# Patient Record
Sex: Female | Born: 1965 | Race: White | Hispanic: No | Marital: Married
Health system: Midwestern US, Academic
[De-identification: ages and names within clinical notes are randomized; demographics above are authoritative.]

---

## 2017-08-10 ENCOUNTER — Encounter: Admit: 2017-08-10 | Discharge: 2017-08-11 | Payer: BC Managed Care – PPO

## 2017-08-10 DIAGNOSIS — R69 Illness, unspecified: Principal | ICD-10-CM

## 2017-08-13 ENCOUNTER — Encounter: Admit: 2017-08-13 | Discharge: 2017-08-14 | Payer: BC Managed Care – PPO

## 2017-08-13 DIAGNOSIS — R69 Illness, unspecified: Principal | ICD-10-CM

## 2017-08-16 ENCOUNTER — Encounter: Admit: 2017-08-16 | Discharge: 2017-08-17 | Payer: BC Managed Care – PPO

## 2017-08-16 DIAGNOSIS — R69 Illness, unspecified: Principal | ICD-10-CM

## 2017-10-31 ENCOUNTER — Encounter: Admit: 2017-10-31 | Discharge: 2017-10-31 | Payer: BC Managed Care – PPO

## 2017-11-20 ENCOUNTER — Encounter: Admit: 2017-11-20 | Discharge: 2017-11-20 | Payer: BC Managed Care – PPO

## 2017-11-21 ENCOUNTER — Encounter: Admit: 2017-11-21 | Discharge: 2017-11-21 | Payer: BC Managed Care – PPO

## 2017-11-22 ENCOUNTER — Encounter: Admit: 2017-11-22 | Discharge: 2017-11-22 | Payer: BC Managed Care – PPO

## 2017-11-22 DIAGNOSIS — R69 Illness, unspecified: Principal | ICD-10-CM

## 2017-11-28 ENCOUNTER — Encounter: Admit: 2017-11-28 | Discharge: 2017-11-29 | Payer: BC Managed Care – PPO

## 2017-11-28 ENCOUNTER — Encounter: Admit: 2017-11-28 | Discharge: 2017-11-28 | Payer: BC Managed Care – PPO

## 2017-11-28 DIAGNOSIS — N19 Unspecified kidney failure: ICD-10-CM

## 2017-11-28 DIAGNOSIS — I10 Essential (primary) hypertension: ICD-10-CM

## 2017-11-28 DIAGNOSIS — D472 Monoclonal gammopathy: Principal | ICD-10-CM

## 2017-11-28 DIAGNOSIS — E119 Type 2 diabetes mellitus without complications: Principal | ICD-10-CM

## 2017-11-28 DIAGNOSIS — N186 End stage renal disease: ICD-10-CM

## 2017-11-28 LAB — CBC AND DIFF
Lab: 12 g/dL — ABNORMAL HIGH (ref 12.0–15.0)
Lab: 6 K/UL (ref 4.5–11.0)

## 2017-11-28 LAB — COMPREHENSIVE METABOLIC PANEL: Lab: 136 MMOL/L — ABNORMAL LOW (ref 137–147)

## 2017-11-28 LAB — IMMUNOGLOBULINS-IGA,IGG,IGM: Lab: 133 mg/dL — ABNORMAL HIGH (ref 762–1488)

## 2017-11-28 LAB — LDH-LACTATE DEHYDROGENASE: Lab: 157 U/L (ref 100–210)

## 2017-11-29 LAB — KAPPA/LAMBDA FREE LIGHT CHAINS: Lab: 12 mg/dL — ABNORMAL HIGH (ref 0.33–1.94)

## 2017-11-29 LAB — BETA 2 MICROGLOBULIN: Lab: 11 mg/L — ABNORMAL HIGH (ref 0.8–2.3)

## 2017-11-30 LAB — IMMUNOFIXATION, SERUM (IFES)

## 2017-11-30 LAB — ELECTROPHORESIS-SERUM PROTEIN: Lab: 7.2 g/dL (ref 6.0–8.0)

## 2017-12-05 ENCOUNTER — Encounter: Admit: 2017-12-05 | Discharge: 2017-12-05 | Payer: BC Managed Care – PPO

## 2017-12-05 DIAGNOSIS — D472 Monoclonal gammopathy: Principal | ICD-10-CM

## 2017-12-05 LAB — URINE COLLECTION
Lab: 25 pg (ref 26–34)
Lab: 800 mL (ref 32.0–36.0)

## 2017-12-06 ENCOUNTER — Encounter: Admit: 2017-12-06 | Discharge: 2017-12-06 | Payer: BC Managed Care – PPO

## 2017-12-07 LAB — ELECTROPHORESIS-UR 24 HR
Lab: 11 %
Lab: 13 %
Lab: 20 %
Lab: 27 %
Lab: 29 %
Lab: 39 mg/dL

## 2017-12-07 LAB — IMMUNOFIXATION URINE 24 HOUR

## 2017-12-10 ENCOUNTER — Encounter: Admit: 2017-12-10 | Discharge: 2017-12-10 | Payer: BC Managed Care – PPO

## 2017-12-10 DIAGNOSIS — N19 Unspecified kidney failure: ICD-10-CM

## 2017-12-10 DIAGNOSIS — E119 Type 2 diabetes mellitus without complications: Principal | ICD-10-CM

## 2017-12-10 DIAGNOSIS — D472 Monoclonal gammopathy: ICD-10-CM

## 2017-12-10 DIAGNOSIS — N186 End stage renal disease: ICD-10-CM

## 2017-12-10 DIAGNOSIS — I10 Essential (primary) hypertension: ICD-10-CM

## 2017-12-20 ENCOUNTER — Encounter: Admit: 2017-12-20 | Discharge: 2017-12-20 | Payer: BC Managed Care – PPO

## 2017-12-28 ENCOUNTER — Encounter: Admit: 2017-12-28 | Discharge: 2017-12-28 | Payer: BC Managed Care – PPO

## 2018-01-15 ENCOUNTER — Encounter: Admit: 2018-01-15 | Discharge: 2018-01-15 | Payer: BC Managed Care – PPO

## 2018-01-15 DIAGNOSIS — N186 End stage renal disease: ICD-10-CM

## 2018-01-15 DIAGNOSIS — D472 Monoclonal gammopathy: ICD-10-CM

## 2018-01-15 DIAGNOSIS — E119 Type 2 diabetes mellitus without complications: Principal | ICD-10-CM

## 2018-01-15 DIAGNOSIS — N19 Unspecified kidney failure: ICD-10-CM

## 2018-01-15 DIAGNOSIS — I10 Essential (primary) hypertension: ICD-10-CM

## 2018-01-15 DIAGNOSIS — E1121 Type 2 diabetes mellitus with diabetic nephropathy: ICD-10-CM

## 2018-01-22 ENCOUNTER — Encounter: Admit: 2018-01-22 | Discharge: 2018-01-22 | Payer: BC Managed Care – PPO

## 2018-01-23 ENCOUNTER — Encounter: Admit: 2018-01-23 | Discharge: 2018-01-23 | Payer: BC Managed Care – PPO

## 2018-01-29 ENCOUNTER — Encounter: Admit: 2018-01-29 | Discharge: 2018-01-29 | Payer: BC Managed Care – PPO

## 2018-03-08 ENCOUNTER — Encounter: Admit: 2018-03-08 | Discharge: 2018-03-08 | Payer: BC Managed Care – PPO

## 2018-03-21 ENCOUNTER — Encounter: Admit: 2018-03-21 | Discharge: 2018-03-21 | Payer: BC Managed Care – PPO

## 2018-03-21 DIAGNOSIS — Z01818 Encounter for other preprocedural examination: Principal | ICD-10-CM

## 2018-03-23 ENCOUNTER — Encounter: Admit: 2018-03-23 | Discharge: 2018-03-23 | Payer: BC Managed Care – PPO

## 2018-03-26 ENCOUNTER — Encounter: Admit: 2018-03-26 | Discharge: 2018-03-26 | Payer: BC Managed Care – PPO

## 2018-04-05 ENCOUNTER — Encounter: Admit: 2018-04-05 | Discharge: 2018-04-05 | Payer: BC Managed Care – PPO

## 2018-04-09 ENCOUNTER — Ambulatory Visit: Admit: 2018-04-09 | Discharge: 2018-04-09 | Payer: BC Managed Care – PPO

## 2018-04-09 ENCOUNTER — Encounter: Admit: 2018-04-09 | Discharge: 2018-04-09 | Payer: BC Managed Care – PPO

## 2018-04-09 ENCOUNTER — Ambulatory Visit: Admit: 2018-04-09 | Discharge: 2018-04-10 | Payer: BC Managed Care – PPO

## 2018-04-09 DIAGNOSIS — R897 Abnormal histological findings in specimens from other organs, systems and tissues: ICD-10-CM

## 2018-04-09 DIAGNOSIS — Z01818 Encounter for other preprocedural examination: Principal | ICD-10-CM

## 2018-04-09 DIAGNOSIS — D472 Monoclonal gammopathy: ICD-10-CM

## 2018-04-09 DIAGNOSIS — E119 Type 2 diabetes mellitus without complications: Principal | ICD-10-CM

## 2018-04-09 DIAGNOSIS — E0821 Diabetes mellitus due to underlying condition with diabetic nephropathy: Secondary | ICD-10-CM

## 2018-04-09 DIAGNOSIS — N19 Unspecified kidney failure: ICD-10-CM

## 2018-04-09 DIAGNOSIS — N186 End stage renal disease: Principal | ICD-10-CM

## 2018-04-09 DIAGNOSIS — I10 Essential (primary) hypertension: ICD-10-CM

## 2018-04-09 DIAGNOSIS — E1122 Type 2 diabetes mellitus with diabetic chronic kidney disease: ICD-10-CM

## 2018-04-09 DIAGNOSIS — Z794 Long term (current) use of insulin: Secondary | ICD-10-CM

## 2018-04-09 DIAGNOSIS — R809 Proteinuria, unspecified: ICD-10-CM

## 2018-04-09 DIAGNOSIS — D649 Anemia, unspecified: ICD-10-CM

## 2018-04-09 LAB — URINALYSIS DIPSTICK
Lab: 1 (ref 1.003–1.035)
Lab: 5 (ref 5.0–8.0)
Lab: NEGATIVE
Lab: NEGATIVE
Lab: NEGATIVE
Lab: NEGATIVE
Lab: POSITIVE — AB

## 2018-04-09 LAB — HEPATITIS C ANTIBODY W REFLEX HCV PCR QUANT: Lab: NEGATIVE mL/min — ABNORMAL LOW (ref 0–0.45)

## 2018-04-09 LAB — CBC AND DIFF
Lab: 0.1 10*3/uL (ref 0–0.20)
Lab: 0.2 10*3/uL (ref 0–0.45)
Lab: 0.6 10*3/uL (ref 0–0.80)
Lab: 12 g/dL (ref 12.0–15.0)
Lab: 14 % (ref 11–15)
Lab: 2 % (ref 0–5)
Lab: 2.6 10*3/uL (ref 1.0–4.8)
Lab: 3.9 M/UL — ABNORMAL LOW (ref 4.0–5.0)
Lab: 30 % (ref 24–44)
Lab: 32 pg (ref 26–34)
Lab: 34 g/dL (ref 32.0–36.0)
Lab: 37 % (ref 36–45)
Lab: 5.3 10*3/uL (ref 1.8–7.0)
Lab: 8.8 10*3/uL (ref 4.5–11.0)
Lab: 9.2 FL — ABNORMAL LOW (ref >60)
Lab: 94 FL (ref 80–100)

## 2018-04-09 LAB — COMPREHENSIVE METABOLIC PANEL
Lab: 101 mg/dL — ABNORMAL HIGH (ref 70–100)
Lab: 138 MMOL/L (ref 137–147)
Lab: 3.3 mg/dL — ABNORMAL HIGH (ref 0.4–1.00)
Lab: 3.4 MMOL/L — ABNORMAL LOW (ref 3.5–5.1)

## 2018-04-09 LAB — AMPHETAMINES-URINE RANDOM: Lab: NEGATIVE

## 2018-04-09 LAB — PHOSPHORUS: Lab: 3.5 mg/dL (ref 2.0–4.5)

## 2018-04-09 LAB — COCAINE-URINE RANDOM: Lab: NEGATIVE

## 2018-04-09 LAB — HEPATITIS B SURFACE AG: Lab: NEGATIVE

## 2018-04-09 LAB — OPIATES-URINE RANDOM: Lab: NEGATIVE

## 2018-04-09 LAB — CANNABINOIDS-URINE RANDOM: Lab: NEGATIVE

## 2018-04-09 LAB — URINALYSIS, MICROSCOPIC

## 2018-04-09 LAB — HEPATITIS B SURFACE AB

## 2018-04-09 LAB — HIV 1& 2 AG-AB SCRN W REFLEX HIV 1 PCR QUANT: Lab: NEGATIVE U/L (ref 7–56)

## 2018-04-09 LAB — TOXOPLASMA IGM: Lab: NEGATIVE % (ref 4–12)

## 2018-04-09 LAB — GGTP: Lab: 13 U/L (ref 9–64)

## 2018-04-09 LAB — BARBITURATES-URINE RANDOM: Lab: NEGATIVE

## 2018-04-09 LAB — BENZODIAZEPINES-URINE RANDOM: Lab: NEGATIVE

## 2018-04-09 LAB — PHENCYCLIDINES-URINE RANDOM: Lab: NEGATIVE

## 2018-04-09 LAB — TOXOPLASMA IGG: Lab: NEGATIVE % — ABNORMAL LOW (ref >60)

## 2018-04-09 LAB — HEPATITIS B CORE AB TOT (IGG+IGM): Lab: NEGATIVE

## 2018-04-09 LAB — PROTIME INR (PT): Lab: 1 g/dL (ref 0.8–1.2)

## 2018-04-10 ENCOUNTER — Encounter: Admit: 2018-04-10 | Discharge: 2018-04-10 | Payer: BC Managed Care – PPO

## 2018-04-10 DIAGNOSIS — N186 End stage renal disease: Principal | ICD-10-CM

## 2018-04-10 LAB — CMV AB IGG: Lab: NEGATIVE mg/dL — ABNORMAL HIGH (ref 7–25)

## 2018-04-10 LAB — CMV AB IGM: Lab: NEGATIVE MMOL/L (ref 98–110)

## 2018-04-10 LAB — SYPHILIS AB SCREEN: Lab: NEGATIVE K/UL (ref 150–400)

## 2018-04-10 LAB — EPSTEIN BARR  PANEL(EBV)
Lab: POSITIVE
Lab: POSITIVE

## 2018-04-10 LAB — HERPES SIMPLEX IGG AB (HSV IGG)
Lab: NEGATIVE MMOL/L — ABNORMAL HIGH (ref 21–30)
Lab: POSITIVE U/L — AB (ref 7–40)

## 2018-04-11 ENCOUNTER — Encounter: Admit: 2018-04-11 | Discharge: 2018-04-11 | Payer: BC Managed Care – PPO

## 2018-04-11 DIAGNOSIS — D649 Anemia, unspecified: ICD-10-CM

## 2018-04-11 DIAGNOSIS — N19 Unspecified kidney failure: ICD-10-CM

## 2018-04-11 DIAGNOSIS — N186 End stage renal disease: ICD-10-CM

## 2018-04-11 DIAGNOSIS — E119 Type 2 diabetes mellitus without complications: Principal | ICD-10-CM

## 2018-04-11 DIAGNOSIS — R897 Abnormal histological findings in specimens from other organs, systems and tissues: ICD-10-CM

## 2018-04-11 DIAGNOSIS — R809 Proteinuria, unspecified: ICD-10-CM

## 2018-04-11 DIAGNOSIS — D472 Monoclonal gammopathy: ICD-10-CM

## 2018-04-11 DIAGNOSIS — I10 Essential (primary) hypertension: ICD-10-CM

## 2018-04-11 LAB — HEMOGLOBIN A1C: Lab: 10 % — ABNORMAL HIGH (ref 4.0–6.0)

## 2018-04-12 ENCOUNTER — Encounter: Admit: 2018-04-12 | Discharge: 2018-04-12 | Payer: BC Managed Care – PPO

## 2018-04-18 ENCOUNTER — Encounter: Admit: 2018-04-18 | Discharge: 2018-04-18 | Payer: BC Managed Care – PPO

## 2018-04-19 ENCOUNTER — Encounter: Admit: 2018-04-19 | Discharge: 2018-04-19 | Payer: BC Managed Care – PPO

## 2018-04-24 ENCOUNTER — Encounter: Admit: 2018-04-24 | Discharge: 2018-04-24 | Payer: BC Managed Care – PPO

## 2018-05-10 ENCOUNTER — Encounter: Admit: 2018-05-10 | Discharge: 2018-05-10 | Payer: BC Managed Care – PPO

## 2018-05-15 ENCOUNTER — Encounter: Admit: 2018-05-15 | Discharge: 2018-05-15 | Payer: BC Managed Care – PPO

## 2018-06-01 ENCOUNTER — Encounter: Admit: 2018-06-01 | Discharge: 2018-06-01 | Payer: BC Managed Care – PPO

## 2018-06-13 ENCOUNTER — Encounter: Admit: 2018-06-13 | Discharge: 2018-06-13 | Payer: BC Managed Care – PPO

## 2018-06-20 ENCOUNTER — Encounter: Admit: 2018-06-20 | Discharge: 2018-06-20 | Payer: BC Managed Care – PPO

## 2018-06-22 ENCOUNTER — Encounter: Admit: 2018-06-22 | Discharge: 2018-06-22 | Payer: BC Managed Care – PPO

## 2018-06-25 ENCOUNTER — Encounter: Admit: 2018-06-25 | Discharge: 2018-06-25 | Payer: BC Managed Care – PPO

## 2018-06-26 ENCOUNTER — Encounter: Admit: 2018-06-26 | Discharge: 2018-06-26 | Payer: BC Managed Care – PPO

## 2018-06-27 ENCOUNTER — Encounter: Admit: 2018-06-27 | Discharge: 2018-06-27 | Payer: BC Managed Care – PPO

## 2018-07-02 ENCOUNTER — Encounter: Admit: 2018-07-02 | Discharge: 2018-07-02 | Payer: BC Managed Care – PPO

## 2018-07-03 ENCOUNTER — Encounter: Admit: 2018-07-03 | Discharge: 2018-07-03 | Payer: BC Managed Care – PPO

## 2018-07-05 ENCOUNTER — Encounter: Admit: 2018-07-05 | Discharge: 2018-07-05 | Payer: BC Managed Care – PPO

## 2018-07-10 ENCOUNTER — Encounter: Admit: 2018-07-10 | Discharge: 2018-07-10 | Payer: BC Managed Care – PPO

## 2018-07-11 ENCOUNTER — Encounter: Admit: 2018-07-11 | Discharge: 2018-07-11 | Payer: BC Managed Care – PPO

## 2018-07-19 ENCOUNTER — Encounter: Admit: 2018-07-19 | Discharge: 2018-07-19 | Payer: BC Managed Care – PPO

## 2018-07-20 ENCOUNTER — Encounter: Admit: 2018-07-20 | Discharge: 2018-07-20 | Payer: BC Managed Care – PPO

## 2018-07-26 ENCOUNTER — Ambulatory Visit: Admit: 2018-07-26 | Discharge: 2018-07-27 | Payer: BC Managed Care – PPO

## 2018-07-27 ENCOUNTER — Encounter: Admit: 2018-07-27 | Discharge: 2018-07-27 | Payer: BC Managed Care – PPO

## 2018-07-27 DIAGNOSIS — N186 End stage renal disease: Principal | ICD-10-CM

## 2018-07-31 ENCOUNTER — Encounter: Admit: 2018-07-31 | Discharge: 2018-07-31 | Payer: BC Managed Care – PPO

## 2018-09-13 ENCOUNTER — Encounter: Admit: 2018-09-13 | Discharge: 2018-09-13 | Payer: BC Managed Care – PPO

## 2018-10-25 ENCOUNTER — Encounter: Admit: 2018-10-25 | Discharge: 2018-10-25 | Payer: BC Managed Care – PPO

## 2018-11-22 ENCOUNTER — Encounter: Admit: 2018-11-22 | Discharge: 2018-11-22 | Payer: BC Managed Care – PPO

## 2018-11-22 NOTE — Telephone Encounter
TRANSPLANT BENEFIT COLLECTION:  As of 11/22/18 Patient does not have Medicare per WPS website    Verified by: Leodis Rains      Date: November 22, 2018  Spoke to: 2020 Benefits compared to 2019 Benefits no changes via [employer] called for RX Benefit  ID #: [id number]   GR#: [id number]    Subscriber:Spouse  Ins Plan:[place]    EFF:06/01/18    Phone#:[phone] Plan Type:PPO  Deductible:???$1000(Met)??????????????????  Co-ins:???10%????????????????????????????????????  Out of Pocket:???$2500 (Med/RX)(Met)  Inpt Copay:???Sub to Co-Ins  Outpt Copay:???Sub to Co-Ins  OV PCP/Spec Copay:???$30 / $45  Additional Benefit Limits:???[place] Is In-Network NO BDCT is required  Plan limitations/concerns: N/A  Passport Onesource Ref#:  [id number]    Donor Benefits  Max Donor Benefits:???Covered  Travel and Lodging for recipient.???None  Donor Travel and Lodging for Donor:???None  ???  RX Plan:BCBS / Express Scripts????????????Phone #:???[phone]  Spoke to: Janeece          Call Reference#: [id number]  30 day retail cost:???$15 / $30 / $50  90 day m/o cost:???$37.50 / $75 / $125  Valcyte:???$50 / 90day m/o $125  Generic:???$15 / 90day m/o $37.50  Envarsus XR:???$50 / 90day m/o $125  RX Ded:???$0  RX OOP:???$2500 Rx/Med(Met)  ???  TXP Network:[employer] [place]  NCM:???Eustace Pen  Phone #:???[phone]??????????????????FAX #:???[phone]  Auth requirements:No Auth Needed for Eval. Written Auth Required for Listing  ???

## 2019-03-18 ENCOUNTER — Ambulatory Visit: Admit: 2019-03-18 | Discharge: 2019-03-19 | Payer: BC Managed Care – PPO

## 2019-03-19 DIAGNOSIS — N186 End stage renal disease: Principal | ICD-10-CM

## 2019-03-22 ENCOUNTER — Encounter: Admit: 2019-03-22 | Discharge: 2019-03-22

## 2019-04-15 ENCOUNTER — Encounter: Admit: 2019-04-15 | Discharge: 2019-04-15 | Payer: BC Managed Care – PPO

## 2019-04-15 NOTE — Telephone Encounter
Called pt to try and reschedule her appt no answer left message for pt to call back.

## 2019-04-22 ENCOUNTER — Encounter: Admit: 2019-04-22 | Discharge: 2019-04-22 | Payer: BC Managed Care – PPO

## 2019-04-22 NOTE — Telephone Encounter
Patient being seen in clinic for update visit on 04/30/2019, EKG and CT abdomen/pelvis ordered.

## 2019-04-30 ENCOUNTER — Encounter

## 2019-04-30 DIAGNOSIS — D649 Anemia, unspecified: Secondary | ICD-10-CM

## 2019-04-30 DIAGNOSIS — E119 Type 2 diabetes mellitus without complications: Secondary | ICD-10-CM

## 2019-04-30 DIAGNOSIS — Z01818 Encounter for other preprocedural examination: Secondary | ICD-10-CM

## 2019-04-30 DIAGNOSIS — R897 Abnormal histological findings in specimens from other organs, systems and tissues: Secondary | ICD-10-CM

## 2019-04-30 DIAGNOSIS — N186 End stage renal disease: Secondary | ICD-10-CM

## 2019-04-30 DIAGNOSIS — R809 Proteinuria, unspecified: Secondary | ICD-10-CM

## 2019-04-30 DIAGNOSIS — I10 Essential (primary) hypertension: Secondary | ICD-10-CM

## 2019-04-30 DIAGNOSIS — N19 Unspecified kidney failure: Secondary | ICD-10-CM

## 2019-04-30 DIAGNOSIS — D472 Monoclonal gammopathy: Secondary | ICD-10-CM

## 2019-04-30 LAB — CBC AND DIFF
Lab: 0 10*3/uL (ref 0–0.20)
Lab: 0.2 10*3/uL (ref 0–0.45)
Lab: 0.6 10*3/uL (ref 0–0.80)
Lab: 1 % (ref 0–2)
Lab: 10 10*3/uL (ref 4.5–11.0)
Lab: 12 g/dL (ref 12.0–15.0)
Lab: 13 % (ref 11–15)
Lab: 2 % (ref 0–5)
Lab: 2.8 10*3/uL (ref 1.0–4.8)
Lab: 228 10*3/uL (ref 150–400)
Lab: 27 % (ref 24–44)
Lab: 29 pg (ref 26–34)
Lab: 34 g/dL — ABNORMAL HIGH (ref 32.0–36.0)
Lab: 37 % (ref 36–45)
Lab: 4.4 M/UL (ref 4.0–5.0)
Lab: 6 % (ref 4–12)
Lab: 6.8 10*3/uL (ref 1.8–7.0)
Lab: 64 % — ABNORMAL LOW (ref >60)
Lab: 85 FL (ref 80–100)
Lab: 9.2 FL — ABNORMAL LOW (ref >60)

## 2019-04-30 LAB — COMPREHENSIVE METABOLIC PANEL
Lab: 100 mg/dL (ref 70–100)
Lab: 138 MMOL/L (ref 137–147)
Lab: 2.7 mg/dL — ABNORMAL HIGH (ref 0.4–1.00)
Lab: 3.1 MMOL/L — ABNORMAL LOW (ref 3.5–5.1)

## 2019-04-30 LAB — URINALYSIS DIPSTICK
Lab: NEGATIVE
Lab: NEGATIVE
Lab: NEGATIVE
Lab: NEGATIVE

## 2019-04-30 LAB — HIV 1& 2 AG-AB SCRN W REFLEX HIV 1 PCR QUANT

## 2019-04-30 LAB — PROTEIN/CR RATIO,UR RAN
Lab: 0.1
Lab: 15 mg/dL
Lab: 155 mg/dL — AB (ref 0–5)

## 2019-04-30 LAB — HEPATITIS B SURFACE AG

## 2019-04-30 LAB — PHOSPHORUS: Lab: 3.4 mg/dL (ref 2.0–4.5)

## 2019-04-30 LAB — URINALYSIS, MICROSCOPIC

## 2019-04-30 LAB — TOXOPLASMA IGM: Lab: NEGATIVE

## 2019-04-30 LAB — GGTP: Lab: 34 U/L (ref 9–64)

## 2019-04-30 LAB — PROTIME INR (PT): Lab: 1.1 (ref 0.8–1.2)

## 2019-04-30 LAB — TOXOPLASMA IGG: Lab: NEGATIVE

## 2019-04-30 NOTE — Progress Notes
Kidney Transplant Nutrition Evaluation    Impressions: Patient has uncontrolled blood sugars with a HbgA1c of 9.3% and am blood glucose between 200-400mg /dL. Otherwise, I see no nutritional contraindications to transplantation at this time. Pt has an appropriate body habitus for RTx.   BMI Readings from Last 1 Encounters:   04/30/19 36.51 kg/m?                                                Nutrition Assessment of Patient:  Jacqueline Shields is a 53 y.o. 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 Spoke with spouse during transplant evaluation today.      Weight History:  240 pounds, stable  Dialysis:  Peritoneal Dialysis   Dialysis since 08/2017, PD since May 2019  Diabetes:  Type 2 Diabetes  Checks blood sugars once a day in the morning, they range between 200-400mg /dL. She thinks her last HbgA1c is 9.5%  Diet Recall:   Diet coke and 2 sugar free jello, dinner was mini tacos and sprite. Occasionally she will have bananas and jello for breakfast.   Fluid Intake:   Diet coke, sprite, water. Typically goes over her fluid restriction daily  GI symptoms:  Nausea in the monring  Pertinent Meds/Supplements:   n/a    Wt Readings from Last 5 Encounters:   04/30/19 108.9 kg (240 lb 1.6 oz)   04/09/18 109 kg (240 lb 3.2 oz)   04/09/18 109 kg (240 lb 3.2 oz)   01/15/18 110.5 kg (243 lb 9.6 oz)   11/28/17 101.9 kg (224 lb 9.6 oz)         Pertinent Labs:   Phosphorus   Date Value Ref Range Status   04/09/2018 3.5 2.0 - 4.5 MG/DL Final     Comment:     NOTE NEW REFERENCE RANGES     Lab Results   Component Value Date/Time    HGBA1C 10.3 (H) 04/09/2018 04:04 PM         Nutrition Monitoring/Education and Evaluation:  Diet Compliance: Needs improvement  Education: Instructed on consistent CHO diet. Recommended pt consume 60g CHO/meal * 3 meals/day. Suggested pt avoid snacking between meals. Reviewed 3 classes of nutrients: CHO, fats, protein.  Encouraged pt to choose non-starchy vegetables with as many meals/day as possible. With regards to [facility], recommended 100% whole grains, high fiber starches, low-fat/fat-free dairy products and whole fruit. Encouraged pt to avoid fruit juice and soda pop . Stressed importance of portion management when choosing carbohydrates. Handouts provided detailing recommendations. Pt agreeable to suggested dietary changes. Anticipate poor compliance.      Recommendations: Glycemic Control    Instructed on post-transplant nutrition related expectations. Discussed importance of hand hygiene, food safety, grapefruit avoidance.      Lajuana Carry, MS, RD, CSR, LD    Office: [phone]   [place]: [phone]

## 2019-04-30 NOTE — Progress Notes
[facility]NUAL Recipient Evaluation [facility]    Date of Service: 04/30/19    Jacqueline Shields  [id number]  1966/06/22    Referring Nephrologist:  Elmer Bales  [address]  [place] [address]  Phone: [phone]  Fax: [phone]     Dear Dr. Elmer Bales    We had the pleasure of meeting Jacqueline Shields in the [facility] for her ANNUAL evaluation for continued candidacy for renal transplantation.    She is a 53 y.o. 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     She was initially evaluated on 04/09/18 and has been listed for transplant since 11/10/17 backdated for dialysis.     She was last seen for evaluation at our transplant center during initial eval, at that time noted to be doing well.     Since then Doing well. No issues or concerns.    She denies hospitalizations, illnesses, fevers, chills, n/v/d, chest pains, sob, abd pains, swelling, rash, dysuria, hematuria. Overall feeling well.    DIALYSIS HISTORY:  ESRD diagnosis: DM  Dialysis Initiation Date: Jan 2019  Modality: PD  Prescription: 9.5 hrs, FV , No last fill.   PD Complications: None   Requires Midodrine: No    DIABETES HISTORY:  Age at diagnosis: ~29  Current HgA1C: 9.5 in June 2020  Historical HgA1C: 10-12s  Complications:  --- denies retinopathy  --- reports peripheral neuropathy  --- denies gastroparesis  --- denies hx of DKA  --- denies delayed healing of foot ulcers  --- denies hypoglycemic unawareness  --- denies amputations    From prior note:  She was diagnosed with ESRD in January 2019 after presenting to the hospital with flu like symptoms and was found to have a serum Creatinine of >12 mg/dl. She did follow with a PCP on a regular basis and does not recall being told that she had declined function but did have proteinuria in 2018.     Personal history:   CVA: denies Neurologic disease: denies migraines, seizure disorder, depression or anxiety  Coronary artery or other heart disease: None  Lung disease: denies history of asthma or COPD  Sleep Apnea: denies symptoms consistent with OSA.  Liver disease: None  Autoimmune disease: denies joint pains/swelling or rash consistent with rheumatologic diseases  Hematologic disorders: denies history of DVT/PEs or bleeding diathesis   PVD: denies symptoms consistent with peripheral vascular disease  Cancer: No history of cancers  Infections: No history of recurrent infections  H/o Kidney stones: Yes requiring lithotripsy in 2018. One in 2019   - follows with urology.  Activity/Exercise: works full time.   NSAID/Narcotic use: None  Native UOP: 500 cc /day  Neck ROM: normal range of motion  Sensitizations: 2 pregnancies, 1 transfusions 2018 during hospitalization when ESRD found.   Currently or Previously listed for transplant at another institution: No    Tobacco: None  Etoh None  Drug abuse: None  Travel outside of [place]: None    Fam Hx of CKD/ESRD: None    REVIEW OF SYSTEMS: Comprehensive 14-point ROS reviewed  Positives noted in HPI otherwise negative.    Allergies:   No Known Allergies    Past Medical History:  Medical History:   Diagnosis Date   ? Abnormal biopsy of kidney    ? Anemia    ? Diabetes mellitus (HCC)    ?  ESRD (end stage renal disease) (HCC)    ? Hypertension    ? Kidney failure    ? MGUS (monoclonal gammopathy of unknown significance)    ? Proteinuria        Social History:  Social History     Socioeconomic History   ? Marital status: Married     Spouse name: Not on file   ? Number of children: Not on file   ? Years of education: Not on file   ? Highest education level: Not on file   Occupational History   ? Not on file   Tobacco Use   ? Smoking status: Never Smoker   ? Smokeless tobacco: Never Used   Substance and Sexual Activity   ? Alcohol use: Not Currently     Frequency: Never   ? Drug use: Never ? Sexual activity: Not on file   Other Topics Concern   ? Not on file   Social History Narrative   ? Not on file       Surgical History:  Surgical History:   Procedure Laterality Date   ? CATHETER IMPLANT/REVISION      PD cath   ? HX HYSTERECTOMY     ? HX LITHOTRIPSY       Family History:  Family History   Problem Relation Age of Onset   ? Diabetes Mother    ? Hypertension Mother    ? Diabetes Father    ? Hypertension Father    ? Cancer-Hematologic Father    ? Diabetes Sister    ? Migraines Sister    ? Cancer Sister         Endometrial       Problem List:  Patient Active Problem List    Diagnosis Date Noted   ? 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 Hypertension    ? ESRD (end stage renal disease) (HCC)    ? Diabetes mellitus (HCC)        Current Medications:    Current Outpatient Medications:   ?  aspirin EC 81 mg tablet, Take 81 mg by mouth daily. Take with food., Disp: , Rfl:   ?  calcium acetate (PHOSLO) 667 mg capsule, Take 667 mg by mouth three times daily., Disp: , Rfl: 11  ?  fenofibrate micronized (LOFIBRA) 134 mg capsule, Take 134 mg by mouth daily., Disp: , Rfl: 1  ?  gentamicin 0.1 % topical cream, Apply  topically to affected area daily as needed., Disp: , Rfl:   ?  insulin glargine (LANTUS) 100 unit/mL injection, Inject 90 Units under the skin daily., Disp: , Rfl:   ?  metoprolol XL (TOPROL XL) 100 mg extended release tablet, Take 100 mg by mouth daily., Disp: , Rfl: 11  ?  nateglinide (STARLIX) 60 mg tablet, Take 60 mg by mouth three times daily before meals., Disp: , Rfl: 3  ?  NIFEdipine SR (PROCARDIA-XL; ADALAT CC) 60 mg tablet, Take 60 mg by mouth daily., Disp: , Rfl: 0  ?  TRULICITY 1.5 mg/0.5 mL injection pen, Inject 1.5 mg under the skin every 7 days. Saturday, Disp: , Rfl: 1    Physical Exam:  BP 102/78 (BP Source: Arm, Right Upper, Patient Position: Standing)  - Pulse 76  - Ht 172.7 cm (68)  - Wt 108.9 kg (240 lb 1.6 oz)  - LMP  (LMP Unknown)  - SpO2 100%  - BMI 36.51 kg/m?   Body mass index is 36.51  kg/m?.  General: In NAD; A&Ox3, well appearing  Skin: Warm, dry, no signs of rash   HEENT: Grossly normal appearing; EOMI; dentition good  Neck: No bruits  CV: Regular, regular rate, normal S1/S2, no murmurs  Lungs: CTA bilaterally in posterior fields  Abd: Grossly normal without rebound, guarding, masses, or bruits  Ext: No edema; femoral pulse 1-2+ and symmetric  Neuro: No focal deficits   Psych: Affect appropriate  Dialysis Access: PD catheter looks good     Laboratory studies:   CMP:  CMP Latest Ref Rng & Units 04/09/2018 11/28/2017   NA 137 - 147 MMOL/L 138 136(L)   K 3.5 - 5.1 MMOL/L 3.4(L) 3.7   CL 98 - 110 MMOL/L 99 100   CO2 21 - 30 MMOL/L 30 29   GAP 3 - 12 9 7    BUN 7 - 25 MG/DL 16(X) 09(U)   CR 0.4 - 1.00 MG/DL 0.45(W) 0.98(J)   GLUX 70 - 100 MG/DL 191(Y) 782(N)   CA 8.5 - 10.6 MG/DL 9.4 9.3   TP 6.0 - 8.0 G/DL 7.8 7.4   ALB 3.5 - 5.0 G/DL 4.1 4.0   ALKP 25 - 562 U/L 53 42   ALT 7 - 56 U/L 14 9   TBILI 0.3 - 1.2 MG/DL 0.4 0.4   GFR >13 mL/min 14(L) 10(L)   GFRAA >60 mL/min 17(L) 12(L)     CBC with Diff:  CBC with Diff Latest Ref Rng & Units 04/09/2018 11/28/2017   WBC 4.5 - 11.0 K/UL 8.8 6.0   RBC 4.0 - 5.0 M/UL 3.97(L) 4.24   HGB 12.0 - 15.0 GM/DL 08.6 57.8   HCT 36 - 45 % 37.4 38.1   MCV 80 - 100 FL 94.2 89.8   MCH 26 - 34 PG 32.2 29.7   MCHC 32.0 - 36.0 G/DL 46.9 62.9   RDW 11 - 15 % 14.8 15.7(H)   PLT 150 - 400 K/UL 317 282   MPV 7 - 11 FL 9.2 8.8   NEUT 41 - 77 % 60 54   ANC 1.8 - 7.0 K/UL 5.30 3.30   LYMA 24 - 44 % 30 34   ALYM 1.0 - 4.8 K/UL 2.60 2.00   MONA 4 - 12 % 7 7   AMONO 0 - 0.80 K/UL 0.60 0.40   EOSA 0 - 5 % 2 4   AEOS 0 - 0.45 K/UL 0.20 0.20   BASA 0 - 2 % 1 1   ABAS 0 - 0.20 K/UL 0.10 0.10       Urine    Lab Results   Component Value Date/Time    UCOLOR YELLOW 04/09/2018 04:24 PM    TURBID CLEAR 04/09/2018 04:24 PM    USPGR 1.015 04/09/2018 04:24 PM    UPH 5.0 04/09/2018 04:24 PM    UAGLU 3+ (A) 04/09/2018 04:24 PM UPROTEIN 1+ (A) 04/09/2018 04:24 PM    UBLD NEG 04/09/2018 04:24 PM    URBC 0-2 04/09/2018 04:24 PM    Lab Results   Component Value Date/Time    UWBC 10-20 04/09/2018 04:24 PM    UNIT NEG 04/09/2018 04:24 PM    ULEU TRACE (A) 04/09/2018 04:24 PM    UKET NEG 04/09/2018 04:24 PM    UBILE NEG 04/09/2018 04:24 PM    UROB NORMAL 04/09/2018 04:24 PM        Other Common Labs:  Other Common Labs Latest Ref Rng & Units 04/09/2018   PO4 2.0 -  4.5 MG/DL 3.5   UPRO NEG-NEG 1+(A)   HBA1C 4.0 - 6.0 % 10.3(H)     Viral Serologies:  Viral Serologies Latest Ref Rng & Units 04/09/2018   CMV IgG - NEG   CMV IgM NEG-NEG NEG   EBV Capsid IgG - POS   EBV Capsid IgM NEG-NEG NEG   EBV Nuclear AG, AB - POS   EBV Early AG,AB - POS   HSV Type 2 IgG NEG-NEG NEG   HIV 1 & 2 AG, AB NEG-NEG NEG       Imaging:     Results for orders placed during the hospital encounter of 04/09/18   CHEST 2 VIEWS    Impression No acute cardiopulmonary process.       Finalized by Delmar Landau, M.D. on 04/09/2018 4:32 PM. Dictated by Delmar Landau, M.D. on 04/09/2018 4:30 PM.         Assessment and Plan:  Jacqueline Shields is a 53 y.o. who appears stated age Caucasian female presents today for her ongoing evaluation for continued candidacy for renal transplantation. She blood type is A POS. Last cPRA 0% on 07/10/18. She has had 0 offers since listing.    Ultimately, I believe that she is a(n) reasonable candidate for transplantation. Her PMH is sig for ESRD on PD 2/2 DM2 bx proven and recurrent renal stones last 2 years ago. Medically, she carries no known absolute contraindications for transplant such as active cancer, infection or substance use. She has no known significant CAD, reduced EF or compromising valvular disease which were last evaluated by Echo in Jan 2019 and Stress in Jan 2019. In view of transplant evaluation, her interview and physical examination were remarkable for central obesity. At the time of this note, the available electronic medical records for review are unremarkable other than what is noted. In review of the available results/laboratories from our clinic visit, they are pending.    At this time, she has the following potential living donors: None.    Potential barriers for transplantation that include:  Multiple medical comorbidites and Body habitus, additionally will need the following.    Prior to transplantation,   #) Routine pre-transplant labs updated yearly, if not already obtained, including coags, routine chemistries, toxicology, viral serologies, toxoplasma/syphilis, histocompatibilty  #) The need for a repeat/updated CT Abdomen/pelvis for anatomic suitability to be determined by transplant surgery  #) UPCR / Hgb A1c   #) yearly echo and stress, will need update this year  #) tighter BG control, will need a1c < 8.5   #) weight loss, BMI of 36, goal set by transplant surgery.   #) pap smear records from PCP, if not done, will obtain one    - Dr Leron Croak at [facility]. [address] [place], [place], [place] [address] - Phone: [phone]  #) Mammogram records from [facility] done this year  #) will benefit from stone analysis or stone treatment post transplant.     Cardiovascular Screening  Echo 08/2017: EF 64%, grade II diastolic dysfunction, ascending aorta 3.3 cm. RSVP  Lexiscan Stress test 08/2017: Normal. EF 64%. SR resting EKG.  Left Heart Catheterization: LHC     Cancer Screening  Colonoscopy: Date 04/18/18 - Good prep, 0 polyps, repeat in 10 years  Mammogram: Date 11/16/17 - Normal   PAP: May not be indicated s/p ??partial hysterectomy in 2000, will need records for review.    The kidney allocation system, KDPI, and EPTS education was reviewed  with Jacqueline Shields.    We discussed the risks, benefits, and complications of renal transplantation with the patient. Deceased donor and living donor transplantation and wait times were also discussed. I explained the in-hospital course after transplantation, as well as the post-transplant follow-up and the absolute requirement for lifelong administration of immunosuppressant medications. The use, compliance with meds and side effects of immunosuppression including malignancy and infection were also discussed. She demonstrated a full and adequate understanding. She denied any questions or concerns at this time.    The patient was informed that higher KDPI (>85%) kidneys may incur some small additional risks compared to an standard criteria donor (SCD or KDPI below 85%) kidney.     Jacqueline Shields was informed they have a right to refuse any kidney that is offered to them for transplant without affecting their status on the wait list.    Aside from myself, she will be evaluated by a pre-transplant nurse coordinators, dietician, pharmacist, social worker and Artist.     The patient was informed that if her medical condition changes or she does not pass the medical/surgical testing protocol, transplantation will not be an option for the treatment of her renal disease.     Thank you for giving [place] the opportunity in taking care of this patient.  Please do not hesitate to contact us with any questions or concerns that you may have.    Yours sincerely,    Dorna Mai, MD   Kidney and Pancreas Transplant    Cc: Elmer Bales  Cc: Leron Croak    Please contact the [facility] at  [phone] for any transplant related questions or concerns that may arise.

## 2019-04-30 NOTE — Progress Notes
Patient seen by:   Mollie Germany LMSW: reinforced diabetes compliance, husband is good support plan, back up support is brother (he came today), no mental health or substance abuse concerns.  Hermina Barters RD: patient stated she only checks blood sugars a few times a week, weight has remained the same, no concerns from body habitus perspective, believes a1c is elevated at 9.5%.   Mayra Magana TFA: FAP update completed in the room, approved, no financial concerns   Dr. Lenice Llamas: check a1c level and UPCR today, work on losing weight/not a barrier, needs notes for hysterectomy (partial vs total?) then check on pap. Obtain mammogram report. Needs to update echocardiogram and stress test.   Dr. Juleen Starr: suitable candidate, needs to work on glucose control and a1c, does not need updated CT scan         You were seen by members of the Pre Renal/Pancreas Transplant Team today who has recommended that you complete the following:     Chest x-ray (today)    Lab work (today)   Echo   Stress Test    Improve hgb a1c/glucose control     Please keep your Nurse Coordinator informed of ANY changes in your health and insurance status.    We recommend that you sign up for MyChart. The activation code has been provided to you in your after visit summary for today's visit.     It was a pleasure to see you today. Thank you for partnering with [facility] for your care.     If you should have any questions or concerns, please feel free to contact us.     Sueanne Margarita RN, BSN  Pre Renal/Pancreas Nurse Coordinator  [facility]   [phone] [email or web address]

## 2019-05-01 ENCOUNTER — Encounter: Admit: 2019-05-01 | Discharge: 2019-05-01 | Payer: BC Managed Care – PPO

## 2019-05-01 DIAGNOSIS — N186 End stage renal disease: Secondary | ICD-10-CM

## 2019-05-01 LAB — AMPHETAMINES-URINE RANDOM: Lab: NEGATIVE

## 2019-05-01 LAB — HERPES SIMPLEX IGG AB (HSV IGG)
Lab: NEGATIVE
Lab: POSITIVE — AB

## 2019-05-01 LAB — HEPATITIS B SURFACE AB: Lab: NEGATIVE

## 2019-05-01 LAB — OPIATES-URINE RANDOM: Lab: NEGATIVE

## 2019-05-01 LAB — BARBITURATES-URINE RANDOM: Lab: NEGATIVE

## 2019-05-01 LAB — EPSTEIN BARR  PANEL(EBV)
Lab: POSITIVE
Lab: POSITIVE

## 2019-05-01 LAB — CANNABINOIDS-URINE RANDOM: Lab: NEGATIVE

## 2019-05-01 LAB — HEPATITIS C ANTIBODY W REFLEX HCV PCR QUANT

## 2019-05-01 LAB — BENZODIAZEPINES-URINE RANDOM: Lab: NEGATIVE

## 2019-05-01 LAB — CMV AB IGG: Lab: NEGATIVE mg/dL — ABNORMAL HIGH (ref 7–25)

## 2019-05-01 LAB — CMV AB IGM: Lab: NEGATIVE MMOL/L — ABNORMAL LOW (ref 98–110)

## 2019-05-01 LAB — HEMOGLOBIN A1C: Lab: 10 % — ABNORMAL HIGH (ref 4.0–6.0)

## 2019-05-01 LAB — COCAINE-URINE RANDOM: Lab: NEGATIVE

## 2019-05-01 LAB — HEPATITIS B CORE AB TOT (IGG+IGM)

## 2019-05-01 LAB — SYPHILIS AB SCREEN: Lab: NEGATIVE

## 2019-05-01 LAB — PHENCYCLIDINES-URINE RANDOM: Lab: NEGATIVE

## 2019-05-02 ENCOUNTER — Encounter: Admit: 2019-05-02 | Discharge: 2019-05-02 | Payer: BC Managed Care – PPO

## 2019-05-02 DIAGNOSIS — N186 End stage renal disease: Secondary | ICD-10-CM

## 2019-05-02 DIAGNOSIS — Z01818 Encounter for other preprocedural examination: Secondary | ICD-10-CM

## 2019-05-02 NOTE — Telephone Encounter
Order entry for cardiac testing.

## 2019-05-16 ENCOUNTER — Encounter: Admit: 2019-05-16 | Discharge: 2019-05-16 | Payer: BC Managed Care – PPO

## 2019-05-17 ENCOUNTER — Encounter: Admit: 2019-05-17 | Discharge: 2019-05-17 | Payer: BC Managed Care – PPO

## 2019-05-21 ENCOUNTER — Encounter: Admit: 2019-05-21 | Discharge: 2019-05-21 | Payer: BC Managed Care – PPO

## 2019-05-21 NOTE — Telephone Encounter
Coordinator received fax from [place] stating that auth from echocardiogram and stress test has been authorized. Auth number: [id number] for reference ID: [id number]. Coordinator then called [place] scheduling to notify them of the authorization so patient can be scheduled for her testing. The scheduler took down my name and the auth number, she stated she will call patient to schedule testing today. Auth from [place] through [place] given to MA to scan into chart.

## 2019-05-22 ENCOUNTER — Encounter: Admit: 2019-05-22 | Discharge: 2019-05-22 | Payer: BC Managed Care – PPO

## 2019-05-24 ENCOUNTER — Encounter: Admit: 2019-05-24 | Discharge: 2019-05-24 | Payer: BC Managed Care – PPO

## 2019-05-29 ENCOUNTER — Encounter: Admit: 2019-05-29 | Discharge: 2019-05-29 | Payer: BC Managed Care – PPO

## 2019-05-29 NOTE — Telephone Encounter
Coordinator received voicemail from patient, she stated that she is now scheduled for her cardiac testing on 06/04/2019 with [facility] and that her a1c is down to 9.6%. Coordinator will request results after 06/04/2019 to review with Dr. Lenice Llamas.

## 2019-06-04 ENCOUNTER — Encounter: Admit: 2019-06-04 | Discharge: 2019-06-04 | Payer: BC Managed Care – PPO

## 2019-06-04 ENCOUNTER — Ambulatory Visit: Admit: 2019-06-04 | Discharge: 2019-06-04 | Payer: BC Managed Care – PPO

## 2019-06-04 DIAGNOSIS — I10 Essential (primary) hypertension: Secondary | ICD-10-CM

## 2019-06-04 DIAGNOSIS — N186 End stage renal disease: Secondary | ICD-10-CM

## 2019-06-13 ENCOUNTER — Encounter: Admit: 2019-06-13 | Discharge: 2019-06-13 | Payer: BC Managed Care – PPO

## 2019-06-28 ENCOUNTER — Encounter: Admit: 2019-06-28 | Discharge: 2019-06-28 | Payer: BC Managed Care – PPO

## 2019-06-28 NOTE — Telephone Encounter
Received organ offer from [facility] number [id number] / [employer] number [phone].  Reviewed donor summary and match run list.  Organ donor has KDPI of 78%, patient notified.  Organ offer is donation after circulatory death.  Reviewed recipients informed consent visit form.  Jacqueline Shields consented to [name] less thant 85%.  Jacqueline Shields is back up status.  Reviewed recipient's history.  Notified [place] of organ offer, pt did not answer x8 attempts over one hour timeframe. Discussed with Dr. Earney Hamburg and will skip pt at this time. Will notify pt's primary NC, Hillary.

## 2019-07-31 ENCOUNTER — Encounter: Admit: 2019-07-31 | Discharge: 2019-07-31 | Payer: BC Managed Care – PPO

## 2019-07-31 NOTE — Telephone Encounter
Back up living donor organ offer  surgery 08/16/19  Pt needs updated MTN sample  LVM

## 2019-08-01 ENCOUNTER — Encounter: Admit: 2019-08-01 | Discharge: 2019-08-01 | Payer: BC Managed Care – PPO

## 2019-08-01 NOTE — Telephone Encounter
[id number]

## 2019-08-01 NOTE — Telephone Encounter
TFA was asked to verify patients 2021 insurance coverage due to her being a back up for a living donor txp and patient had left txp nurse coordinator Hillary a VM advising as of 08/02/19 she would have medicare and a supplement or secondary but didn't provide any ID information. TFA was able to verify Medicare A and B coverage effective as of 06/01/18 with Medicare# [id number].     TFA was also able to verify on CMS website patients Medicare part D RX plan effective 08/02/19 with [facility] Value Script,phone# [phone] but would not be able to get benefit details until after 08/02/19

## 2019-08-16 ENCOUNTER — Encounter: Admit: 2019-08-16 | Discharge: 2019-08-16 | Payer: BC Managed Care – PPO

## 2019-08-16 NOTE — Telephone Encounter
Patient notified she is off back up status. LD transplant was canceled. She voiced understanding.

## 2019-08-20 ENCOUNTER — Encounter: Admit: 2019-08-20 | Discharge: 2019-08-20 | Payer: BC Managed Care – PPO

## 2019-08-20 NOTE — Telephone Encounter
WAITLIST FINANCIAL UPDATE    Insurance has been verified though [employer].  As of 08/20/19 patient has ACTIVE   Primary BCBS [place] ID.#:[id number]. Ref.#:[id number].  Secondary Medicare ABD ID.#:[id number]. Ref.#: [id number].  *Financial Assessment Approved  *Listing Auth with BCBS [place] on file no exp. date    TRANSPLANT AUTHORIZATION     PLANBCBS [place]ORIZATION Phase 2 Listing  Auth #:[id number] EFF:11/27/2019NOEXP. Date given  TJL:LVDIXV TaylorPhone #:[phone] [phone]

## 2019-08-30 ENCOUNTER — Encounter: Admit: 2019-08-30 | Discharge: 2019-08-30 | Payer: BC Managed Care – PPO

## 2019-08-30 NOTE — Telephone Encounter
FINANCIAL ASSESSMENT    Reviewed Financial Assessment that was completed with patient on 04/30/19 with financial supervisor and patient's financial assessment remains approve. MM 08/30/19

## 2019-08-30 NOTE — Telephone Encounter
TRANSPLANT BENEFIT COLLECTION:   TRANSPLANT TYPE: Kidney    Verified by:  Barbaraann Faster      Date: January 29,2021  PRIMARY  ID #: [id number]   EFF: 05/01/18 Part A / 06/02/19 Part B   Subscriber:Self   Ins Plan:Medicare A&B    Phone#:[phone]   Plan Type:Traditional  MEDICARE A&B - 2021 BENEFIT SUMMARY:  PART A: DAYS REFRESH AFTER 60 CONSECUTIVE OUTPT DAYS  DAYS 1-60 $1484 DEDUCTIBLE  DAYS 61-90 $371/DAY COPAY  DAYS 91-150 (USING ANY LIFETIME RESERVE DAYS) $704DAY COPAY  DAYS > 150 PT PORTION 100%; after a 3-day minimum medically necessary inpatient hospital stay for a related illness or injury.  PART A/ SNF BENEFITS: IN 2021: DAYS 1-20 PT PORTION $0; DAYS 21-100 $185.50/DAY COPAY; DAYS > 100 PT PORTION 100%  PART B / BENEFITS IN 2021:  DEDUCTIBLE $203 WITH 80% REIMBURSEMENT OF ALLOWABLE, NO OUT OF POCKET MAXIMUM  MEDICARE WILL PAY FOR DRUGS INFUSED THROUGH AN ITEM OF DME, LIKE AN INFUSION PUMP, OR DRUGS GIVEN BY A NEBULIZER WHEN GIVEN BY A LICENSED MEDICAL PROVIDER.  PART B WILL COVER IMMUNOSUPPRESSIVE DRUGS IF MC COVERED THE TRANSPLANT, EVEN AS SECONDARY PAYER  No Case Management, No transplant Network, No prior authorizations, No benefit limits  [employer] Ref.#:[id number]    PART D  RX Plan:Well Care     Phone #: [phone]  Co-Pay Structure: Subject to Coverage Gap  Current Subsidy: No Extra Help  Valcyte: Not Covered  Generic: $$281.84 Tier 3  Ded:$445 Deductible applies to tiers 3 - 5  Spoke to [employer].#: [id number]  Coverage gap in 2021:  Deductible StageThis is the period when you pay for your prescription drugs in full (reduced by the plans low negotiated rates) until you meet your yearly deductible amount.   Stage 1 pt pays as plan requires until total paid out = $4,130  Stage 2 coverage gap, pt pays 25% of cost of generics, 25% of cost of brand names until total paid out = $6550(pt and Plan)  Patient responsibility in gap is approximately 50% of $2,420 = $1,210. Stage 3 catastrophic pt will pay as plan requires (small copays or coinsurances)    SECONDARY  ID #: [id number]  EFF:08/02/19  Subscriber: Self   Ins Plan: Cigna  Phone#:[phone]   Plan Type:G  MEDICARE SUPPLEMENT PLAN G ? PATIENT OWES PART B DEDUCTIBLE  COVERS PT A [facility] COSTS UP TO AN ADDITIONAL 365 DAYS AFTER MEDICARE BENEFITS ARE USED UP,  ? MEDICARE PART B COINSURANCE OR COPAYMENT,  FIRST 3 PINTS OF BLOOD  PART A HOSPICE CARE COINSURANCE OR COPAYMENT  SKILLED NURSING FACILITY COINSURANCE  ? PART A DEDUCTIBLE  PART B EXCESS CHGS,   FOREIGN TRAVEL EMERGENCY UP TO PLAN LIMITS  MEDICARE PREVENTIVE CARE PART B COINSURANCE  Spoke to Hallowell B at 1:46pm on 08/30/19    Auth requirements:NO Auth Needed for Eval & Listing

## 2019-08-30 NOTE — Telephone Encounter
called

## 2019-08-30 NOTE — Telephone Encounter
INSURANCE    Patient NO LONGER HAS BCBS [place] 08/01/19    Called patient and left her a message with my contact information and a brief message advising her that her insurance through her husband is no longer active. Advised her to call me with her new insurance information. MM 08/30/19    Patient has ONLY has active Medicare ABD ID.#:[id number]. Rx.WellCare [phone]  SECONDARY INSURANCE TO MEDICARE IS REQUIRED    Is Recommended to place patient oh HOLD till we have new insurance information.        Reference Number: [id number]

## 2019-09-09 ENCOUNTER — Encounter: Admit: 2019-09-09 | Discharge: 2019-09-09 | Payer: BC Managed Care – PPO

## 2019-09-09 NOTE — Telephone Encounter
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

## 2019-09-10 ENCOUNTER — Encounter: Admit: 2019-09-10 | Discharge: 2019-09-10 | Payer: BC Managed Care – PPO

## 2019-09-10 NOTE — Telephone Encounter
WAITLIST FINANCIAL UPDATE    Insurance has been verified though [employer].  As of 09/10/2019 patient has ACTIVE   Primary Medicare ABD. Ref.#:[id number].  Secondary Cigna Supplement.   *Financial Assessment Approved  *No Auth Needed for Listing  *No Financial Concerns / Barriers  MM 09/10/19

## 2019-09-11 ENCOUNTER — Encounter: Admit: 2019-09-11 | Discharge: 2019-09-11 | Payer: BC Managed Care – PPO

## 2019-09-11 NOTE — Telephone Encounter
Coordinator received staff message from waitlist coordinator, Thurston Pounds RN, regarding patient's mammogram. I called Amalya to follow up regarding last mammogram testing and location so we can obtain and add to her chart. I spoke to Margeret, she stated her mammogram was completed in March or April of 2020 with [facility] in [place], [place]. She reports that it was normal and no further testing was indicated. I stated that our team will request the report and add to her chart. I reminded her that she will need to update this test soon since it is completed annually and to let me know when she completes the update. Ardena able to verbalize understanding and had no further questions at this time.     Coordinator sent staff message to MA asking that she request mammogram report from [facility] as STAT since patient is active on the list.

## 2019-09-13 ENCOUNTER — Encounter: Admit: 2019-09-13 | Discharge: 2019-09-13 | Payer: BC Managed Care – PPO

## 2019-09-16 ENCOUNTER — Encounter: Admit: 2019-09-16 | Discharge: 2019-09-16 | Payer: BC Managed Care – PPO

## 2019-09-20 ENCOUNTER — Encounter: Admit: 2019-09-20 | Discharge: 2019-09-20 | Payer: BC Managed Care – PPO

## 2019-09-20 DIAGNOSIS — Z005 Encounter for examination of potential donor of organ and tissue: Secondary | ICD-10-CM

## 2019-09-20 NOTE — Telephone Encounter
Received organ offer from [facility] number [place] number [id number].  Reviewed donor summary and match run list.  Organ donor has KDPI of 45%, patient notified.  Organ offer is increased risk.  Reviewed recipients informed consent visit form.    Larya is back up status.  Reviewed recipient's history.  Notified Mardee of organ offer, organ type and current status on list.  Jalen verbalized agreement to proceed with organ offer.  Assessment negative for changes in medical status, social status, insurance changes, and use of anticoagulants.  Makendra notified to be on standby status and given coordinators contact information.         Discussed recent outbreak of corona virus or COVID 19.   Reviewed according to the CDC, those at increased risk for severe illness included immunocompromised, those with comorbid conditions such as cardiovascular disease and diabetes and those of advanced age.    Completed telephone screen for risk of infection exposure with patient:    ? Are you or have you been around anyone who is sick? (cough, muscle pain, vomiting, diarrhea, rash, weakness, abdominal pain, fever, red eye, bruising or bleeding, joint pain, severe headache) No  Explain:    ? Have you traveled internationally? No  Explain:    ? Have you traveled outside of [place] or [place] in the last 14 days? No  Explain:      Advised patient of limited visitor policy.   Currently only one visitor allowed at this time.   All visitors including accompanied care giver must meet above criteria (no illness, no recent travel exposure) and no children are allowed at this time.

## 2019-09-20 NOTE — Telephone Encounter
Called Shayona to let her know that the kidneys went to the individuals in front of her and she was off of back up status. She stated understanding.

## 2019-10-04 ENCOUNTER — Encounter: Admit: 2019-10-04 | Discharge: 2019-10-04 | Payer: BC Managed Care – PPO

## 2019-10-04 NOTE — Telephone Encounter
WAITLIST FINANCIAL UPDATE    Insurance has been verified though [employer].  As of 10/04/19 patient has ACTIVE   Primary Medicare ABD. Ref.#:[id number].  Secondary Cigna Supplement.   *Financial Assessment Approved  *No Auth needed for Listing  *No Financial concerns / Barriers

## 2019-10-16 ENCOUNTER — Encounter: Admit: 2019-10-16 | Discharge: 2019-10-16 | Payer: BC Managed Care – PPO

## 2019-10-23 ENCOUNTER — Encounter: Admit: 2019-10-23 | Discharge: 2019-10-23 | Payer: BC Managed Care – PPO

## 2019-10-28 ENCOUNTER — Encounter: Admit: 2019-10-28 | Discharge: 2019-10-28 | Payer: BC Managed Care – PPO

## 2019-10-28 NOTE — Telephone Encounter
Chart review for [facility]

## 2019-11-05 ENCOUNTER — Encounter: Admit: 2019-11-05 | Discharge: 2019-11-05 | Payer: BC Managed Care – PPO

## 2019-11-05 NOTE — Telephone Encounter
Coordinator notified by Jacqueline Shields, transplant financial advisor, that patient's secondary insurance has termed. Jacqueline Shields advised that patient doesn't have adequate insurance to undergo transplant and should be made inactive on the waitlist. Coordinator changed patient's status in UNET to inactive. I called and left detailed voicemail for Jacqueline Shields. I stated that I was notified by Jacqueline Shields, our financial advisor that her secondary insurance has termed. I stated that due to no active secondary coverage she doesn't have the adequate resources to undergo kidney transplant at this time. I stated that she needs to call Jacqueline Shields to follow up and discuss insurance requirements for transplant as well and discuss if she is aware of termination and if she has new/additional insurance on file. I gave Jacqueline Shields's number and stated she needs to call to her to follow up and if Jacqueline Shields doesn't answer she must leave a voicemail with information regarding her insurance information with group and id numbers so they can verify. I reviewed that she is now inactive on the waitlist until we have insurance verified and ok from our TFA. I stated that while inactive she will continue to accrue wait time however she won't be called for an organ offer to undergo kidney transplant.     -Transplant tab changed to inactive   -Status in UNET changed to inactive   -Letter regarding status change to inactive drafted and given to admin to mail

## 2019-11-05 NOTE — Telephone Encounter
WAITLIST FINANCIAL UPDATE    Insurance has been verified though [employer].  As of 11/05/19 patient has ACTIVE   Primary Medicare ABD. Ref.#:[id number].  Secondary Cigna Supplement Stacie Glaze Supplement at [phone] and spoke to [place] and he stated that coverage TERMINATED 08/02/19. advised Juanda Crumble that I spoke to [place] on 08/30/19 and stated that policy was active. Charles confirmed with his management and it looks like patient has a different policy with them but is pending status patient needs to call and advised them if she wants to continue with them and activate policy per Juanda Crumble as of right now PATIENT DOES NOT [place].  Ref.#Mercie Eon 10:13am 11/05/19.     NOTE: Called patient and left her a message with my contact information and a brief messages advising her that her Christella Scheuermann shows term and that I called C[employer] and they do show a pending policy but she needs to call them and let them know if she wants to activate this policy with them. Advised her to let me know if she has a different secondary coverage to Medicare.     Also called her husband's phone and left him a message with the same information.     PENDING RESPONSE FROM PATIENT    NO SECONDARY COVERAGE TFA CANNOT GIVE CLEARANCE FOR TRANSPLANT

## 2019-11-11 ENCOUNTER — Encounter: Admit: 2019-11-11 | Discharge: 2019-11-11 | Payer: BC Managed Care – PPO

## 2019-11-11 NOTE — Telephone Encounter
in the office last week, told her the Cigna supplemental was termed due to non-payment, she contacted   ???, now fixed.     Broker....    never sent her a Medical laboratory scientific officer, Margarita Grizzle

## 2019-11-12 ENCOUNTER — Encounter: Admit: 2019-11-12 | Discharge: 2019-11-12 | Payer: BC Managed Care – PPO

## 2019-11-12 NOTE — Telephone Encounter
Coordinator received notification form Jacqueline Shields TFA that patient has been cleared financially. Patient's status updated to ACTIVE in [place]. I called Jacqueline Shields and left a message stating her status is officially back to ACTIVE now that her insurance has been verified and reviewed by TFA. I stated that she could be called anytime for an organ offer and that she MUST keep her phone on and with her at all times. I stated that she can set up her voicemail to transcribe into a written message in case she is at work or in a quiet environment. I stated that for an organ offer she only has 60 minutes to return the call or they will move onto the next person in line. I stated that she has worked very hard and we don't want her to miss a call for an offer. I stated that she needs to tell Cyndie Chime, her spouse to keep his phone on and with him as well. I stated she can call me with any questions.     -Transplant tab changed to waitlist ACTIVE   -Status in UNET changed to [place] stating patient status is now ACTIVE drafted and given to MA to mail [employer]

## 2019-11-12 NOTE — Telephone Encounter
TRANSPLANT BENEFIT COLLECTION:   TRANSPLANT TYPE: Kidney    Verified by:  Barbaraann Faster      Date: November 12, 2019  PRIMARY  ID #: [id number]   EFF:05/01/18 Part A / 06/02/19 Part B  Subscriber:Self   Ins Plan:Medicare A&B   Phone#:[phone]   Plan Type:Traditional  MEDICARE A&B - 2021 BENEFIT SUMMARY:  PART A: DAYS REFRESH AFTER 60 CONSECUTIVE OUTPT DAYS  DAYS 1-60 $1484 DEDUCTIBLE  DAYS 61-90 $371/DAY COPAY  DAYS 91-150 (USING ANY LIFETIME RESERVE DAYS) $704DAY COPAY  DAYS > 150 PT PORTION 100%; after a 3-day minimum medically necessary inpatient hospital stay for a related illness or injury.  PART A/ SNF BENEFITS: IN 2021: DAYS 1-20 PT PORTION $0; DAYS 21-100 $185.50/DAY COPAY; DAYS > 100 PT PORTION 100%  PART B / BENEFITS IN 2021:  DEDUCTIBLE $203 WITH 80% REIMBURSEMENT OF ALLOWABLE, NO OUT OF POCKET MAXIMUM  MEDICARE WILL PAY FOR DRUGS INFUSED THROUGH AN ITEM OF DME, LIKE AN INFUSION PUMP, OR DRUGS GIVEN BY A NEBULIZER WHEN GIVEN BY A LICENSED MEDICAL PROVIDER.  PART B WILL COVER IMMUNOSUPPRESSIVE DRUGS IF MC COVERED THE TRANSPLANT, EVEN AS SECONDARY PAYER  No Case Management, No transplant Network, No prior authorizations, No benefit limits  One[employer] Ref.#:[id number]    PART D  RX Plan:WellCare     Phone #: [phone]  Co-Pay Structure: Subject to Coverage Gap  Current Subsidy: NO Extra Help  Valcyte: Not Covered  Generic: $281.84  Spoke to [facility] 08/30/19 Ref.#: [id number]  Coverage gap in 2021:  Deductible StageThis is the period when you pay for your prescription drugs in full (reduced by the plans low negotiated rates) until you meet your yearly deductible amount.   Stage 1 pt pays as plan requires until total paid out = $4,130  Stage 2 coverage gap, pt pays 25% of cost of generics, 25% of cost of brand names until total paid out = $6550(pt and Plan)  Patient responsibility in gap is approximately 50% of $2,420 = $1,210.  Stage 3 catastrophic pt will pay as plan requires (small copays or coinsurances)    SECONDARY  ID #: [id number]       Subscriber: Self   Ins Plan: Cigna Supplement    Plan Type:G  MEDICARE SUPPLEMENT PLAN G ? PATIENT OWES PART B DEDUCTIBLE  COVERS PT A [facility] COSTS UP TO AN ADDITIONAL 365 DAYS AFTER MEDICARE BENEFITS ARE USED UP,  ? MEDICARE PART B COINSURANCE OR COPAYMENT,  FIRST 3 PINTS OF BLOOD  PART A HOSPICE CARE COINSURANCE OR COPAYMENT  SKILLED NURSING FACILITY COINSURANCE  ? PART A DEDUCTIBLE  PART B EXCESS CHGS,   FOREIGN TRAVEL EMERGENCY UP TO PLAN LIMITS  MEDICARE PREVENTIVE CARE PART B COINSURANCE  One[employer] Ref,#:[id number]    Auth requirements:No Auth Needed for Eval & Listing

## 2019-11-19 NOTE — Telephone Encounter
WAITLIST FINANCIAL UPDATE    Insurance has been verified though One[employer].  As of 11/19/19 patient has ACTIVE   Primary Medicare ABD. Ref.#:[id number].  Secondary Cigna Supplement G. Ref.#: [id number].  *Financial Assessment Approved  *No Auth Needed for Listing  *No Financial Concerns / Barriers

## 2019-12-04 ENCOUNTER — Encounter: Admit: 2019-12-04 | Discharge: 2019-12-04 | Payer: BC Managed Care – PPO

## 2019-12-11 ENCOUNTER — Encounter: Admit: 2019-12-11 | Discharge: 2019-12-11 | Payer: BC Managed Care – PPO

## 2019-12-11 NOTE — Telephone Encounter
WAITLIST FINANCIAL UPDATE    Insurance has been verified though [employer].  As of 12/11/19 patient has ACTIVE   Primary Medicare ABD. Ref.#:[id number].  Secondary Cigna Supplement G. Ref.#: [id number].  *Financial Assessment Approved  *No Auth Needed for Listing  *No Financial Concerns / Barrier

## 2019-12-17 ENCOUNTER — Encounter: Admit: 2019-12-17 | Discharge: 2019-12-17 | Payer: BC Managed Care – PPO

## 2019-12-17 NOTE — Telephone Encounter
WAITLIST FINANCIAL UPDATE    Insurance has been verified though [employer].  As of 12/17/19 patient has ACTIVE   Primary Medicare ABD. Ref.[id number].  Secondary Cigna Supplement. Ref.#: [id number].  *Financial Assessment Approved  *No Auth Needed for Listing  *No Financial Concerns / Barriers

## 2019-12-27 ENCOUNTER — Encounter: Admit: 2019-12-27 | Discharge: 2019-12-27 | Payer: BC Managed Care – PPO

## 2019-12-27 NOTE — Telephone Encounter
WAITLIST FINANCIAL UPDATE  ?  Insurance has been verified though [employer].  As of 12/27/2019 patient has ACTIVE   Primary Medicare ABD.  Secondary Cigna Supplement.  *Financial Assessment Approved  *No Auth Needed for Listing  *No Financial Concerns / Barriers

## 2020-01-10 ENCOUNTER — Encounter: Admit: 2020-01-10 | Discharge: 2020-01-10 | Payer: BC Managed Care – PPO

## 2020-01-10 NOTE — Telephone Encounter
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 Curt Bears has discussed with nephrologist

## 2020-01-15 ENCOUNTER — Encounter: Admit: 2020-01-15 | Discharge: 2020-01-15 | Payer: BC Managed Care – PPO

## 2020-01-15 NOTE — Telephone Encounter
WAITLIST FINANCIAL UPDATE    Insurance has been verified though [employer].  As of 01/15/20 patient has ACTIVE   Primary Medicare A&B. Ref.#:[id number].  Secondary Cigna Supplement G. Ref.#: [id number].  *Financial Assessment Approved  *No Auth Needed for Listing  *No Financial Concerns / Barriers

## 2020-01-24 ENCOUNTER — Encounter: Admit: 2020-01-24 | Discharge: 2020-01-24 | Payer: BC Managed Care – PPO

## 2020-01-24 NOTE — Telephone Encounter
WAITLIST FINANCIAL UPDATE    Insurance has been verified though [employer].  As of 01/24/20 patient has ACTIVE   Primary Medicare ABD. Ref.#:[id number].  Secondary Cigna Supplement G. Ref.#: [id number].  *Financial Assessment Approved  *No Auth Needed for Listing  *No Financial Concerns / [place]

## 2020-01-29 ENCOUNTER — Encounter: Admit: 2020-01-29 | Discharge: 2020-01-29 | Payer: BC Managed Care – PPO

## 2020-01-30 ENCOUNTER — Encounter: Admit: 2020-01-30 | Discharge: 2020-01-30 | Payer: BC Managed Care – PPO

## 2020-02-11 ENCOUNTER — Encounter: Admit: 2020-02-11 | Discharge: 2020-02-11 | Payer: BC Managed Care – PPO

## 2020-02-11 NOTE — Telephone Encounter
WAITLIST FINANCIAL UPDATE    Insurance has been verified though [employer].  As of 02/11/20 patient has ACTIVE   Primary Medicare A&B. Ref.#:[id number].  Secondary Cigna Supplement G. Ref.#: [id number].  *Financial Assessment Approved  *No Auth Needed for Listing  *No Financial Concerns / Barriers

## 2020-02-27 ENCOUNTER — Encounter: Admit: 2020-02-27 | Discharge: 2020-02-27 | Payer: BC Managed Care – PPO

## 2020-02-27 NOTE — Telephone Encounter
INSURANCE COVERAGE / FINANCIAL INFORMATION    WAITLIST FINANCIAL UPDATE    Insurance has been verified though [employer].  As of 02/27/20 patient has ACTIVE   Primary: Medicare A&B. Ref.#:[id number]   Secondary: Cigna Supplement G. Ref.#: [id number]  *Financial Assessment Approved  *No Auth Needed for Listing  *No Financial Concerns / Barriers

## 2020-03-17 ENCOUNTER — Encounter: Admit: 2020-03-17 | Discharge: 2020-03-17 | Payer: BC Managed Care – PPO

## 2020-03-23 ENCOUNTER — Encounter: Admit: 2020-03-23 | Discharge: 2020-03-23 | Payer: BC Managed Care – PPO

## 2020-03-23 DIAGNOSIS — Z005 Encounter for examination of potential donor of organ and tissue: Secondary | ICD-10-CM

## 2020-03-24 ENCOUNTER — Encounter: Admit: 2020-03-24 | Discharge: 2020-03-24 | Payer: BC Managed Care – PPO

## 2020-04-03 ENCOUNTER — Encounter: Admit: 2020-04-03 | Discharge: 2020-04-03 | Payer: BC Managed Care – PPO

## 2020-04-03 NOTE — Telephone Encounter
WAITLIST FINANCIAL UPDATE    Insurance has been verified though [employer].  As of 04/03/20 patient has ACTIVE   Primary Medicare ABD. Ref.#:[id number].  Secondary Cigna Supplement G. Ref.#: [id number].  *Financial Assessment Approved  *No Auth Needed for Listing  *No Financial Concerns / Barriers

## 2020-04-07 ENCOUNTER — Encounter: Admit: 2020-04-07 | Discharge: 2020-04-07 | Payer: BC Managed Care – PPO

## 2020-04-07 NOTE — Telephone Encounter
Coordinator called patient and her husband and left voicemail on each of their lines. I stated that Jacqueline Shields is due for her annual update with the transplant center and that we need to get her scheduled. I stated that we have an opening for 05/05/2020 at 12pm and asked that she return my call to notify me if that date/time works for her and support person. I stated that I need her to call me so I can review some questions regarding her current health and insurance. I listed my number and asked for a return call, I stated that if she reaches my voicemail to leave a message regarding date and time so I can proceed with scheduling.

## 2020-04-08 ENCOUNTER — Encounter: Admit: 2020-04-08 | Discharge: 2020-04-08 | Payer: BC Managed Care – PPO

## 2020-04-08 DIAGNOSIS — N186 End stage renal disease: Secondary | ICD-10-CM

## 2020-04-08 DIAGNOSIS — Z01818 Encounter for other preprocedural examination: Secondary | ICD-10-CM

## 2020-04-08 NOTE — Telephone Encounter
Coordinator left vm for patient yesterday regarding need for annual appointment, offered 05/05/2020. Patient returned called and left a detailed voicemail. She stated that Tuesday, October 5th at 12pm works for her and her support person. Skylan stated she has had no changes to insurance and denied hospitalizations/changes in health status. I completed chart review and returned call to patient. I left a voicemail stating she is scheduled for 05/05/2020 at 12pm. I stated that she needs to arrive about 15 minutes early to the appointment. I reminded patient to bring her support person (over the age of 70 and can drive), updated medication list and insurance cards. I stated she will need to watch the education video and that I have emailed the link to her e-mail address. I stated that appointment is 3-4 hours and encouraged her to bring a snack with her. I stated that her mammogram was due in July 2021 and that I requested the most recent report from [facility] however if she has yet to update she needs to set up an appointment now. I asked if she is aware of her last hemoglobin a1c level as it has varied in the last few years and sometimes becomes elevated. I asked that she return my call or e-mail with information regarding mammogram and a1c level.     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

## 2020-04-13 ENCOUNTER — Encounter: Admit: 2020-04-13 | Discharge: 2020-04-13 | Payer: BC Managed Care – PPO

## 2020-04-15 ENCOUNTER — Encounter: Admit: 2020-04-15 | Discharge: 2020-04-15 | Payer: BC Managed Care – PPO

## 2020-04-15 NOTE — Telephone Encounter
FINANCIAL SELECTION MEETING PREPARATION    Insurance has been verified though [employer].  As of 04/15/20 patient has ACTIVE   Primary Medicare ABD . Ref.#:[id number].  Secondary Cigna Supplement. Ref.#: [id number].  *Financial Assessment Approved  *No Auth Needed for Listing  *No Financial Concerns / Barriers

## 2020-05-05 ENCOUNTER — Encounter: Admit: 2020-05-05 | Discharge: 2020-05-05

## 2020-05-05 ENCOUNTER — Ambulatory Visit: Admit: 2020-05-05 | Discharge: 2020-05-05 | Payer: MEDICARE

## 2020-05-05 ENCOUNTER — Ambulatory Visit: Admit: 2020-05-05 | Discharge: 2020-05-05 | Payer: Commercial Managed Care - PPO

## 2020-05-05 ENCOUNTER — Ambulatory Visit: Admit: 2020-05-05 | Discharge: 2020-05-06 | Payer: MEDICARE

## 2020-05-05 DIAGNOSIS — Z01818 Encounter for other preprocedural examination: Secondary | ICD-10-CM

## 2020-05-05 DIAGNOSIS — R809 Proteinuria, unspecified: Secondary | ICD-10-CM

## 2020-05-05 DIAGNOSIS — D472 Monoclonal gammopathy: Secondary | ICD-10-CM

## 2020-05-05 DIAGNOSIS — E1100 Type 2 diabetes mellitus with hyperosmolarity without nonketotic hyperglycemic-hyperosmolar coma (NKHHC): Secondary | ICD-10-CM

## 2020-05-05 DIAGNOSIS — N19 Unspecified kidney failure: Secondary | ICD-10-CM

## 2020-05-05 DIAGNOSIS — I150 Renovascular hypertension: Secondary | ICD-10-CM

## 2020-05-05 DIAGNOSIS — N186 End stage renal disease: Secondary | ICD-10-CM

## 2020-05-05 DIAGNOSIS — E119 Type 2 diabetes mellitus without complications: Secondary | ICD-10-CM

## 2020-05-05 DIAGNOSIS — I10 Essential (primary) hypertension: Secondary | ICD-10-CM

## 2020-05-05 DIAGNOSIS — E669 Obesity, unspecified: Secondary | ICD-10-CM

## 2020-05-05 DIAGNOSIS — R897 Abnormal histological findings in specimens from other organs, systems and tissues: Secondary | ICD-10-CM

## 2020-05-05 DIAGNOSIS — D649 Anemia, unspecified: Secondary | ICD-10-CM

## 2020-05-05 LAB — CBC
Lab: 13 g/dL (ref 12.0–15.0)
Lab: 14 % (ref 11–15)
Lab: 208 K/UL (ref 150–400)
Lab: 28 pg (ref 26–34)
Lab: 33 g/dL (ref 32.0–36.0)
Lab: 4.7 M/UL (ref 4.0–5.0)
Lab: 40 % (ref 36–45)
Lab: 84 FL (ref 80–100)
Lab: 9.3 FL (ref 7–11)
Lab: 9.7 10*3/uL (ref 4.5–11.0)

## 2020-05-05 LAB — HEPATITIS B SURFACE AG

## 2020-05-05 LAB — HEPATITIS B SURFACE AB: Lab: POSITIVE — AB

## 2020-05-05 LAB — PHOSPHORUS: Lab: 3.6 mg/dL (ref 2.0–4.5)

## 2020-05-05 LAB — TOXOPLASMA IGG: Lab: NEGATIVE

## 2020-05-05 LAB — HIV 1& 2 AG-AB SCRN W REFLEX HIV 1 PCR QUANT

## 2020-05-05 LAB — LIVER FUNCTION PANEL
Lab: 0.1 mg/dL (ref <0.4)
Lab: 0.5 mg/dL (ref 0.3–1.2)
Lab: 24 U/L (ref 7–56)
Lab: 25 U/L (ref 7–40)
Lab: 4.1 g/dL (ref 3.5–5.0)
Lab: 78 U/L (ref 25–110)

## 2020-05-05 LAB — SYPHILIS AB SCREEN: Lab: NEGATIVE

## 2020-05-05 LAB — PROTIME INR (PT): Lab: 1.1 (ref 0.8–1.2)

## 2020-05-05 LAB — HEPATITIS B CORE AB TOT (IGG+IGM)

## 2020-05-05 LAB — GGTP: Lab: 21 U/L (ref 9–64)

## 2020-05-05 LAB — HEPATITIS C ANTIBODY W REFLEX HCV PCR QUANT

## 2020-05-05 NOTE — Progress Notes
Date of Service: 05/05/2020     Subjective:             Jacqueline Shields is a 54 y.o. female.    History of Present Illness    Hx of type 2 DM, HTN, on PD. She is here for annual update     Review of Systems   Constitutional: Positive for fatigue.   HENT: Negative.    Eyes: Negative.    Respiratory: Negative.    Cardiovascular: Negative.    Gastrointestinal: Negative.    Endocrine: Negative.    Genitourinary: Negative.    Musculoskeletal: Negative.    Neurological: Negative.    Psychiatric/Behavioral: Negative.        Medical History:   Diagnosis Date   ? Abnormal biopsy of kidney    ? Anemia    ? Diabetes mellitus (HCC)    ? ESRD (end stage renal disease) (HCC)    ? Hypertension    ? Kidney failure    ? MGUS (monoclonal gammopathy of unknown significance)    ? Proteinuria      Surgical History:   Procedure Laterality Date   ? CATHETER IMPLANT/REVISION      PD cath   ? HX HYSTERECTOMY     ? HX LITHOTRIPSY       Family History   Problem Relation Age of Onset   ? Diabetes Mother    ? Hypertension Mother    ? Diabetes Father    ? Hypertension Father    ? Cancer-Hematologic Father    ? Diabetes Sister    ? Migraines Sister    ? Cancer Sister         Endometrial     Social History     Socioeconomic History   ? Marital status: Married     Spouse name: Not on file   ? Number of children: Not on file   ? Years of education: Not on file   ? Highest education level: Not on file   Occupational History   ? Not on file   Tobacco Use   ? Smoking status: Never Smoker   ? Smokeless tobacco: Never Used   Substance and Sexual Activity   ? Alcohol use: Not Currently   ? Drug use: Never   ? Sexual activity: Not on file   Other Topics Concern   ? Not on file   Social History Narrative   ? Not on file           Objective:   Allergies as of 05/05/2020   ? (No Known Allergies)           ? aspirin EC 81 mg tablet Take 81 mg by mouth daily. Take with food.   ? calcium acetate (PHOSLO) 667 mg capsule Take 667 mg by mouth three times daily. ? fenofibrate micronized (LOFIBRA) 134 mg capsule Take 134 mg by mouth daily.   ? gentamicin 0.1 % topical cream Apply  topically to affected area daily as needed.   ? insulin glargine (LANTUS) 100 unit/mL injection Inject 90 Units under the skin daily.   ? metoprolol XL (TOPROL XL) 100 mg extended release tablet Take 100 mg by mouth daily.   ? nateglinide (STARLIX) 60 mg tablet Take 60 mg by mouth three times daily before meals.   ? NIFEdipine SR (PROCARDIA-XL; ADALAT CC) 60 mg tablet Take 60 mg by mouth daily.   ? TRULICITY 1.5 mg/0.5 mL injection pen Inject 1.5 mg under the skin  every 7 days. Saturday     There were no vitals filed for this visit.  There is no height or weight on file to calculate BMI.     Physical Exam  Vitals reviewed.   Constitutional:       Appearance: Normal appearance.   HENT:      Head: Normocephalic and atraumatic.      Nose: Nose normal.   Eyes:      Extraocular Movements: Extraocular movements intact.      Pupils: Pupils are equal, round, and reactive to light.   Cardiovascular:      Rate and Rhythm: Normal rate and regular rhythm.   Pulmonary:      Breath sounds: Rales present.   Abdominal:      General: Bowel sounds are normal. There is distension.      Palpations: Abdomen is soft. There is no mass.   Musculoskeletal:         General: Normal range of motion.      Cervical back: Normal range of motion and neck supple.   Skin:     General: Skin is warm and dry.   Neurological:      General: No focal deficit present.      Mental Status: She is alert and oriented to person, place, and time.   Psychiatric:         Mood and Affect: Mood normal.         Behavior: Behavior normal.         Hospital Outpatient Visit on 06/04/2019   Component Date Value Ref Range Status   ? BSA 06/04/2019 2.25  m2 Final   ? LVIDD 06/04/2019 4.0  3.8 - 5.2 cm Final   ? IVS 06/04/2019 1.3  0.6 - 0.9 cm Final   ? PW 06/04/2019 1.1  0.6 - 0.9 cm Final   ? LVIDS 06/04/2019 2.5  2.2 - 3.5 cm Final   ? FS 06/04/2019 37.50  28 - 44 % Final   ? EF 06/04/2019 63.75  % Final   ? LA volume 06/04/2019 36  22 - 52 mL Final   ? Sinus 06/04/2019 3.2  2.4 - 3.6 cm Final   ? Ascending aorta 06/04/2019 3.1  cm Final   ? LV mass 06/04/2019 165  67 - 162 g Final   ? LA size 06/04/2019 2.9  2.7 - 3.8 cm Final   ? RWT 06/04/2019 0.55  <=0.42 Final   ? , with a mean gradient of 06/04/2019 5  mmHg Final   ? AV peak velocity 06/04/2019 1.5  m/s Final   ? Aortic valve area = 06/04/2019 48.38  cm2 Final   ? AV index (native) 06/04/2019 0.67   Final   ? E/A ratio 06/04/2019 0.98   Final   ? TDI lateral e' 06/04/2019 0.092  m/s Final   ? LVOT diameter 06/04/2019 2.0  cm Final   ? LVOT area 06/04/2019 3.14  cm2 Final   ? LVOT peak vel 06/04/2019 1.0  m/s Final   ? LVOT peak VTI 06/04/2019 23.1  cm Final   ? Ao VTI 06/04/2019 1.5  cm Final   ? LVOT stroke volume 06/04/2019 72.57  cm3 Final   ? and a peak gradient of 06/04/2019 9  mmHg Final   ? Lateral E/E' ratio 06/04/2019 6.52   Final   ? Right Ventricular Basal Diameter 06/04/2019 4.3  2.5 - 4.1 cm Final   ? Right Ventricular Mid Diameter  06/04/2019 3.4  1.9 - 3.5 cm Final   ? Right Atrial Area 06/04/2019 14.1  <18 cm2 Final   ? Right Heart Systolic TDI S' 06/04/2019 11.2  m/s Final   ? Right Heart Systolic Mmode TAPSE 06/04/2019 1.9  >1.7 cm Final   ? MV Peak E Vel PW 06/04/2019 0.600  m/s Final   ? MV Peak A Vel 06/04/2019 0.610  m/s Final   ? Left Atrium Index 06/04/2019 16.00  16 - 34 Final   ? Left Ventricle Mass Index 06/04/2019 74  43 - 95 g/m2 Final   ? TDI Medial e' 06/04/2019 0.070  m/s Final   ? Medial E/E' ratio 06/04/2019 8.57   Final   ? RA PRESSURE 06/04/2019 3   Final   ? Left Ventricle Diastolic Volume 06/04/2019 104  46 - 106 mL Final   ? Left Ventricle Diastolic Volume In* 06/04/2019 46  29 - 61 mL Final   ? ECHO EF 06/04/2019 60  % Final   ? SIMPSON'S BIPLANE EF 06/04/2019 63  % Final   ? Baseline HR 06/04/2019 72  bpm Final   ? Baseline BP - Sys 06/04/2019 132  mmHg Final   ? Peak HR 06/04/2019 81  bpm Final   ? Peak BP - Sys 06/04/2019 136  mmHg Final   ? Referring Provider 06/04/2019 Dorna Mai, MD   Final   ? PUL TO MYO COUNT RATIO 06/04/2019 0.35   Final   ? Stress Dose 06/04/2019 29.1  mCi Final   ? Rest Dose 06/04/2019 10.4  mCi Final   ? CV NUCLEAR BMI 06/04/2019 35.28  kg/m2 Final   ? MPI EF 06/04/2019 73  % Final   ? TID Ratio 06/04/2019 1.13   Final   ? Summed Stress Score 06/04/2019 0   Final   ? Summed Rest Score 06/04/2019 0   Final   ? Baseline BP - Dias 06/04/2019 76  mmHg Final   ? Peak BP - Dias 06/04/2019 73   Final   ? LV volume 06/04/2019 56  mL Final   ? Study Number 06/04/2019 [id number]   Final   ? Nuclear Cardiology Mortality Risk 06/04/2019 In aggregate the current study is low risk in regards to predicted annual cardiovascular mortality rate.   Final            Assessment and Plan:    54 y.o. female with past medical history significant for Hx of type 2 DM, HTN, on PD. She is here for annual update    Plan: The patient was seen and examined.She remains a good surgical candidate for kidney transplant pending full evaluation.  I went ahead in discussed the surgical aspects of kidney transplant, life commitment as well as deceased versus living donor transplants, immunosuppression, risks and benefits were discussed in detail.  If the patient clears from  cardio/pulmonary and psycho/social standpoint,she does not have surgical contraindication for the kidney transplant, the patient will be discussed in our selection committee.                        York Cerise, MD

## 2020-05-05 NOTE — Progress Notes
Patient seen by:   Dr. Wayne Sever: needs evaluation by PCP for depression and medication consideration, mammogram completed 2 weeks ago; request for report sent. Patient needs updated echocardiogram and stress test.   Loura Pardon LMSW: working full time still, spouse in the room is still her primary support, continues to do PD, admits to not checking blood glucose as often as she should, discussed anxiety and depression; endorses depression and discussed seeing a counselor, talked about potentially starting a medication with PCP, gave information about mentor program, Marge Duncans has [employer].   Dr. Armanda Magic: patient remains an acceptable surgical candidate, she tolerates PD well, no updated CT scan needed   Mayra Magana TFA: insurance has been verified recently, met with patient, no changes to insurance, no FAP update required, no financial concerns    You were seen by members of the Pre Renal/Pancreas Transplant Team today who has recommended that you complete the following:    ? Chest x-ray (today)   ? Lab work (today)  ? Echo  ? Stress Test   ? Follow up with PCP for depression/anxiety evaluation    Please keep your Nurse Coordinator informed of ANY changes in your health and insurance status.    We recommend that you sign up for MyChart. The activation code has been provided to you in your after visit summary for today's visit.     It was a pleasure to see you today. Thank you for partnering with [facility] for your care.     If you should have any questions or concerns, please feel free to contact us.     Carolee Rota RN, BSN  Pre Renal/Pancreas Nurse Coordinator  [facility]   [phone] [email or web address]

## 2020-05-05 NOTE — Telephone Encounter
FINANCIAL TRANSPLANT EVALUATION      Jacqueline Shields met with Jacqueline Shields for insurance consult. There appears to be no coverage/insurance barriers to transplant with Medicare ABD and [employer] Supplement plan. Jacqueline Shields that it is recommended to apply for Medicare Part D Extra Help program. Explained to Jacqueline Shields. Jacqueline Shields expressed No insurance concerns related to cost of post-transplant care and medications and verbalized understanding of Transplant Finance Coordinator's discussion and documents presented. Advised patient to call me if any insurance changes. Provided patient with my contact information. Patient verbalized understanding. No Authorization required prior to listing.    Documents presented:  Signed Patient Liability Form, Medication Cost Estimate.  PATIENT HAS BEEN ADVISED THAT AS A TRANSPLANT PATIENT SHE WILL NOT QUALIFY FOR FINANCIAL ASSISTANCE. Patient verbalized understanding.    *Financially Cleared  *NO Financial Concerns Jacqueline Shields      MM 05/05/20

## 2020-05-06 DIAGNOSIS — Z01818 Encounter for other preprocedural examination: Principal | ICD-10-CM

## 2020-05-08 ENCOUNTER — Encounter: Admit: 2020-05-08 | Discharge: 2020-05-08

## 2020-05-12 ENCOUNTER — Encounter: Admit: 2020-05-12 | Discharge: 2020-05-12

## 2020-05-13 ENCOUNTER — Encounter: Admit: 2020-05-13 | Discharge: 2020-05-13

## 2020-05-13 DIAGNOSIS — D472 Monoclonal gammopathy: Secondary | ICD-10-CM

## 2020-05-19 ENCOUNTER — Encounter: Admit: 2020-05-19 | Discharge: 2020-05-19

## 2020-05-19 ENCOUNTER — Ambulatory Visit: Admit: 2020-05-19 | Discharge: 2020-05-19

## 2020-05-19 DIAGNOSIS — Z01818 Encounter for other preprocedural examination: Secondary | ICD-10-CM

## 2020-05-21 ENCOUNTER — Encounter: Admit: 2020-05-21 | Discharge: 2020-05-21

## 2020-05-22 ENCOUNTER — Encounter: Admit: 2020-05-22 | Discharge: 2020-05-22

## 2020-05-22 DIAGNOSIS — N186 End stage renal disease: Secondary | ICD-10-CM

## 2020-05-22 DIAGNOSIS — Z01818 Encounter for other preprocedural examination: Secondary | ICD-10-CM

## 2020-05-22 NOTE — Telephone Encounter
Coordinator received return voicemail from patient regarding waitlist status change to inactive and need for cardiology consult. Jacqueline Shields's voicemail stated she is willing to see a cardiologist at [facility] and asked me to place order. I returned the call and left Jacqueline Shields a voicemail that cardiology consult has been placed and she will be called in the next few days to get scheduled. I stated after initial consult they will work to set up cardiac catheterization. I stated she may call me with any questions.     -Referral for adult cardiology placed  -Staff message sent to [facility] Team CVM and P MAC scheduling with request for patient to be scheduled

## 2020-05-22 NOTE — Telephone Encounter
-----   Message from [place], [place] sent at 05/22/2020  1:19 PM CDT -----  Regarding: Cardiology consult for [facility], patient listed for kidney transplant  Patient listed for kidney transplant and at the top of the waitlist, her routine stress test was abnormal, she needs a cardiology consult and LHC to rule out blockage or other cardiac concern. Please call patient to schedule appointment. Thank you.

## 2020-05-22 NOTE — Telephone Encounter
LM with patient asking for a call back to discuss getting an appointment scheduled.

## 2020-05-25 ENCOUNTER — Encounter: Admit: 2020-05-25 | Discharge: 2020-05-25 | Payer: MEDICARE

## 2020-05-25 ENCOUNTER — Encounter: Admit: 2020-05-25 | Discharge: 2020-05-25

## 2020-05-25 DIAGNOSIS — D472 Monoclonal gammopathy: Secondary | ICD-10-CM

## 2020-05-25 LAB — COMPREHENSIVE METABOLIC PANEL
Lab: 133 MMOL/L — ABNORMAL LOW (ref 137–147)
Lab: 19 mL/min — ABNORMAL LOW (ref >60)
Lab: 23 U/L (ref 7–40)
Lab: 23 mL/min — ABNORMAL LOW (ref >60)
Lab: 28 MMOL/L (ref 21–30)
Lab: 3.7 MMOL/L (ref 3.5–5.1)
Lab: 3.7 g/dL — ABNORMAL LOW (ref 3.5–5.0)
Lab: 31 U/L (ref 7–56)
Lab: 575 mg/dL — ABNORMAL HIGH (ref 70–100)
Lab: 6.8 g/dL (ref 6.0–8.0)
Lab: 8 K/UL (ref 3–12)
Lab: 97 MMOL/L — ABNORMAL LOW (ref 98–110)

## 2020-05-25 LAB — CBC AND DIFF
Lab: 0.1 10*3/uL (ref 0–0.20)
Lab: 4.2 M/UL (ref 4.0–5.0)
Lab: 8 10*3/uL (ref 4.5–11.0)

## 2020-05-25 LAB — MAGNESIUM: Lab: 1.6 mg/dL (ref 1.6–2.6)

## 2020-05-25 LAB — IMMUNOGLOBULINS-IGA,IGG,IGM
Lab: 119 mg/dL — ABNORMAL HIGH (ref 70–390)
Lab: 128 mg/dL — ABNORMAL HIGH (ref 762–1488)
Lab: 68 mg/dL — ABNORMAL LOW (ref 38–328)

## 2020-05-25 LAB — PHOSPHORUS: Lab: 3.4 mg/dL (ref 2.0–4.5)

## 2020-05-25 NOTE — Telephone Encounter
Patients glucose 575 with labs today. Per Seward Grater, APRN patient needs to go to the ER. Per patient she had a sprite before her labs today. Let patient know she still needs to go to the ER. Per patient she can take some insulin when she gets home. When asked about what insulin she is taking, per patient she has [place] at home. Let patient know that is a long acting insulin and would still advised patient go to the ER. Per patient, it will be about an hour before she can get to the ER. Let patient know that she needs to get the ER ASAP without delay. Patient v/u. Will continue to follow.

## 2020-05-25 NOTE — Progress Notes
Per Seward Grater, APRN pt needs to go to ER. Please see telephone encounter note. Will continue to follow.

## 2020-06-01 ENCOUNTER — Encounter: Admit: 2020-06-01 | Discharge: 2020-06-01

## 2020-06-01 ENCOUNTER — Encounter: Admit: 2020-06-01 | Discharge: 2020-06-01 | Payer: MEDICARE

## 2020-06-01 DIAGNOSIS — E1100 Type 2 diabetes mellitus with hyperosmolarity without nonketotic hyperglycemic-hyperosmolar coma (NKHHC): Secondary | ICD-10-CM

## 2020-06-01 DIAGNOSIS — E119 Type 2 diabetes mellitus without complications: Secondary | ICD-10-CM

## 2020-06-01 DIAGNOSIS — R897 Abnormal histological findings in specimens from other organs, systems and tissues: Secondary | ICD-10-CM

## 2020-06-01 DIAGNOSIS — R809 Proteinuria, unspecified: Secondary | ICD-10-CM

## 2020-06-01 DIAGNOSIS — I10 Essential (primary) hypertension: Secondary | ICD-10-CM

## 2020-06-01 DIAGNOSIS — N186 End stage renal disease: Secondary | ICD-10-CM

## 2020-06-01 DIAGNOSIS — D472 Monoclonal gammopathy: Secondary | ICD-10-CM

## 2020-06-01 DIAGNOSIS — N19 Unspecified kidney failure: Secondary | ICD-10-CM

## 2020-06-01 DIAGNOSIS — D649 Anemia, unspecified: Secondary | ICD-10-CM

## 2020-06-01 NOTE — Progress Notes
Date of Service: 06/01/2020      Subjective:             Reason for Visit:  Follow Up      Jacqueline SEYOUM is a 54 y.o. female.Pt is here for a second opinion regarding her underlying plasma cell disorder. Pt also has renal failure and currently on HD. Pt is here to discuss proceeding to Kidney transplant     History of Present Illness  Onc Timeline Overview Note   Diagnosis: IgG kappa MGUS  Date of diagnosis: 08/2017  Clinical trial: No  ASCT: No  Mel: No  Cytogenetic/FISH: Normal    HPI:  54 year old female patient with a known history of chronic renal failure, diabetes mellitus, and recently diagnosed with MGUS.  [facility] for acute renal failure and was found that she was anemic.  During that period of time she had an extensive work-up that include laboratory test that showed WBC 9.2, hemoglobin 6.9, hematocrit 20.6, platelet 177, total protein 5.0, calcium 7.6, BUN 49, creatinine 9.3, IgA 73, IgG 871, IgM 44, serum M protein 0.4 g/dL, serum immunofixation IgG kappa paraprotein, skeletal survey not show any bony lytic lesions, bone marrow biopsy from outside facility was reviewed and it showed 5% kappa light chain restricted plasma cells was confirmed with the flow cytometry to be monoclonal plasma cell.  Cytogenetic was normal.  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  Patient came here for second opinion regarding her MGUS in order to evaluate if it is related to her renal failure in order to clear for renal transplant.     MGUS (monoclonal gammopathy of unknown significance)   08/18/2017 Initial Diagnosis    MGUS (monoclonal gammopathy of unknown significance)              Review of Systems   Constitutional: Negative for chills, fatigue and fever.   HENT: Negative for congestion and mouth sores.    Eyes: Negative for photophobia and visual disturbance.   Respiratory: Negative for apnea, cough, choking and wheezing.    Cardiovascular: Negative for chest pain, palpitations and leg swelling.   Gastrointestinal: Negative for abdominal pain, constipation, diarrhea, nausea and vomiting.   Genitourinary: Negative for dysuria and hematuria.   Musculoskeletal: Negative for back pain and neck pain.   Skin: Negative for pallor and rash.   Neurological: Negative for tremors, weakness and headaches.   Hematological: Negative for adenopathy. Does not bruise/bleed easily.   Psychiatric/Behavioral: Negative for agitation, behavioral problems and confusion.         Objective:         ? aspirin EC 81 mg tablet Take 81 mg by mouth daily. Take with food.   ? calcium acetate (PHOSLO) 667 mg capsule Take 667 mg by mouth three times daily.   ? fenofibrate micronized (LOFIBRA) 134 mg capsule Take 134 mg by mouth daily.   ? gentamicin 0.1 % topical cream Apply  topically to affected area daily as needed.   ? insulin glargine (LANTUS) 100 unit/mL injection Inject 90 Units under the skin daily.   ? metoprolol XL (TOPROL XL) 100 mg extended release tablet Take 100 mg by mouth daily.   ? nateglinide (STARLIX) 60 mg tablet Take 60 mg by mouth three times daily before meals.   ? NIFEdipine SR (PROCARDIA-XL; ADALAT CC)  60 mg tablet Take 60 mg by mouth daily.   ? TRULICITY 1.5 mg/0.5 mL injection pen Inject 1.5 mg under the skin every 7 days. Saturday     Vitals:    06/01/20 1408   BP: 136/64   BP Source: Arm, Right Upper   Patient Position: Sitting   Pulse: 76   Resp: 16   Temp: 36.7 ?C (98 ?F)   TempSrc: Temporal   SpO2: 100%   Weight: 112.9 kg (248 lb 12.8 oz)   Height: 172.7 cm (68)   PainSc: Zero     Body mass index is 37.83 kg/m?Marland Kitchen     Pain Score: Zero       Medical History:   Diagnosis Date   ? Abnormal biopsy of kidney    ? Anemia    ? Diabetes mellitus (HCC)    ? ESRD (end stage renal disease) (HCC)    ? Hypertension    ? Kidney failure    ? MGUS (monoclonal gammopathy of unknown significance)    ? Proteinuria      Surgical History:   Procedure Laterality Date   ? CATHETER IMPLANT/REVISION      PD cath   ? HX HYSTERECTOMY     ? HX LITHOTRIPSY       Family History   Problem Relation Age of Onset   ? Diabetes Mother    ? Hypertension Mother    ? Diabetes Father    ? Hypertension Father    ? Cancer-Hematologic Father    ? Diabetes Sister    ? Migraines Sister    ? Cancer Sister         Endometrial     Social History     Socioeconomic History   ? Marital status: Married     Spouse name: Not on file   ? Number of children: Not on file   ? Years of education: Not on file   ? Highest education level: Not on file   Occupational History   ? Not on file   Tobacco Use   ? Smoking status: Never Smoker   ? Smokeless tobacco: Never Used   Substance and Sexual Activity   ? Alcohol use: Not Currently   ? Drug use: Never   ? Sexual activity: Not on file   Other Topics Concern   ? Not on file   Social History Narrative   ? Not on file           Pain Addressed:  N/A    Patient Evaluated for a Clinical Trial: No treatment clinical trial available for this patient.     [place] Cooperative Oncology Group performance status is 1, Restricted in physically strenuous activity but ambulatory and able to carry out work of a light or sedentary nature, e.g., light house work, office work.     Physical Exam  Vitals reviewed.   Constitutional:       General: She is not in acute distress.     Appearance: She is well-developed.   HENT:      Head: Normocephalic and atraumatic.   Eyes:      Pupils: Pupils are equal, round, and reactive to light.   Cardiovascular:      Rate and Rhythm: Normal rate and regular rhythm.      Heart sounds: Normal heart sounds. No murmur heard.     Pulmonary:      Effort: Pulmonary effort is normal. No respiratory distress.  Breath sounds: Normal breath sounds. No wheezing or rales.   Abdominal:      General: Bowel sounds are normal. There is no distension. Palpations: Abdomen is soft.      Tenderness: There is no abdominal tenderness.   Musculoskeletal:         General: Normal range of motion.      Cervical back: Normal range of motion and neck supple.   Skin:     General: Skin is warm and dry.      Findings: No erythema or rash.   Neurological:      Mental Status: She is alert and oriented to person, place, and time.   Psychiatric:         Behavior: Behavior normal.            CBC w/Diff    Lab Results   Component Value Date/Time    WBC 8.0 05/25/2020 03:00 PM    RBC 4.26 05/25/2020 03:00 PM    HGB 12.4 05/25/2020 03:00 PM    HCT 37.1 05/25/2020 03:00 PM    MCV 87.1 05/25/2020 03:00 PM    MCH 29.1 05/25/2020 03:00 PM    MCHC 33.4 05/25/2020 03:00 PM    RDW 14.5 05/25/2020 03:00 PM    PLTCT 186 05/25/2020 03:00 PM    MPV 9.6 05/25/2020 03:00 PM    Lab Results   Component Value Date/Time    NEUT 70 05/25/2020 03:00 PM    ANC 5.60 05/25/2020 03:00 PM    LYMA 20 (L) 05/25/2020 03:00 PM    ALC 1.60 05/25/2020 03:00 PM    MONA 7 05/25/2020 03:00 PM    AMC 0.60 05/25/2020 03:00 PM    EOSA 2 05/25/2020 03:00 PM    AEC 0.20 05/25/2020 03:00 PM    BASA 1 05/25/2020 03:00 PM    ABC 0.10 05/25/2020 03:00 PM        Comprehensive Metabolic Profile    Lab Results   Component Value Date/Time    NA 133 (L) 05/25/2020 03:00 PM    K 3.7 05/25/2020 03:00 PM    CL 97 (L) 05/25/2020 03:00 PM    CO2 28 05/25/2020 03:00 PM    GAP 8 05/25/2020 03:00 PM    BUN 26 (H) 05/25/2020 03:00 PM    CR 2.63 (H) 05/25/2020 03:00 PM    GLU 575 (HH) 05/25/2020 03:00 PM    Lab Results   Component Value Date/Time    CA 7.5 (L) 05/25/2020 03:00 PM    PO4 3.4 05/25/2020 03:00 PM    ALBUMIN 3.7 05/25/2020 03:00 PM    TOTPROT 6.8 05/25/2020 03:00 PM    ALKPHOS 89 05/25/2020 03:00 PM    AST 23 05/25/2020 03:00 PM    ALT 31 05/25/2020 03:00 PM    TOTBILI 0.4 05/25/2020 03:00 PM    GFR 19 (L) 05/25/2020 03:00 PM    GFRAA 23 (L) 05/25/2020 03:00 PM        Lab Results   Component Value Date/Time    Immuno Fix-Serum IGG KAPPA PARAPROTEIN 11/28/2017 03:24 PM    Immuno Fix-URINE NO PARAPROTEIN SEEN 12/04/2017 08:20 AM    Kappa, FLC 8.07 (H) 05/25/2020 03:00 PM    Lambda, FLC 3.60 (H) 05/25/2020 03:00 PM    Kappa/Lambda FLC 2.24 (H) 05/25/2020 03:00 PM    B2 Microglobulin 11.5 (H) 11/28/2017 03:24 PM    Total Protein-SEP 6.6 05/25/2020 03:00 PM    Albumin % 55.4 05/25/2020 03:00 PM  Alpha 1 % 5.2 05/25/2020 03:00 PM    Alpha 2 % 13.2 05/25/2020 03:00 PM    Beta %,Serum 9.2 05/25/2020 03:00 PM    Gamma % 17.0 05/25/2020 03:00 PM    Paraprotein 0.56 05/25/2020 03:00 PM    Interpretation - SEP  05/25/2020 03:00 PM     SPIKE, PROBABLY MONOCLONAL, IN BETA/GAMMA REGION(S)          Assessment and Plan:  MGUS (monoclonal gammopathy of unknown significance)  A 54 year old female patient with a known history of IgG kappa MGUS, end-stage renal disease, diabetes mellitus, hypertension.  Patient presents to our clinic for further evaluation regarding her renal failure if she is eligible for renal transplant and if her kidney disease is related to her underlying plasma cell disorder.  Based on the results it showed that the patient had a bone marrow biopsy that showed 5% monoclonal plasma cell, serum M protein 0.4 g/dL, IgG 161, skeletal survey did not show any lytic lesions.    Per discussion at the Hem-path report there were no signs of underlying plasma cell disorder/Amyloidosis and the ESRD was secondary to her diabetes. Based on that pt has low risk MGUS with a low risk to progressed to myeloma    06/01/2020: Pt was sent to our clinic for evaluation regarding proceeding for kidney transplant. Per recent labs markers showed FKLC 8.1, FLLC 3.6, FKLC/FLLC ratio 2.2, serum M prtn 0.56. Pt BMBx showed 5% monoclonal PC in 2019. Pt has low risk MGUS with no changes in the markers for the past 2 years, no clinical S&S of Amyloidosis. I discuss with the pt that her risk of progressing to myeloma is still 1%/year and will be ok to proceed with kidney transplant.     Recommendations:  Ok to proceed with evaluation for Renal transplant per her nephrology team since she has low risk-MGUS   Pt will f/u with local Hematologist every 6 months for SPEP, sFLC and yearly 24 hr UPEP   RTC as needed     ESRD (end stage renal disease) (HCC)  Based on the kidney biopsy from outside facility showed nodular diabetic glomerular sclerosis with arteriosclerosis and a [place] red stain was negative for amyloid.  I will discuss the results of the kidney biopsy with our pathologist and the tumor board meeting in order to confirm if the patient has any signs of underlying plasma cell disorder causing her renal failure.  Patient is currently on dialysis and she is following nephrology for that    Recommendations:  Ok to proceed with evaluation for Renal transplant per her nephrology team     Diabetes mellitus (HCC)  Uncontrolled, insulin     Al-Ola Pearson Grippe, M.D   Assistant Professor of Internal medicine  Division of Hematologic Malignancies and Cellular Therapeutics  Pager [phone]

## 2020-06-01 NOTE — Patient Instructions
Your Care Team:    Dr. Serita Grammes   Hematologist   Multiple Myeloma and Plasma Cell Disorder Specialist    Ival Bible, APRN, DNP   Advanced Practice Nurse    Clinical Nurse Coordinators (CNCs)    Cassie Remker RN, BSN   Lowella Dandy RN, BSN   Cyril Mourning RN, BSN      ** If you have a question for our team, please send [place] a message on MyChart **    [email or web address]      Phone Numbers:    Scheduling # [phone]    Nurse # [phone]  Messages left on the nurses' line are checked Monday thru Friday 8:00 AM to 3:30 PM. If leaving a voicemail, please include your full name, date of birth and a brief message with the reason for your call.     Evening, weekend and holiday on-call # [phone]  For urgent needs after hours, please ask for the oncologist on-call to be paged.     Notes:  - Allow one business week for our office to complete any requested paperwork (FMLA, etc.) Fax to [phone]  - Allow three business days for all medication refills. Please call your pharmacy first to check for available refills.

## 2020-06-03 ENCOUNTER — Encounter: Admit: 2020-06-03 | Discharge: 2020-06-03

## 2020-06-15 ENCOUNTER — Encounter: Admit: 2020-06-15 | Discharge: 2020-06-15

## 2020-06-15 ENCOUNTER — Ambulatory Visit: Admit: 2020-06-15 | Discharge: 2020-06-15 | Payer: MEDICARE

## 2020-06-15 DIAGNOSIS — Z20822 Encounter for screening laboratory testing for COVID-19 virus in asymptomatic patient: Secondary | ICD-10-CM

## 2020-06-15 DIAGNOSIS — I10 Essential (primary) hypertension: Secondary | ICD-10-CM

## 2020-06-15 DIAGNOSIS — Z01818 Encounter for other preprocedural examination: Secondary | ICD-10-CM

## 2020-06-15 DIAGNOSIS — N186 End stage renal disease: Secondary | ICD-10-CM

## 2020-06-15 DIAGNOSIS — I739 Peripheral vascular disease, unspecified: Secondary | ICD-10-CM

## 2020-06-15 DIAGNOSIS — N19 Unspecified kidney failure: Secondary | ICD-10-CM

## 2020-06-15 DIAGNOSIS — R897 Abnormal histological findings in specimens from other organs, systems and tissues: Secondary | ICD-10-CM

## 2020-06-15 DIAGNOSIS — D649 Anemia, unspecified: Secondary | ICD-10-CM

## 2020-06-15 DIAGNOSIS — R9439 Abnormal result of other cardiovascular function study: Secondary | ICD-10-CM

## 2020-06-15 DIAGNOSIS — E0822 Diabetes mellitus due to underlying condition with diabetic chronic kidney disease: Secondary | ICD-10-CM

## 2020-06-15 DIAGNOSIS — D472 Monoclonal gammopathy: Secondary | ICD-10-CM

## 2020-06-15 DIAGNOSIS — E119 Type 2 diabetes mellitus without complications: Secondary | ICD-10-CM

## 2020-06-15 DIAGNOSIS — R809 Proteinuria, unspecified: Secondary | ICD-10-CM

## 2020-06-15 MED ORDER — NITROGLYCERIN 0.4 MG SL SUBL
.4 mg | SUBLINGUAL | 0 refills | PRN
Start: 2020-06-15 — End: ?

## 2020-06-15 MED ORDER — ALUMINUM-MAGNESIUM HYDROXIDE 200-200 MG/5 ML PO SUSP
30 mL | ORAL | 0 refills | PRN
Start: 2020-06-15 — End: ?

## 2020-06-15 MED ORDER — ASPIRIN 81 MG PO CHEW
324 mg | Freq: Once | ORAL | 0 refills
Start: 2020-06-15 — End: ?

## 2020-06-15 MED ORDER — MAGNESIUM HYDROXIDE 2,400 MG/10 ML PO SUSP
10 mL | ORAL | 0 refills | PRN
Start: 2020-06-15 — End: ?

## 2020-06-15 MED ORDER — ACETAMINOPHEN 325 MG PO TAB
650 mg | ORAL | 0 refills | PRN
Start: 2020-06-15 — End: ?

## 2020-06-15 MED ORDER — TEMAZEPAM 15 MG PO CAP
15 mg | Freq: Every evening | ORAL | 0 refills | PRN
Start: 2020-06-15 — End: ?

## 2020-06-15 NOTE — Patient Instructions
1. See Heart Catheterization instructions    2. Call to schedule your CT/Calcium Score    This can be scheduled at two of our imaging centers (see below). Insurance does not cover the cost of this screening. The cost of this test is $45. You can call and schedule the test at [phone], Option 1.     [facility]  [address], [place] [address]    [facility]   [place], [place] [address]    Indications for having a screening Cardiac Score may include:   High blood pressure (greater than 140/90)   Diabetes   Cigarette smoking   High cholesterol   Sedentary lifestyle (exercises less than 3x weekly)   Family history of heart disease   High stress lifestyle   Overweight by 20% or more   Chest Pain   At least 55 years of age with one of the above to proceed with Ca Score      Please send a MyChart message or call the Cardiology GOLD Team with questions, [phone], Kelby Fam, Doroteo Bradford RN, Wilford Sports, RN, [facility] LPN.     You may receive test results in MyChart before the ordering provider has reviewed them. Our care team will follow up with you after reviewing the tests to discuss your care. This may take up to 5-7 business days if results are not urgently needing to be addressed. Thank you for your patience.

## 2020-06-16 ENCOUNTER — Encounter: Admit: 2020-06-16 | Discharge: 2020-06-16 | Payer: MEDICARE

## 2020-06-16 DIAGNOSIS — R9439 Abnormal result of other cardiovascular function study: Secondary | ICD-10-CM

## 2020-06-16 DIAGNOSIS — Z01818 Encounter for other preprocedural examination: Secondary | ICD-10-CM

## 2020-06-17 ENCOUNTER — Encounter: Admit: 2020-06-17 | Discharge: 2020-06-17

## 2020-06-17 NOTE — Progress Notes
Medicare Primary No pre-certification is required.

## 2020-06-17 NOTE — Telephone Encounter
-----   Message from Carin Primrose, RN sent at 06/15/2020  4:18 PM CST -----  Regarding: Set up labs/COVID testing/Calcium score at [place] up labs/COVID testing/Calcium score at [facility] is on 12/3. COVID testing on 11/30 if possible. If not - talk to PCP office.     Calcium score is not urgent

## 2020-06-17 NOTE — Telephone Encounter
[facility]  Ph: [phone]    I was told to fax orders to admitting  Fax: [phone]    They cannot do COVID swab.  The orders need to come from PCP.      I faxed COVID order to Dr. Lanny Hurst.    Called pt and told her she needs to call hospital and PCP to set up testing.

## 2020-07-01 ENCOUNTER — Encounter: Admit: 2020-07-01 | Discharge: 2020-07-01

## 2020-07-01 DIAGNOSIS — Z01818 Encounter for other preprocedural examination: Secondary | ICD-10-CM

## 2020-07-01 DIAGNOSIS — I739 Peripheral vascular disease, unspecified: Secondary | ICD-10-CM

## 2020-07-01 DIAGNOSIS — E0822 Diabetes mellitus due to underlying condition with diabetic chronic kidney disease: Secondary | ICD-10-CM

## 2020-07-01 DIAGNOSIS — I10 Essential (primary) hypertension: Secondary | ICD-10-CM

## 2020-07-01 DIAGNOSIS — N186 End stage renal disease: Secondary | ICD-10-CM

## 2020-07-02 ENCOUNTER — Encounter: Admit: 2020-07-02 | Discharge: 2020-07-02

## 2020-07-02 DIAGNOSIS — N186 End stage renal disease: Secondary | ICD-10-CM

## 2020-07-02 DIAGNOSIS — Z20822 Encounter for screening laboratory testing for COVID-19 virus in asymptomatic patient: Secondary | ICD-10-CM

## 2020-07-02 DIAGNOSIS — E0822 Diabetes mellitus due to underlying condition with diabetic chronic kidney disease: Secondary | ICD-10-CM

## 2020-07-02 DIAGNOSIS — I10 Essential (primary) hypertension: Secondary | ICD-10-CM

## 2020-07-02 DIAGNOSIS — Z01818 Encounter for other preprocedural examination: Secondary | ICD-10-CM

## 2020-07-02 DIAGNOSIS — I739 Peripheral vascular disease, unspecified: Secondary | ICD-10-CM

## 2020-07-02 LAB — COVID-19 (SARS-COV-2) PCR: Lab: NEGATIVE

## 2020-07-02 NOTE — Telephone Encounter
Jacqueline Shields returned call. She states that she had her COVID swab done at her PCP's office, but that she did not have a documentation of her Negative result.     Called back to PCP and spoke with [facility]. I requested a letter stating the pt's NEG COVID result signed by Dr. Lanny Hurst. She stated that she could do that and fax it to [place]. Fax number provided.

## 2020-07-03 ENCOUNTER — Encounter: Admit: 2020-07-03 | Discharge: 2020-07-03

## 2020-07-09 ENCOUNTER — Encounter: Admit: 2020-07-09 | Discharge: 2020-07-09

## 2020-07-09 NOTE — Telephone Encounter
started lantus at [facility]     pcp dr Lanny Hurst at end of December     counselor

## 2020-07-10 ENCOUNTER — Encounter: Admit: 2020-07-10 | Discharge: 2020-07-10

## 2020-07-10 NOTE — Telephone Encounter
Coordinator discussed patient's case and inactive status with Dr. Tera Mater. Patient seen for update clinic visit in October 2021 and then completed updated echocardiogram and stress test. Stress test was abnormal, patient made inactive and referred to [facility] cardiology. Patient seen by Dr. Trudee Kuster and completed cardiac catheterization. Dr. Trudee Kuster completed a letter stating patient is low/acceptable risk for kidney transplant. Patient updated mammogram and pap smear, reviewed by Dr. Lenice Llamas. Patient endorsed symptoms of depression at clinic visit however states she was in a slump at that time and hasn't felt anxious or depressed. Patient continues to work full time, complete nightly PD treatments, attend all clinic appointments and has strong support with her husband. I completed a PHQ 9 screening with patient yesterday and she scored 0. Patient states if she has those symptoms again she plans to discuss with her PCP. Dr. Lenice Llamas advised that patient can be made active on the kidney transplant waitlist at this time. Coordinator called and spoke to [place] to review that her status has changed to active and she is eligible for organ offers. I reviewed that she could be called at any time for an organ offer and needs to keep her phone on and with her at all times. Elyn able to verbalize understanding and had no further questions at this time.     -Transplant tab changed to waitlist ACTIVE  -Status in UNET changed to [place] stating patient's status changed to ACTIVE drafted and given to MA to mail out   -E-mail sent to transplant team regarding status change to ACTIVE

## 2020-07-14 ENCOUNTER — Encounter: Admit: 2020-07-14 | Discharge: 2020-07-14

## 2020-07-14 ENCOUNTER — Inpatient Hospital Stay

## 2020-07-14 DIAGNOSIS — N186 End stage renal disease: Secondary | ICD-10-CM

## 2020-07-14 DIAGNOSIS — Z01818 Encounter for other preprocedural examination: Secondary | ICD-10-CM

## 2020-07-14 MED ORDER — MYCOPHENOLATE SODIUM 360 MG PO TBEC
720 mg | Freq: Once | ORAL | 0 refills
Start: 2020-07-14 — End: ?
  Administered 2020-07-15: 15:00:00 720 mg via ORAL

## 2020-07-14 MED ORDER — SODIUM CHLORIDE 0.45% WITH SODIUM BICARB IV INFUSION
Freq: Once | INTRAVENOUS | 0 refills
Start: 2020-07-14 — End: ?
  Administered 2020-07-15 (×2): 20 mL/h via INTRAVENOUS

## 2020-07-14 MED ORDER — INSULIN ASPART 100 UNIT/ML SC FLEXPEN
0-6 [IU] | Freq: Before meals | SUBCUTANEOUS | 0 refills
Start: 2020-07-14 — End: ?

## 2020-07-14 MED ORDER — SODIUM CHLORIDE 0.45% WITH SODIUM BICARB IV INFUSION
Freq: Once | INTRAVENOUS | 0 refills
Start: 2020-07-14 — End: ?

## 2020-07-14 MED ORDER — CEFAZOLIN INJ 1GM IVP
2 g | Freq: Once | INTRAVENOUS | 0 refills
Start: 2020-07-14 — End: ?
  Administered 2020-07-15: 17:00:00 2 g via INTRAVENOUS

## 2020-07-14 MED ORDER — ROSUVASTATIN 20 MG PO TAB
20 mg | Freq: Every day | ORAL | 0 refills
Start: 2020-07-14 — End: ?
  Administered 2020-07-15 – 2020-07-18 (×4): 20 mg via ORAL

## 2020-07-14 MED ORDER — METHYLPREDNISOLONE 500 MG IVPB
500 mg | Freq: Once | INTRAVENOUS | 0 refills
Start: 2020-07-14 — End: ?
  Administered 2020-07-15 (×2): 500 mg via INTRAVENOUS

## 2020-07-14 MED ORDER — HEPARIN 2500UNITS NS 250ML IRR SOLN (OR)
Freq: Once | 0 refills
Start: 2020-07-14 — End: ?

## 2020-07-15 ENCOUNTER — Inpatient Hospital Stay: Admit: 2020-07-15 | Discharge: 2020-07-15 | Payer: MEDICARE

## 2020-07-15 ENCOUNTER — Encounter: Admit: 2020-07-15 | Discharge: 2020-07-15 | Payer: Medicare Other

## 2020-07-15 ENCOUNTER — Encounter: Admit: 2020-07-15 | Discharge: 2020-07-15 | Payer: MEDICARE

## 2020-07-15 DIAGNOSIS — N19 Unspecified kidney failure: Secondary | ICD-10-CM

## 2020-07-15 DIAGNOSIS — D649 Anemia, unspecified: Secondary | ICD-10-CM

## 2020-07-15 DIAGNOSIS — D472 Monoclonal gammopathy: Secondary | ICD-10-CM

## 2020-07-15 DIAGNOSIS — R897 Abnormal histological findings in specimens from other organs, systems and tissues: Secondary | ICD-10-CM

## 2020-07-15 DIAGNOSIS — I2510 Atherosclerotic heart disease of native coronary artery without angina pectoris: Secondary | ICD-10-CM

## 2020-07-15 DIAGNOSIS — I10 Essential (primary) hypertension: Secondary | ICD-10-CM

## 2020-07-15 DIAGNOSIS — N186 End stage renal disease: Secondary | ICD-10-CM

## 2020-07-15 DIAGNOSIS — R809 Proteinuria, unspecified: Secondary | ICD-10-CM

## 2020-07-15 DIAGNOSIS — E119 Type 2 diabetes mellitus without complications: Secondary | ICD-10-CM

## 2020-07-15 LAB — POC GLUCOSE: Lab: 164 mg/dL — ABNORMAL HIGH (ref 70–100)

## 2020-07-15 MED ORDER — PROPOFOL INJ 10 MG/ML IV VIAL
INTRAVENOUS | 0 refills | Status: DC
Start: 2020-07-15 — End: 2020-07-15
  Administered 2020-07-15: 17:00:00 20 mg via INTRAVENOUS
  Administered 2020-07-15: 17:00:00 150 mg via INTRAVENOUS

## 2020-07-15 MED ORDER — MYCOPHENOLATE SODIUM 360 MG PO TBEC
720 mg | Freq: Once | ORAL | 0 refills | Status: CN
Start: 2020-07-15 — End: ?

## 2020-07-15 MED ORDER — LIDOCAINE (PF) 200 MG/10 ML (2 %) IJ SYRG
INTRAVENOUS | 0 refills | Status: DC
Start: 2020-07-15 — End: 2020-07-15
  Administered 2020-07-15: 17:00:00 100 mg via INTRAVENOUS

## 2020-07-15 MED ORDER — FENTANYL CITRATE (PF) 50 MCG/ML IJ SOLN
INTRAVENOUS | 0 refills | Status: DC
Start: 2020-07-15 — End: 2020-07-15
  Administered 2020-07-15: 17:00:00 50 ug via INTRAVENOUS

## 2020-07-15 MED ORDER — GLYCOPYRROLATE 0.2 MG/ML IJ SOLN
INTRAVENOUS | 0 refills | Status: DC
Start: 2020-07-15 — End: 2020-07-15
  Administered 2020-07-15: 19:00:00 .4 mg via INTRAVENOUS
  Administered 2020-07-15: 19:00:00 .2 mg via INTRAVENOUS

## 2020-07-15 MED ORDER — HEPARIN 2500UNITS NS 250ML IRR SOLN (OR)
Freq: Once | 0 refills | Status: DC
Start: 2020-07-15 — End: 2020-07-15

## 2020-07-15 MED ORDER — SODIUM CHLORIDE 0.45% WITH SODIUM BICARB IV INFUSION
Freq: Once | INTRAVENOUS | 0 refills | Status: CN
Start: 2020-07-15 — End: ?

## 2020-07-15 MED ORDER — HEPARIN 2500UNITS NS 250ML IRR SOLN (OR)
Freq: Once | 0 refills | Status: CN
Start: 2020-07-15 — End: ?

## 2020-07-15 MED ORDER — MANNITOL 25 % 25 % IV SOLN
INTRAVENOUS | 0 refills | Status: DC
Start: 2020-07-15 — End: 2020-07-15
  Administered 2020-07-15: 18:00:00 25 g via INTRAVENOUS

## 2020-07-15 MED ORDER — CEFAZOLIN INJ 1GM IVP
2 g | Freq: Once | INTRAVENOUS | 0 refills | Status: CN
Start: 2020-07-15 — End: ?

## 2020-07-15 MED ORDER — HYDROMORPHONE (PF) 2 MG/ML IJ SYRG
INTRAVENOUS | 0 refills | Status: DC
Start: 2020-07-15 — End: 2020-07-15
  Administered 2020-07-15 (×3): .2 mg via INTRAVENOUS

## 2020-07-15 MED ORDER — LYMPHOCYTE IMMUNE GLOBULIN (RAB) IVPB (PERIPHERAL)
1.5 mg/kg | Freq: Once | INTRAVENOUS | 0 refills | Status: CN
Start: 2020-07-15 — End: ?

## 2020-07-15 MED ORDER — ARTIFICIAL TEARS SINGLE DOSE DROPS GROUP
OPHTHALMIC | 0 refills | Status: DC
Start: 2020-07-15 — End: 2020-07-15
  Administered 2020-07-15: 17:00:00 2 [drp] via OPHTHALMIC

## 2020-07-15 MED ORDER — METHYLPREDNISOLONE 500 MG IVPB
500 mg | Freq: Once | INTRAVENOUS | 0 refills | Status: CN
Start: 2020-07-15 — End: ?

## 2020-07-15 MED ORDER — ONDANSETRON HCL (PF) 4 MG/2 ML IJ SOLN
INTRAVENOUS | 0 refills | Status: DC
Start: 2020-07-15 — End: 2020-07-15
  Administered 2020-07-15: 19:00:00 4 mg via INTRAVENOUS

## 2020-07-15 MED ORDER — MIDAZOLAM 1 MG/ML IJ SOLN
INTRAVENOUS | 0 refills | Status: DC
Start: 2020-07-15 — End: 2020-07-15
  Administered 2020-07-15: 17:00:00 2 mg via INTRAVENOUS

## 2020-07-15 MED ORDER — ELECTROLYTE-A IV SOLP
INTRAVENOUS | 0 refills | Status: DC
Start: 2020-07-15 — End: 2020-07-15
  Administered 2020-07-15: 17:00:00 via INTRAVENOUS

## 2020-07-15 MED ORDER — FUROSEMIDE 10 MG/ML IJ SOLN
INTRAVENOUS | 0 refills | Status: DC
Start: 2020-07-15 — End: 2020-07-15
  Administered 2020-07-15: 18:00:00 60 mg via INTRAVENOUS

## 2020-07-15 MED ORDER — NEOSTIGMINE METHYLSULFATE 1 MG/ML IJ SOLN
INTRAVENOUS | 0 refills | Status: DC
Start: 2020-07-15 — End: 2020-07-15
  Administered 2020-07-15: 19:00:00 3 mg via INTRAVENOUS
  Administered 2020-07-15: 19:00:00 2 mg via INTRAVENOUS

## 2020-07-15 MED ORDER — CISATRACURIUM 2 MG/ML IV SOLN
INTRAVENOUS | 0 refills | Status: DC
Start: 2020-07-15 — End: 2020-07-15
  Administered 2020-07-15: 17:00:00 6 mg via INTRAVENOUS

## 2020-07-15 MED ORDER — DEXAMETHASONE SODIUM PHOSPHATE 4 MG/ML IJ SOLN
INTRAVENOUS | 0 refills | Status: DC
Start: 2020-07-15 — End: 2020-07-15
  Administered 2020-07-15: 17:00:00 4 mg via INTRAVENOUS

## 2020-07-15 MED ORDER — ROCURONIUM 10 MG/ML IV SOLN
INTRAVENOUS | 0 refills | Status: DC
Start: 2020-07-15 — End: 2020-07-15
  Administered 2020-07-15: 17:00:00 30 mg via INTRAVENOUS

## 2020-07-15 MED ADMIN — SODIUM CHLORIDE 0.45 % IV SOLP [7318]: 1000.000 mL | INTRAVENOUS | @ 20:00:00 | Stop: 2020-07-16 | NDC 00338004304

## 2020-07-15 MED ADMIN — CEFAZOLIN INJ 500MG IVP [210318]: 500 mg | INTRAVENOUS | Stop: 2020-07-17 | NDC 00143992390

## 2020-07-15 MED ADMIN — POTASSIUM CHLORIDE 20 MEQ PO TBTQ [35943]: 20 meq | ORAL | Stop: 2020-07-16 | NDC 00245531989

## 2020-07-15 MED ADMIN — NYSTATIN 100,000 UNIT/ML PO SUSP [5751]: 500000 [IU] | ORAL | NDC 66689003701

## 2020-07-15 MED ADMIN — HYDROCORTISONE SOD SUCC (PF) 100 MG/2 ML IJ SOLR [302326]: 89.4 mL/h | INTRAVENOUS | @ 17:00:00 | Stop: 2020-07-15 | NDC 00009001103

## 2020-07-15 MED ADMIN — SODIUM CHLORIDE 0.9 % IV SOLP [27838]: 250 mL | @ 17:00:00 | Stop: 2020-07-15 | NDC 00338004902

## 2020-07-15 MED ADMIN — HEPARIN (PORCINE) 1,000 UNIT/ML IJ SOLN [10176]: 250 mL | @ 17:00:00 | Stop: 2020-07-15 | NDC 63323054011

## 2020-07-15 MED ADMIN — ANTI-THYMOCYTE GLOB (RABBIT) 25 MG IV SOLR [82321]: 89.4 mL/h | INTRAVENOUS | @ 17:00:00 | Stop: 2020-07-15 | NDC 58468008001

## 2020-07-15 MED ADMIN — SODIUM BICARBONATE 1 MEQ/ML (8.4 %) IV SOLN [7309]: 1000.000 mL | INTRAVENOUS | @ 20:00:00 | Stop: 2020-07-16 | NDC 51754501101

## 2020-07-15 MED ADMIN — SODIUM CHLORIDE 0.9 % IV SOLP [27838]: 89.4 mL/h | INTRAVENOUS | @ 17:00:00 | Stop: 2020-07-15 | NDC 00338004903

## 2020-07-15 MED ADMIN — INSULIN ASPART 100 UNIT/ML SC FLEXPEN [87504]: 4 [IU] | SUBCUTANEOUS | Stop: 2020-07-16 | NDC 00169633910

## 2020-07-15 MED ADMIN — HYDROMORPHONE (PF) 2 MG/ML IJ SYRG [163476]: 0.5 mg | INTRAVENOUS | @ 20:00:00 | Stop: 2020-07-15 | NDC 00409131203

## 2020-07-15 MED ADMIN — OXYCODONE 5 MG PO TAB [10814]: 10 mg | ORAL | @ 20:00:00 | Stop: 2020-07-15 | NDC 00904696661

## 2020-07-15 MED ADMIN — HEPARIN (PORCINE) 1,000 UNIT/ML IJ SOLN [10176]: 89.4 mL/h | INTRAVENOUS | @ 17:00:00 | Stop: 2020-07-15 | NDC 63323054001

## 2020-07-15 MED ADMIN — ONDANSETRON HCL (PF) 4 MG/2 ML IJ SOLN [136012]: 4 mg | INTRAVENOUS | NDC 36000001225

## 2020-07-15 MED ADMIN — SODIUM CHLORIDE 0.9 % IV SOLP [27838]: 1000 mL | INTRAVENOUS | @ 16:00:00 | Stop: 2020-07-17 | NDC 00338004904

## 2020-07-15 NOTE — Anesthesia Post-Procedure Evaluation
Post-Anesthesia Evaluation    Name: Jacqueline Shields      MRN: [id number]     DOB: Dec 22, 1965     Age: 54 y.o.     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 (N/A Abdomen)    Location: MAIN OR 57 / Main OR/Periop    Surgeons: Almon Register, MD          Post-Anesthesia Vitals  BP: 141/74 (12/15 1554)  Temp: 36.8 C (98.2 F) (12/15 1554)  Pulse: 71 (12/15 1554)  Respirations: 16 PER MINUTE (12/15 1554)  SpO2: 99 % (12/15 1554)   Vitals Value Taken Time   BP 133/79 07/15/20 1500   Temp 36.6 C (97.9 F) 07/15/20 1445   Pulse 70 07/15/20 1500   Respirations 16 PER MINUTE 07/15/20 1500   SpO2 92 % 07/15/20 1500   ABP     ART BP           Post Anesthesia Evaluation Note    Evaluation location: Pre/Post  Patient participation: recovered; patient participated in evaluation  Level of consciousness: alert    Pain score: 3  Pain management: adequate    Hydration: normovolemia  Temperature: 36.0C - 38.4C  Airway patency: adequate    Perioperative Events       Post-op nausea and vomiting: no PONV    Postoperative Status  Cardiovascular status: hemodynamically stable  Respiratory status: spontaneous ventilation        Perioperative Events  Perioperative Event: No  Emergency Case Activation: No

## 2020-07-15 NOTE — Telephone Encounter
Patient notified to come to hospital 07/15/2020 @ 0430 and proceed to admissions office. Advised to remain NPO by mouth after midnight. Patient provided call phone number for questions/concerns.     Notified Clarise Cruz Bed placement 2230  Notified Surgery Resident 2235  Notified Caryl Pina OR 2237  Notified [address] 2239    Transplant tab updated     No virtual xmatch ran  Physical xmatch ETA [id number]

## 2020-07-15 NOTE — Telephone Encounter
Received organ offer from [employer] number [id number] / Match Run number [id number].  Reviewed donor summary and match run list.  Organ donor has KDPI of 92%, patient notified.  Organ offer is KDPI 85% or greater.  Reviewed recipients Evaluation Education Agreement form.  Jacqueline Shields consented to Jacqueline Shields greater than 85%.  Jacqueline Shields is primary.  Reviewed recipient's history.  Notified Jacqueline Shields of organ offer, organ type and current status on list.  Jacqueline Shields verbalized agreement to proceed with organ offer.  Assessment negative for changes in medical status, social status, insurance changes, and use of anticoagulants. Jacqueline Shields notified to be on standby status and given coordinators contact information.      Discussed recent outbreak of corona virus or COVID 19.   Reviewed according to the CDC, those at increased risk for severe illness included immunocompromised, those with comorbid conditions such as cardiovascular disease and diabetes and those of advanced age.    Completed telephone screen for risk of infection exposure with patient:    ? Are you or have you been around anyone who is sick? (cough, muscle pain, vomiting, diarrhea, rash, weakness, abdominal pain, fever, red eye, bruising or bleeding, joint pain, severe headache) No  Explain:    ? Have you traveled internationally? No  Explain:    ? Have you traveled outside of [place] or [place] in the last 14 days? No  Explain:      Completed telephone screen for risk of infection exposure with identified care giver support with patient:    ? Are you or have you been around anyone who is sick? (cough, muscle pain, vomiting, diarrhea, rash, weakness, abdominal pain, fever, red eye, bruising or bleeding, joint pain, severe headache) No  Explain:    ? Have you traveled internationally? No  Explain:    ? Have you traveled outside of [place] or [place] in the last 14 days? No  Explain:      Advised patient of limited visitor policy.   Currently only one visitor allowed at this time. All visitors including accompanied care giver must meet above criteria (no illness, no recent travel exposure) and no children are allowed at this time.

## 2020-07-16 ENCOUNTER — Encounter: Admit: 2020-07-16 | Discharge: 2020-07-16 | Payer: Commercial Managed Care - PPO

## 2020-07-16 DIAGNOSIS — Z940 Kidney transplant status: Secondary | ICD-10-CM

## 2020-07-16 DIAGNOSIS — Z20828 Contact with and (suspected) exposure to other viral communicable diseases: Secondary | ICD-10-CM

## 2020-07-16 DIAGNOSIS — Z9189 Other specified personal risk factors, not elsewhere classified: Secondary | ICD-10-CM

## 2020-07-16 DIAGNOSIS — D849 Immunodeficiency, unspecified: Secondary | ICD-10-CM

## 2020-07-16 DIAGNOSIS — Z79899 Other long term (current) drug therapy: Secondary | ICD-10-CM

## 2020-07-16 MED ADMIN — MYCOPHENOLATE SODIUM 360 MG PO TBEC [89945]: 720 mg | ORAL | @ 15:00:00 | NDC 00904678661

## 2020-07-16 MED ADMIN — LIDOCAINE 5 % TP PTMD [80759]: 1 | TOPICAL | @ 15:00:00 | NDC 00591352530

## 2020-07-16 MED ADMIN — SODIUM CHLORIDE 0.45 % IV SOLP [7318]: 1000.000 mL | INTRAVENOUS | @ 15:00:00 | Stop: 2020-07-16 | NDC 00338004304

## 2020-07-16 MED ADMIN — SODIUM CHLORIDE 0.45 % IV SOLP [7318]: 1000.000 mL | INTRAVENOUS | @ 04:00:00 | NDC 00338004304

## 2020-07-16 MED ADMIN — TACROLIMUS 5 MG PO CAP [78307]: 5 mg | ORAL | @ 23:00:00 | NDC 00469065773

## 2020-07-16 MED ADMIN — NYSTATIN 100,000 UNIT/ML PO SUSP [5751]: 500000 [IU] | ORAL | @ 03:00:00 | NDC 66689003701

## 2020-07-16 MED ADMIN — TACROLIMUS 5 MG PO CAP [78307]: 5 mg | ORAL | NDC 00469065773

## 2020-07-16 MED ADMIN — SODIUM BICARBONATE 1 MEQ/ML (8.4 %) IV SOLN [7309]: 1000.000 mL | INTRAVENOUS | @ 04:00:00 | NDC 51754501101

## 2020-07-16 MED ADMIN — METHYLPREDNISOLONE SOD SUC(PF) 125 MG/2 ML IJ SOLR [301233]: 120 mg | INTRAVENOUS | @ 18:00:00 | Stop: 2020-07-16 | NDC 00009004725

## 2020-07-16 MED ADMIN — INSULIN GLARGINE 100 UNIT/ML (3 ML) SC INJ PEN [163596]: 60 [IU] | SUBCUTANEOUS | @ 16:00:00 | Stop: 2020-07-17 | NDC 00088221901

## 2020-07-16 MED ADMIN — NYSTATIN 100,000 UNIT/ML PO SUSP [5751]: 500000 [IU] | ORAL | @ 23:00:00 | NDC 66689003701

## 2020-07-16 MED ADMIN — ACETAMINOPHEN 325 MG PO TAB [101]: 325 mg | ORAL | NDC 00904677361

## 2020-07-16 MED ADMIN — DOCUSATE SODIUM 100 MG PO CAP [2566]: 100 mg | ORAL | @ 15:00:00 | NDC 00904699880

## 2020-07-16 MED ADMIN — ACETAMINOPHEN 325 MG PO TAB [101]: 325 mg | ORAL | @ 07:00:00 | NDC 00904677361

## 2020-07-16 MED ADMIN — DIPHENHYDRAMINE HCL 25 MG PO CAP [2509]: 25 mg | ORAL | @ 18:00:00 | Stop: 2020-07-16 | NDC 00904530661

## 2020-07-16 MED ADMIN — SODIUM BICARBONATE 1 MEQ/ML (8.4 %) IV SOLN [7309]: 1000.000 mL | INTRAVENOUS | @ 15:00:00 | Stop: 2020-07-16 | NDC 00409662522

## 2020-07-16 MED ADMIN — INSULIN REGULAR IN 0.9 % NACL 100 UNIT/100 ML (1 UNIT/ML) IV SOLN [451023]: 8 [IU]/h | INTRAVENOUS | @ 13:00:00 | Stop: 2020-07-16 | NDC 00338012612

## 2020-07-16 MED ADMIN — HYDROCORTISONE SOD SUCC (PF) 100 MG/2 ML IJ SOLR [302326]: 536.400 mL | INTRAVENOUS | @ 19:00:00 | Stop: 2020-07-17 | NDC 00009001103

## 2020-07-16 MED ADMIN — MYCOPHENOLATE SODIUM 360 MG PO TBEC [89945]: 720 mg | ORAL | NDC 00904678661

## 2020-07-16 MED ADMIN — HEPARIN (PORCINE) 1,000 UNIT/ML IJ SOLN [10176]: 536.400 mL | INTRAVENOUS | @ 19:00:00 | Stop: 2020-07-17 | NDC 63323054005

## 2020-07-16 MED ADMIN — INSULIN GLARGINE 100 UNIT/ML (3 ML) SC INJ PEN [163596]: 60 [IU] | SUBCUTANEOUS | @ 05:00:00 | NDC 00088221901

## 2020-07-16 MED ADMIN — PANTOPRAZOLE 40 MG IV SOLR [78621]: 40 mg | INTRAVENOUS | @ 03:00:00 | NDC 55150020200

## 2020-07-16 MED ADMIN — OXYCODONE 5 MG PO TAB [10814]: 5 mg | ORAL | @ 04:00:00 | NDC 00904696661

## 2020-07-16 MED ADMIN — FUROSEMIDE 10 MG/ML IJ SOLN [3291]: 80 mg | INTRAVENOUS | @ 19:00:00 | Stop: 2020-07-16 | NDC 36000028325

## 2020-07-16 MED ADMIN — NYSTATIN 100,000 UNIT/ML PO SUSP [5751]: 500000 [IU] | ORAL | @ 15:00:00 | NDC 66689003701

## 2020-07-16 MED ADMIN — SODIUM CHLORIDE 0.9 % IV SOLP [27838]: 500 mL | INTRAVENOUS | @ 19:00:00 | Stop: 2020-07-16 | NDC 00338004903

## 2020-07-16 MED ADMIN — INSULIN REGULAR IN 0.9 % NACL 100 UNIT/100 ML (1 UNIT/ML) IV SOLN [451023]: 2.5 [IU]/h | INTRAVENOUS | @ 01:00:00 | NDC 00338012612

## 2020-07-16 MED ADMIN — CEFAZOLIN INJ 500MG IVP [210318]: 500 mg | INTRAVENOUS | @ 12:00:00 | Stop: 2020-07-17 | NDC 00143992390

## 2020-07-16 MED ADMIN — CEFAZOLIN INJ 500MG IVP [210318]: 500 mg | INTRAVENOUS | @ 23:00:00 | Stop: 2020-07-17 | NDC 00143992390

## 2020-07-16 MED ADMIN — TACROLIMUS 5 MG PO CAP [78307]: 5 mg | ORAL | @ 12:00:00 | NDC 00469065773

## 2020-07-16 MED ADMIN — NYSTATIN 100,000 UNIT/ML PO SUSP [5751]: 500000 [IU] | ORAL | @ 19:00:00 | NDC 66689003701

## 2020-07-16 MED ADMIN — SODIUM CHLORIDE 0.9 % IV SOLP [27838]: 536.400 mL | INTRAVENOUS | @ 19:00:00 | Stop: 2020-07-17 | NDC 00338004903

## 2020-07-16 MED ADMIN — ACETAMINOPHEN 325 MG PO TAB [101]: 650 mg | ORAL | @ 18:00:00 | Stop: 2020-07-16 | NDC 00904677361

## 2020-07-16 MED ADMIN — ANTI-THYMOCYTE GLOB (RABBIT) 25 MG IV SOLR [82321]: 536.400 mL | INTRAVENOUS | @ 19:00:00 | Stop: 2020-07-17 | NDC 58468008001

## 2020-07-16 MED ADMIN — POLYETHYLENE GLYCOL 3350 17 GRAM PO PWPK [25424]: 17 g | ORAL | @ 15:00:00 | NDC 00904693186

## 2020-07-17 ENCOUNTER — Encounter: Admit: 2020-07-17 | Discharge: 2020-07-17 | Payer: Commercial Managed Care - PPO

## 2020-07-17 DIAGNOSIS — N186 End stage renal disease: Secondary | ICD-10-CM

## 2020-07-17 DIAGNOSIS — D472 Monoclonal gammopathy: Secondary | ICD-10-CM

## 2020-07-17 DIAGNOSIS — I10 Essential (primary) hypertension: Secondary | ICD-10-CM

## 2020-07-17 DIAGNOSIS — D649 Anemia, unspecified: Secondary | ICD-10-CM

## 2020-07-17 DIAGNOSIS — I2510 Atherosclerotic heart disease of native coronary artery without angina pectoris: Secondary | ICD-10-CM

## 2020-07-17 DIAGNOSIS — R809 Proteinuria, unspecified: Secondary | ICD-10-CM

## 2020-07-17 DIAGNOSIS — E119 Type 2 diabetes mellitus without complications: Secondary | ICD-10-CM

## 2020-07-17 DIAGNOSIS — R897 Abnormal histological findings in specimens from other organs, systems and tissues: Secondary | ICD-10-CM

## 2020-07-17 DIAGNOSIS — N19 Unspecified kidney failure: Secondary | ICD-10-CM

## 2020-07-17 MED ADMIN — LIDOCAINE 5 % TP PTMD [80759]: 1 | TOPICAL | @ 17:00:00 | NDC 00591352530

## 2020-07-17 MED ADMIN — MYCOPHENOLATE SODIUM 360 MG PO TBEC [89945]: 720 mg | ORAL | @ 17:00:00 | NDC 00904678661

## 2020-07-17 MED ADMIN — DOCUSATE SODIUM 100 MG PO CAP [2566]: 100 mg | ORAL | @ 03:00:00 | NDC 00904699880

## 2020-07-17 MED ADMIN — NYSTATIN 100,000 UNIT/ML PO SUSP [5751]: 500000 [IU] | ORAL | @ 03:00:00 | NDC 66689003701

## 2020-07-17 MED ADMIN — PANTOPRAZOLE 40 MG PO TBEC [80436]: 40 mg | ORAL | @ 03:00:00 | NDC 00904647461

## 2020-07-17 MED ADMIN — TACROLIMUS 5 MG PO CAP [78307]: 5 mg | ORAL | @ 12:00:00 | Stop: 2020-07-17 | NDC 00469065773

## 2020-07-17 MED ADMIN — TACROLIMUS 5 MG PO CAP [78307]: 6 mg | ORAL | NDC 00469065773

## 2020-07-17 MED ADMIN — CEPHALEXIN 500 MG PO CAP [9500]: 500 mg | ORAL | @ 12:00:00 | NDC 50268015211

## 2020-07-17 MED ADMIN — DOCUSATE SODIUM 100 MG PO CAP [2566]: 100 mg | ORAL | @ 17:00:00 | NDC 00904699880

## 2020-07-17 MED ADMIN — HYDROCORTISONE SOD SUCC (PF) 100 MG/2 ML IJ SOLR [302326]: 536.400 mL | INTRAVENOUS | @ 21:00:00 | Stop: 2020-07-18 | NDC 00009001103

## 2020-07-17 MED ADMIN — ANTI-THYMOCYTE GLOB (RABBIT) 25 MG IV SOLR [82321]: 536.400 mL | INTRAVENOUS | @ 21:00:00 | Stop: 2020-07-18 | NDC 58468008001

## 2020-07-17 MED ADMIN — NYSTATIN 100,000 UNIT/ML PO SUSP [5751]: 500000 [IU] | ORAL | NDC 66689003701

## 2020-07-17 MED ADMIN — DIPHENHYDRAMINE HCL 25 MG PO CAP [2509]: 25 mg | ORAL | @ 20:00:00 | Stop: 2020-07-17 | NDC 00904530661

## 2020-07-17 MED ADMIN — ACETAMINOPHEN 325 MG PO TAB [101]: 650 mg | ORAL | @ 20:00:00 | Stop: 2020-07-17 | NDC 00904677361

## 2020-07-17 MED ADMIN — ERGOCALCIFEROL (VITAMIN D2) 1,250 MCG (50,000 UNIT) PO CAP [81876]: 50000 [IU] | ORAL | @ 17:00:00 | NDC 60687050011

## 2020-07-17 MED ADMIN — NYSTATIN 100,000 UNIT/ML PO SUSP [5751]: 500000 [IU] | ORAL | @ 17:00:00 | NDC 66689003701

## 2020-07-17 MED ADMIN — HEPARIN (PORCINE) 1,000 UNIT/ML IJ SOLN [10176]: 536.400 mL | INTRAVENOUS | @ 21:00:00 | Stop: 2020-07-18 | NDC 63323054005

## 2020-07-17 MED ADMIN — CEPHALEXIN 500 MG PO CAP [9500]: 500 mg | ORAL | NDC 50268015211

## 2020-07-17 MED ADMIN — METHYLPREDNISOLONE SOD SUC(PF) 125 MG/2 ML IJ SOLR [301233]: 80 mg | INTRAVENOUS | @ 20:00:00 | Stop: 2020-07-17 | NDC 00009004725

## 2020-07-17 MED ADMIN — INSULIN ASPART 100 UNIT/ML SC FLEXPEN [87504]: 8 [IU] | SUBCUTANEOUS | @ 17:00:00 | NDC 00169633910

## 2020-07-17 MED ADMIN — SODIUM CHLORIDE 0.9 % IV SOLP [27838]: 536.400 mL | INTRAVENOUS | @ 21:00:00 | Stop: 2020-07-18 | NDC 00338004903

## 2020-07-17 MED ADMIN — TACROLIMUS 1 MG PO CAP [82570]: 6 mg | ORAL | NDC 00469061773

## 2020-07-17 MED ADMIN — NYSTATIN 100,000 UNIT/ML PO SUSP [5751]: 500000 [IU] | ORAL | @ 20:00:00 | NDC 66689003701

## 2020-07-17 MED ADMIN — MYCOPHENOLATE SODIUM 360 MG PO TBEC [89945]: 720 mg | ORAL | @ 03:00:00 | NDC 00904678661

## 2020-07-17 MED ADMIN — POLYETHYLENE GLYCOL 3350 17 GRAM PO PWPK [25424]: 17 g | ORAL | @ 17:00:00 | NDC 00904693186

## 2020-07-18 ENCOUNTER — Encounter: Admit: 2020-07-18 | Discharge: 2020-07-18 | Payer: Commercial Managed Care - PPO

## 2020-07-18 MED ADMIN — SODIUM CHLORIDE 0.9 % IV SOLP [27838]: 2 g | INTRAVENOUS | @ 19:00:00 | Stop: 2020-07-18 | NDC 00338004938

## 2020-07-18 MED ADMIN — NYSTATIN 100,000 UNIT/ML PO SUSP [5751]: 500000 [IU] | ORAL | @ 19:00:00 | Stop: 2020-07-19 | NDC 66689003701

## 2020-07-18 MED ADMIN — CALCIUM GLUCONATE 100 MG/ML (10%) IV SOLN [1312]: 2 g | INTRAVENOUS | @ 19:00:00 | Stop: 2020-07-18 | NDC 63323036001

## 2020-07-18 MED ADMIN — TACROLIMUS 1 MG PO CAP [82570]: 6 mg | ORAL | @ 12:00:00 | Stop: 2020-07-19 | NDC 00469061773

## 2020-07-18 MED ADMIN — METHYLPREDNISOLONE SOD SUC(PF) 40 MG/ML IJ SOLR [301232]: 40 mg | INTRAVENOUS | @ 19:00:00 | Stop: 2020-07-18 | NDC 00009003930

## 2020-07-18 MED ADMIN — TACROLIMUS 5 MG PO CAP [78307]: 6 mg | ORAL | @ 12:00:00 | Stop: 2020-07-19 | NDC 00469065773

## 2020-07-18 MED ADMIN — PANTOPRAZOLE 40 MG PO TBEC [80436]: 40 mg | ORAL | @ 03:00:00 | NDC 00904647461

## 2020-07-18 MED ADMIN — NYSTATIN 100,000 UNIT/ML PO SUSP [5751]: 500000 [IU] | ORAL | @ 03:00:00 | NDC 66689003701

## 2020-07-18 MED ADMIN — MYCOPHENOLATE SODIUM 360 MG PO TBEC [89945]: 720 mg | ORAL | @ 14:00:00 | Stop: 2020-07-19 | NDC 00904678661

## 2020-07-18 MED ADMIN — CEPHALEXIN 500 MG PO CAP [9500]: 500 mg | ORAL | @ 12:00:00 | Stop: 2020-07-19 | NDC 50268015211

## 2020-07-18 MED ADMIN — NYSTATIN 100,000 UNIT/ML PO SUSP [5751]: 500000 [IU] | ORAL | @ 14:00:00 | Stop: 2020-07-19 | NDC 66689003701

## 2020-07-18 MED ADMIN — MYCOPHENOLATE SODIUM 360 MG PO TBEC [89945]: 720 mg | ORAL | @ 03:00:00 | NDC 00904678661

## 2020-07-18 MED FILL — SULFAMETHOXAZOLE-TRIMETHOPRIM 400-80 MG PO TAB: 80/400 mg | ORAL | 30 days supply | Qty: 30 | Fill #1 | Status: CP

## 2020-07-18 MED FILL — ACYCLOVIR 400 MG PO TAB: 400 mg | ORAL | 30 days supply | Qty: 60 | Fill #1 | Status: CP

## 2020-07-18 MED FILL — TACROLIMUS 1 MG PO CAP: 1 mg | ORAL | 30 days supply | Qty: 480 | Fill #1 | Status: CP

## 2020-07-18 MED FILL — PEN NEEDLE, DIABETIC 32 GAUGE X 5/32" MISC NDLE: 32 gauge x 5/" | 60 days supply | Qty: 300 | Fill #1 | Status: CP

## 2020-07-18 MED FILL — NYSTATIN 100,000 UNIT/ML PO SUSP: 100000 units/mL | ORAL | 7 days supply | Qty: 140 | Fill #1 | Status: CP

## 2020-07-18 MED FILL — FAMOTIDINE 20 MG PO TAB: 20 mg | ORAL | 30 days supply | Qty: 30 | Fill #1 | Status: CP

## 2020-07-18 MED FILL — INSULIN GLARGINE 100 UNIT/ML (3 ML) SC INJ PEN: 100 unit/mL (3 mL) | SUBCUTANEOUS | 25 days supply | Qty: 45 | Fill #1 | Status: CP

## 2020-07-18 MED FILL — MYCOPHENOLATE SODIUM 180 MG PO TBEC: 180 mg | ORAL | 30 days supply | Qty: 240 | Fill #1 | Status: CP

## 2020-07-18 MED FILL — INSULIN ASPART 100 UNIT/ML SC FLEXPEN: 100 unit/mL (3 mL) | 18 days supply | Qty: 15 | Fill #1 | Status: CP

## 2020-07-18 MED FILL — PREDNISONE 5 MG PO TAB: 5 mg | 4 days supply | Qty: 16 | Fill #1 | Status: CP

## 2020-07-19 ENCOUNTER — Encounter: Admit: 2020-07-19 | Discharge: 2020-07-19 | Payer: Commercial Managed Care - PPO

## 2020-07-19 NOTE — Telephone Encounter
Returned pts daughter's call.  Pt has drainage from incision.  Drainage is clear to brown tinge.  Discussed how drainage is not unexpected.  Discussed wound care and signs and symptoms of infection.  Routed to pts coordinator.

## 2020-07-20 ENCOUNTER — Encounter: Admit: 2020-07-20 | Discharge: 2020-07-20 | Payer: Commercial Managed Care - PPO

## 2020-07-20 ENCOUNTER — Ambulatory Visit: Admit: 2020-07-20 | Discharge: 2020-07-20 | Payer: MEDICARE

## 2020-07-20 DIAGNOSIS — Z940 Kidney transplant status: Secondary | ICD-10-CM

## 2020-07-20 DIAGNOSIS — D849 Immunodeficiency, unspecified: Secondary | ICD-10-CM

## 2020-07-20 DIAGNOSIS — Z79899 Other long term (current) drug therapy: Secondary | ICD-10-CM

## 2020-07-20 LAB — COMPREHENSIVE METABOLIC PANEL
Lab: 12 K/UL — ABNORMAL LOW (ref 3–12)
Lab: 144 MMOL/L — ABNORMAL LOW (ref 137–147)
Lab: 198 mg/dL — ABNORMAL HIGH (ref 70–100)
Lab: 2.6 mg/dL — ABNORMAL HIGH (ref 0.4–1.00)
Lab: 20 U/L — ABNORMAL HIGH (ref 7–56)
Lab: 24 MMOL/L (ref 21–30)
Lab: 3.1 g/dL — ABNORMAL LOW (ref 3.5–5.0)
Lab: 3.7 MMOL/L — ABNORMAL LOW (ref 3.5–5.1)
Lab: 49 mg/dL — ABNORMAL HIGH (ref 7–25)
Lab: 58 U/L (ref 25–110)
Lab: 6.6 mg/dL — ABNORMAL LOW (ref 8.5–10.6)

## 2020-07-20 LAB — BK VIRUS DNA, QUANT PLASMA

## 2020-07-20 LAB — MAGNESIUM: Lab: 1.6 mg/dL (ref 1.6–2.6)

## 2020-07-20 LAB — CBC AND DIFF
Lab: 0 K/UL (ref 0–0.20)
Lab: 0 K/UL (ref 0–0.45)
Lab: 0.8 K/UL — ABNORMAL HIGH (ref >60)
Lab: 1 % (ref 0–5)
Lab: 3.4 M/UL — ABNORMAL LOW (ref 4.0–5.0)
Lab: 9 K/UL (ref 4.5–11.0)
Lab: 9.6 FL — ABNORMAL LOW (ref 7–11)

## 2020-07-20 NOTE — Telephone Encounter
Reviewed pt as new transplant recipient recently d/c'd from hospital. Reviewed labs from 12/18. Creatinine 3.06, SGF. FK 5.7, med list with Prograf 8/8, confirmed with pharmacy note and Med action plan. Calcium critically low at 5.9, ionized 0.89. Reviewed last note from nephrologist and reported that pt is asymptomatic and pt was given calcium gluconate IV.   Called pt to follow up after hospital discharge. Pt reports feeling OK, pt states she has "shakiness" that is new for her. Pt unsure if related to blood sugar, pt will check. Pt reports good BPs. Pt states UO was 3L yesterday.   Reviewed pt's shakiness, UO, labs with Dr. Earney Hamburg. Recommended pt repeat labs today to f/u on creatinine and calcium level.   Called pt back, pt already took tacrolimus today so level will not be accurate. Pt able to complete labs today. Pt reports blood sugar is 120 after eating and pt is unsure about taking insulin. Discussed that OK for pt to hold AM insulin and recheck at lunchtime. Pt verbalized understanding. Pt scheduled for lab appt today.

## 2020-07-22 ENCOUNTER — Encounter: Admit: 2020-07-22 | Discharge: 2020-07-22 | Payer: Commercial Managed Care - PPO

## 2020-07-22 ENCOUNTER — Ambulatory Visit: Admit: 2020-07-22 | Discharge: 2020-07-22 | Payer: MEDICARE

## 2020-07-22 ENCOUNTER — Ambulatory Visit: Admit: 2020-07-22 | Discharge: 2020-07-22 | Payer: Commercial Managed Care - PPO

## 2020-07-22 DIAGNOSIS — D849 Immunodeficiency, unspecified: Secondary | ICD-10-CM

## 2020-07-22 DIAGNOSIS — R897 Abnormal histological findings in specimens from other organs, systems and tissues: Secondary | ICD-10-CM

## 2020-07-22 DIAGNOSIS — D649 Anemia, unspecified: Secondary | ICD-10-CM

## 2020-07-22 DIAGNOSIS — D472 Monoclonal gammopathy: Secondary | ICD-10-CM

## 2020-07-22 DIAGNOSIS — N19 Unspecified kidney failure: Secondary | ICD-10-CM

## 2020-07-22 DIAGNOSIS — E119 Type 2 diabetes mellitus without complications: Secondary | ICD-10-CM

## 2020-07-22 DIAGNOSIS — Z940 Kidney transplant status: Secondary | ICD-10-CM

## 2020-07-22 DIAGNOSIS — I10 Essential (primary) hypertension: Secondary | ICD-10-CM

## 2020-07-22 DIAGNOSIS — I2510 Atherosclerotic heart disease of native coronary artery without angina pectoris: Secondary | ICD-10-CM

## 2020-07-22 DIAGNOSIS — R809 Proteinuria, unspecified: Secondary | ICD-10-CM

## 2020-07-22 DIAGNOSIS — Z79899 Other long term (current) drug therapy: Secondary | ICD-10-CM

## 2020-07-22 DIAGNOSIS — N186 End stage renal disease: Secondary | ICD-10-CM

## 2020-07-22 LAB — CBC AND DIFF
Lab: 0 % (ref 0–2)
Lab: 0 K/UL (ref 0–0.20)
Lab: 1.1 K/UL — ABNORMAL HIGH (ref 0–0.80)
Lab: 10 g/dL — ABNORMAL LOW (ref 12.0–15.0)
Lab: 12 K/UL — ABNORMAL HIGH (ref 1.8–7.0)
Lab: 13 K/UL — ABNORMAL HIGH (ref 4.5–11.0)
Lab: 15 % — ABNORMAL HIGH (ref 11–15)
Lab: 171 K/UL (ref 150–400)
Lab: 2 % — ABNORMAL LOW (ref 24–44)
Lab: 28 pg (ref 26–34)
Lab: 3.8 M/UL — ABNORMAL LOW (ref 4.0–5.0)
Lab: 32 % — ABNORMAL LOW (ref 36–45)
Lab: 33 g/dL (ref 32.0–36.0)
Lab: 8 % (ref 4–12)
Lab: 85 FL (ref 80–100)
Lab: 89 % — ABNORMAL HIGH (ref >60)
Lab: 9.3 FL (ref 7–11)

## 2020-07-22 LAB — URINALYSIS DIPSTICK REFLEX TO CULTURE
Lab: 1 mg/dL (ref 1.005–1.030)
Lab: 6 mg/dL (ref 5.0–8.0)
Lab: NEGATIVE
Lab: NEGATIVE
Lab: NEGATIVE
Lab: NEGATIVE mL/min
Lab: NEGATIVE mg/dL (ref 8.5–10.6)

## 2020-07-22 LAB — COMPREHENSIVE METABOLIC PANEL
Lab: 105 mg/dL — ABNORMAL HIGH (ref 70–100)
Lab: 107 MMOL/L (ref 98–110)
Lab: 144 MMOL/L (ref 137–147)
Lab: 3.1 MMOL/L — ABNORMAL LOW (ref 3.5–5.1)
Lab: 36 mg/dL — ABNORMAL HIGH (ref 7–25)

## 2020-07-22 LAB — PROTEIN/CR RATIO,UR RAN
Lab: 0.3
Lab: 17 mg/dL
Lab: 54 mg/dL — AB

## 2020-07-22 LAB — PHOSPHORUS: Lab: 5 mg/dL — ABNORMAL HIGH (ref 2.0–4.5)

## 2020-07-22 LAB — URINALYSIS MICROSCOPIC REFLEX TO CULTURE

## 2020-07-22 LAB — URIC ACID: Lab: 7.9 mg/dL — ABNORMAL HIGH (ref 2.0–7.0)

## 2020-07-22 LAB — MAGNESIUM: Lab: 1.5 mg/dL — ABNORMAL LOW (ref 1.6–2.6)

## 2020-07-22 MED ORDER — CHOLECALCIFEROL (VITAMIN D3) 50 MCG (2,000 UNIT) PO TAB
2000 [IU] | ORAL_TABLET | Freq: Every day | ORAL | 3 refills | 84.00000 days | Status: AC
Start: 2020-07-22 — End: ?

## 2020-07-22 NOTE — Patient Instructions
Labs Sunday 12/26 at [facility], [place], [place] [address]    Labs Wednesday, 12/29 at [facility] for Transplant.

## 2020-07-23 ENCOUNTER — Encounter: Admit: 2020-07-23 | Discharge: 2020-07-23 | Payer: Commercial Managed Care - PPO

## 2020-07-23 MED ORDER — TACROLIMUS 1 MG PO CAP
7 mg | ORAL_CAPSULE | Freq: Two times a day (BID) | ORAL | 11 refills | Status: CN
Start: 2020-07-23 — End: ?

## 2020-07-23 NOTE — Telephone Encounter
Reviewed pt's labs from 07/22/20 with Dr. Earney Hamburg. Creatinine improving at 2.34. FK slightly supratherapeutic at 12.7. Will decrease Prograf from 8/8 to 7/7. Called pt with medication dose change, pt read back correctly. Pt will repeat labs on Sunday, 12/26.

## 2020-07-23 NOTE — Telephone Encounter
-----   Message from Fabio Neighbors, MD sent at 07/23/2020 10:56 AM CST -----  7/7  ----- Message -----  From: Burnis Kingfisher, RN  Sent: 07/23/2020   9:59 AM CST  To: Fabio Neighbors, MD    FK 12.7 on Prograf 8/8

## 2020-07-29 ENCOUNTER — Encounter: Admit: 2020-07-29 | Discharge: 2020-07-29 | Payer: MEDICARE

## 2020-07-29 ENCOUNTER — Ambulatory Visit: Admit: 2020-07-29 | Discharge: 2020-07-29 | Payer: MEDICARE

## 2020-07-29 DIAGNOSIS — Z79899 Other long term (current) drug therapy: Secondary | ICD-10-CM

## 2020-07-29 DIAGNOSIS — R059 Coughing: Secondary | ICD-10-CM

## 2020-07-29 DIAGNOSIS — Z940 Kidney transplant status: Secondary | ICD-10-CM

## 2020-07-29 DIAGNOSIS — Z20822 Encounter for screening laboratory testing for COVID-19 virus in asymptomatic patient: Secondary | ICD-10-CM

## 2020-07-29 DIAGNOSIS — D849 Immunodeficiency, unspecified: Secondary | ICD-10-CM

## 2020-07-29 LAB — URINALYSIS MICROSCOPIC REFLEX TO CULTURE

## 2020-07-29 LAB — COMPREHENSIVE METABOLIC PANEL
Lab: 0.4 mg/dL — ABNORMAL HIGH (ref 0.3–1.2)
Lab: 12 U/L (ref 7–40)
Lab: 14 K/UL — ABNORMAL HIGH (ref 3–12)
Lab: 142 MMOL/L — ABNORMAL LOW (ref 137–147)
Lab: 15 U/L — ABNORMAL HIGH (ref 7–56)
Lab: 183 mg/dL — ABNORMAL HIGH (ref 70–100)
Lab: 2.6 mg/dL — ABNORMAL HIGH (ref 0.4–1.00)
Lab: 21 mL/min — ABNORMAL LOW (ref >60)
Lab: 22 MMOL/L (ref 21–30)
Lab: 3.5 MMOL/L — ABNORMAL LOW (ref 3.5–5.1)
Lab: 3.6 g/dL — ABNORMAL LOW (ref 3.5–5.0)
Lab: 6.1 g/dL (ref 6.0–8.0)
Lab: 70 U/L (ref 25–110)

## 2020-07-29 LAB — CBC AND DIFF
Lab: 0.1 K/UL (ref 0–0.20)
Lab: 0.2 K/UL (ref 0–0.45)
Lab: 10 K/UL (ref 4.5–11.0)
Lab: 3.7 M/UL — ABNORMAL LOW (ref 4.0–5.0)

## 2020-07-29 LAB — URINALYSIS DIPSTICK REFLEX TO CULTURE
Lab: 1 (ref 1.005–1.030)
Lab: NEGATIVE
Lab: NEGATIVE
Lab: NEGATIVE

## 2020-07-29 LAB — MAGNESIUM: Lab: 1.3 mg/dL — ABNORMAL LOW (ref 1.6–2.6)

## 2020-07-29 LAB — URIC ACID: Lab: 5.5 mg/dL — ABNORMAL LOW (ref 2.0–7.0)

## 2020-07-29 LAB — PROTEIN/CR RATIO,UR RAN
Lab: 37 mg/dL
Lab: 80 mg/dL

## 2020-07-29 LAB — PHOSPHORUS: Lab: 3.4 mg/dL — ABNORMAL HIGH (ref 2.0–4.5)

## 2020-07-29 MED ORDER — INSULIN GLARGINE 100 UNIT/ML (3 ML) SC INJ PEN
65 [IU] | Freq: Two times a day (BID) | SUBCUTANEOUS | 1 refills | Status: CN
Start: 2020-07-29 — End: ?

## 2020-07-29 MED ORDER — SODIUM CHLORIDE 0.9 % IV SOLP
1000 mL | INTRAVENOUS | 0 refills | Status: CP
Start: 2020-07-29 — End: ?
  Administered 2020-07-29: 17:00:00 1000 mL via INTRAVENOUS

## 2020-07-29 MED ORDER — CALCITRIOL 0.25 MCG PO CAP
0.25 ug | ORAL_CAPSULE | Freq: Every day | ORAL | 3 refills | 30.00000 days | Status: AC
Start: 2020-07-29 — End: ?
  Filled 2020-08-03: qty 90, 90d supply, fill #1

## 2020-07-29 NOTE — Patient Instructions
Return to see Dr. Lyndel Safe in 1 week    Repeat labs Monday and Wednesday    Further testing needs: chest x-ray (you will go up to the 2nd floor radiology department)    Medication changes: Increase Lantus to 65 units twice daily  Start taking Calcitriol 0.25 mcg once daily (Rx sent to [facility] on the ground floor)

## 2020-07-30 ENCOUNTER — Encounter: Admit: 2020-07-30 | Discharge: 2020-07-30 | Payer: MEDICARE

## 2020-07-30 NOTE — Progress Notes
Prior authorization submitted to [facility] for Calcitriol 0.25MCG capsules    [id number]  PA Case ID: [id number]

## 2020-08-03 ENCOUNTER — Ambulatory Visit: Admit: 2020-08-03 | Discharge: 2020-08-03 | Payer: MEDICARE

## 2020-08-03 ENCOUNTER — Encounter: Admit: 2020-08-03 | Discharge: 2020-08-03 | Payer: MEDICARE

## 2020-08-03 DIAGNOSIS — R897 Abnormal histological findings in specimens from other organs, systems and tissues: Secondary | ICD-10-CM

## 2020-08-03 DIAGNOSIS — I2510 Atherosclerotic heart disease of native coronary artery without angina pectoris: Secondary | ICD-10-CM

## 2020-08-03 DIAGNOSIS — D472 Monoclonal gammopathy: Secondary | ICD-10-CM

## 2020-08-03 DIAGNOSIS — Z940 Kidney transplant status: Secondary | ICD-10-CM

## 2020-08-03 DIAGNOSIS — I10 Essential (primary) hypertension: Secondary | ICD-10-CM

## 2020-08-03 DIAGNOSIS — Z79899 Other long term (current) drug therapy: Secondary | ICD-10-CM

## 2020-08-03 DIAGNOSIS — E1100 Type 2 diabetes mellitus with hyperosmolarity without nonketotic hyperglycemic-hyperosmolar coma (NKHHC): Secondary | ICD-10-CM

## 2020-08-03 DIAGNOSIS — R809 Proteinuria, unspecified: Secondary | ICD-10-CM

## 2020-08-03 DIAGNOSIS — E785 Hyperlipidemia, unspecified: Secondary | ICD-10-CM

## 2020-08-03 DIAGNOSIS — N19 Unspecified kidney failure: Secondary | ICD-10-CM

## 2020-08-03 DIAGNOSIS — N186 End stage renal disease: Secondary | ICD-10-CM

## 2020-08-03 DIAGNOSIS — D649 Anemia, unspecified: Secondary | ICD-10-CM

## 2020-08-03 DIAGNOSIS — E119 Type 2 diabetes mellitus without complications: Secondary | ICD-10-CM

## 2020-08-03 DIAGNOSIS — D849 Immunodeficiency, unspecified: Secondary | ICD-10-CM

## 2020-08-03 LAB — CBC AND DIFF
Lab: 0 K/UL (ref 0–0.20)
Lab: 0 K/UL (ref 0–0.45)
Lab: 0.3 K/UL — ABNORMAL LOW (ref 1.0–4.8)
Lab: 0.6 K/UL (ref 0–0.80)
Lab: 1 % (ref 0–2)
Lab: 10 % (ref 4–12)
Lab: 10 g/dL — ABNORMAL LOW (ref 12.0–15.0)
Lab: 2 % — ABNORMAL LOW (ref >60)
Lab: 239 K/UL (ref 150–400)
Lab: 31 % — ABNORMAL LOW (ref 36–45)
Lab: 33 g/dL (ref 32.0–36.0)
Lab: 4.9 K/UL (ref 1.8–7.0)
Lab: 6 % — ABNORMAL LOW (ref 24–44)
Lab: 6.1 K/UL (ref 4.5–11.0)
Lab: 8.6 FL (ref 7–11)
Lab: 85 FL — ABNORMAL LOW (ref 80–100)

## 2020-08-03 LAB — MAGNESIUM: Lab: 1.3 mg/dL — ABNORMAL LOW (ref 1.6–2.6)

## 2020-08-03 LAB — PROTEIN/CR RATIO,UR RAN
Lab: 0.2
Lab: 125 mg/dL (ref 5.0–8.0)
Lab: 28 mg/dL (ref 1.005–1.030)

## 2020-08-03 LAB — COMPREHENSIVE METABOLIC PANEL
Lab: 142 MMOL/L (ref 137–147)
Lab: 162 mg/dL — ABNORMAL HIGH (ref 70–100)
Lab: 24 MMOL/L — ABNORMAL HIGH (ref 21–30)
Lab: 3.6 g/dL — ABNORMAL HIGH (ref 3.5–5.0)
Lab: 4.4 MMOL/L (ref 3.5–5.1)
Lab: 6 g/dL (ref 6.0–8.0)

## 2020-08-03 LAB — URINALYSIS MICROSCOPIC REFLEX TO CULTURE

## 2020-08-03 LAB — URINALYSIS DIPSTICK REFLEX TO CULTURE
Lab: NEGATIVE
Lab: NEGATIVE
Lab: NEGATIVE MMOL/L (ref 3.5–5.1)

## 2020-08-03 LAB — URIC ACID: Lab: 5 mg/dL (ref 2.0–7.0)

## 2020-08-03 LAB — PHOSPHORUS: Lab: 3.4 mg/dL (ref 2.0–4.5)

## 2020-08-03 NOTE — Progress Notes
Date of Service: 08/03/2020    Jacqueline Shields is a 55 y.o. female.       HPI    I had the pleasure of seeing Jacqueline Shields today for cardiology follow-up.  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  She saw Dr. Vivianne Spence for evaluation after an abnormal stress test and cardiac catheterization had been requested by the transplant team.  She underwent heart catheterization on 07/03/2020, which revealed 40 to 50% mid RCA stenosis, 80% stenosis of the RV marginal branch and 80% disease in the superior branch of a medium caliber bifurcating diagonal vessel. Guideline directed medical therapy and risk factor optimization was recommended.  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  She had labs drawn this morning and her creatinine continues to improve and was 2.42 today.  A lipid panel 07/15/2020 demonstrated LDL 50, HDL 34 and triglycerides 156.  She is taking rosuvastatin 20 mg daily.  Lipoprotein a was less than 10.  Hemoglobin A1c was 11.4% on 07/15/2020 and endocrinology followed her post transplant and recommended starting a GLP-1 agonist when okay with the kidney transplant team and her endocrinologist.    [name] denies any problems with her right radial access site after her heart catheterization.  She has had a dry, annoying cough since her kidney transplant.  Respiratory virus panel last week was negative.  A chest x-ray did not show acute abnormalities. She feels like the cough is slowly improving.  She had some swelling after her transplant, but that has improved.  She does note some exertional shortness of breath.  She denies orthopnea and PND.  She denies any chest pain or pressure.  She denies lightheadedness, dizziness, syncope, presyncope and palpitations.  She took Toprol-XL 100 mg daily prior to her kidney transplant, but it has been held post transplant.  She has instructions to resume Toprol at 50 mg/day if her systolic blood pressure is greater than 150 mmHg, but it has been lower than that when she monitors it at home.       Vitals:    08/03/20 1244   BP: (!) 140/76   BP Source: Arm, Left Upper   Patient Position: Sitting   Pulse: 96   SpO2: 100%   Weight: 112 kg (247 lb)   Height: 1.727 m (5' 8)   PainSc: Zero     Body mass index is 37.56 kg/m?Marland Kitchen     Past Medical History  Patient Active Problem List    Diagnosis Date Noted   ? Dyslipidemia 08/03/2020   ? ATN (acute tubular necrosis) (HCC) 07/30/2020   ? Obesity 07/15/2020   ? Peritoneal dialysis status (HCC) 07/15/2020   ? 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 Immunosuppression (HCC) 07/15/2020   ? 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 PVD (peripheral vascular disease) (HCC) 06/15/2020   ? 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 Hypertension    ? ESRD (end stage renal disease) (HCC)    ? Diabetes mellitus (HCC)      Review of Systems   Constitutional: Negative.   HENT: Negative.    Eyes: Negative.    Cardiovascular: Negative.    Respiratory: Negative.  Endocrine: Positive for polyphagia.   Hematologic/Lymphatic: Negative.    Skin: Negative.    Musculoskeletal: Negative.    Gastrointestinal: Negative.    Genitourinary: Negative.    Neurological: Negative.    Psychiatric/Behavioral: Negative.    Allergic/Immunologic: Negative.      Physical Exam  General Appearance: no acute distress, dry cough with inspiration  Skin: warm & intact  HEENT: unremarkable  Neck Veins: neck veins are flat & not distended  Auscultation/Percussion: lungs clear to auscultation, no rales, rhonchi, or wheezing  Cardiac Rhythm: regular rhythm & normal rate  Cardiac Auscultation: Normal S1 & S2, no S3 or S4, no rub  Murmurs: no cardiac murmurs   Extremities: no lower extremity edema; 2+ symmetric distal pulses. 2+ right radial pulse  Abdominal Exam: soft, non-tender, bowel sounds normal  Neurologic Exam: oriented to time, place and person; no focal neurologic deficits  Psychiatric: Normal mood and affect.  Behavior is normal. Judgment and thought content normal.     Cardiovascular Studies  ECG was not repeated today.     Cardiovascular Health Factors  Vitals BP Readings from Last 3 Encounters:   08/03/20 (!) 140/76   07/29/20 119/71   07/22/20 130/75     Wt Readings from Last 3 Encounters:   08/03/20 112 kg (247 lb)   07/29/20 111.5 kg (245 lb 12.8 oz)   07/22/20 117.2 kg (258 lb 6.4 oz)     BMI Readings from Last 3 Encounters:   08/03/20 37.56 kg/m?   07/29/20 37.37 kg/m?   07/22/20 39.29 kg/m?      Smoking Social History     Tobacco Use   Smoking Status Never Smoker   Smokeless Tobacco Never Used      Lipid Profile Cholesterol   Date Value Ref Range Status   07/15/2020 127 <200 MG/DL Final     HDL   Date Value Ref Range Status   07/15/2020 34 (L) >40 MG/DL Final     LDL   Date Value Ref Range Status   07/15/2020 50 <100 mg/dL Final     Triglycerides   Date Value Ref Range Status   07/15/2020 156 (H) <150 MG/DL Final      Blood Sugar Hemoglobin A1C   Date Value Ref Range Status   07/15/2020 11.4 (H) 4.0 - 6.0 % Final     Comment:     The ADA recommends that most patients with type 1 and type 2 diabetes maintain   an A1c level <7%.       Glucose   Date Value Ref Range Status   08/03/2020 162 (H) 70 - 100 MG/DL Final   16/05/9603 540 (H) 70 - 100 MG/DL Final   98/06/9146 829 (H) 70 - 100 MG/DL Final     Glucose, POC   Date Value Ref Range Status   07/18/2020 171 (H) 70 - 100 MG/DL Final   56/21/3086 578 (H) 70 - 100 MG/DL Final   46/96/2952 841 (H) 70 - 100 MG/DL Final        Problems Addressed Today  Encounter Diagnoses   Name Primary?   ? Coronary artery disease due to lipid rich plaque Yes ? Primary hypertension    ? Type 2 diabetes mellitus with hyperosmolarity without coma, without long-term current use of insulin (HCC)    ? Kidney transplanted    ? Dyslipidemia      Assessment and Plan    In conclusion, Ms. Tortora underwent heart catheterization last month after mildly abnormal  stress test, which revealed 40-50% mid RCA stenosis, 80% stenosis of the RV marginal branch and 80% disease in the superior branch of a medium caliber bifurcating diagonal vessel. Guideline directed medical therapy and risk factor optimization was recommended.  LDL was at goal on a lipid panel last month.  She will continue rosuvastatin 20 mg daily. She will continue to work with her endocrinologist on management of her diabetes.  She took Toprol-XL 100 mg daily prior to her transplant, which has been held.  She has instructions to resume Toprol at 50 mg daily for systolic blood pressure rises above 150 mmHg.  She previously took aspirin 81 mg daily and will resume this when OK with the kidney transplant team.  She has normal left ventricular function.  Echocardiogram in October showed moderate tricuspid valve regurgitation, which we will continue to monitor. I requested a 56-month follow-up visit with Dr. Vivianne Spence.  I encouraged her to contact our office in the interim with any questions or concerns.         Current Medications (including today's revisions)  ? acetaminophen (TYLENOL) 325 mg tablet Take one tablet to two tablets by mouth every 6 hours as needed.   ? acyclovir (ZOVIRAX) 400 mg tablet Take one tablet by mouth every 12 hours. Indications: infection prophylaxis, medical   ? calcitrioL (ROCALTROL) 0.25 mcg capsule Take one capsule by mouth daily.   ? cholecalciferol (VITAMIN D-3) 50 mcg (2,000 unit) tablet Take one tablet by mouth daily.   ? docusate (COLACE) 100 mg capsule Take one capsule by mouth twice daily.   ? famotidine (PEPCID) 20 mg tablet Take one tablet by mouth daily. Indications: stress ulcer prevention   ? ferrous sulfate (FEOSOL) 325 mg (65 mg iron) tablet Take one tablet by mouth at bedtime daily. Take on an empty stomach at least 1 hour before or 2 hours after food.   ? insulin aspart (U-100) (NOVOLOG FLEXPEN U-100 INSULIN) 100 unit/mL (3 mL) PEN Follow Novolog mealtime dose taper to match prednisone taper.  Once off of prednisone, continue 5 units with meals and add 0-14 units per correction scale.  Max 84 units/day   ? insulin glargine (LANTUS SOLOSTAR U-100 INSULIN) 100 unit/mL (3 mL) subcutaneous PEN Inject 65 Units under the skin twice daily.   ? insulin pen needles (disposable) (BD UF NANO PEN NEEDLES) 32 gauge x 5/32 pen needle Use one each as directed as Needed. Use with insulin injections.   ? mycophenolate DR (MYFORTIC) 180 mg TbEC tablet Take four tablets by mouth twice daily.   ? oxyCODONE (ROXICODONE) 5 mg tablet Take one tablet to two tablets by mouth every 4 hours as needed   ? polyethylene glycol 3350 (MIRALAX) 17 gram/dose powder Take seventeen g by mouth daily.   ? predniSONE (DELTASONE) 5 mg tablet Take 6 tablets (30 mg) by mouth on 12/19, take 4 tablets (20 mg) once on 12/20, then take 3 tablets (15 mg) once on 12/21, then take 2 tablets ( 10 mg) once on 12/22, then take 1 tablet (5 mg) once on 12/23  Indications: prevent kidney transplant rejection   ? rosuvastatin (CRESTOR) 20 mg tablet Take one tablet by mouth daily.   ? tacrolimus (PROGRAF) 1 mg capsule Take seven capsules by mouth twice daily.   ? trimethoprim/sulfamethoxazole (BACTRIM) 80/400 mg tablet Take one tablet by mouth daily. Indications: infection prophylaxis, medical

## 2020-08-04 ENCOUNTER — Encounter: Admit: 2020-08-04 | Discharge: 2020-08-04 | Payer: MEDICARE

## 2020-08-04 MED ORDER — TACROLIMUS 1 MG PO CAP
5 mg | ORAL_CAPSULE | Freq: Two times a day (BID) | ORAL | 11 refills | Status: CN
Start: 2020-08-04 — End: ?

## 2020-08-04 NOTE — Telephone Encounter
Reviewed pt's labs from 08/03/20 with Dr. Earney Hamburg. Creaitnine stable at 2.42. FK supratherapeutic at 14.1. Will decrease Prograf from 7/7 to 5/5. Called pt with medication dose change, no answer but LVM. Also sent pt instructions via MyChart. Pt will repeat labs tomorrow, 08/05/20.

## 2020-08-04 NOTE — Telephone Encounter
-----   Message from Fabio Neighbors, MD sent at 08/04/2020  9:05 AM CST -----  5/5

## 2020-08-05 ENCOUNTER — Ambulatory Visit: Admit: 2020-08-05 | Discharge: 2020-08-05 | Payer: MEDICARE

## 2020-08-05 ENCOUNTER — Encounter: Admit: 2020-08-05 | Discharge: 2020-08-05 | Payer: MEDICARE

## 2020-08-05 DIAGNOSIS — D849 Immunodeficiency, unspecified: Secondary | ICD-10-CM

## 2020-08-05 DIAGNOSIS — D472 Monoclonal gammopathy: Secondary | ICD-10-CM

## 2020-08-05 DIAGNOSIS — D649 Anemia, unspecified: Secondary | ICD-10-CM

## 2020-08-05 DIAGNOSIS — R809 Proteinuria, unspecified: Secondary | ICD-10-CM

## 2020-08-05 DIAGNOSIS — I10 Essential (primary) hypertension: Secondary | ICD-10-CM

## 2020-08-05 DIAGNOSIS — N186 End stage renal disease: Secondary | ICD-10-CM

## 2020-08-05 DIAGNOSIS — N19 Unspecified kidney failure: Secondary | ICD-10-CM

## 2020-08-05 DIAGNOSIS — E119 Type 2 diabetes mellitus without complications: Secondary | ICD-10-CM

## 2020-08-05 DIAGNOSIS — Z940 Kidney transplant status: Secondary | ICD-10-CM

## 2020-08-05 DIAGNOSIS — Z79899 Other long term (current) drug therapy: Secondary | ICD-10-CM

## 2020-08-05 DIAGNOSIS — R897 Abnormal histological findings in specimens from other organs, systems and tissues: Secondary | ICD-10-CM

## 2020-08-05 DIAGNOSIS — I2510 Atherosclerotic heart disease of native coronary artery without angina pectoris: Secondary | ICD-10-CM

## 2020-08-05 LAB — URINALYSIS DIPSTICK REFLEX TO CULTURE
Lab: NEGATIVE
Lab: NEGATIVE

## 2020-08-05 LAB — URINALYSIS MICROSCOPIC REFLEX TO CULTURE

## 2020-08-05 LAB — PROTEIN/CR RATIO,UR RAN
Lab: 14 mg/dL
Lab: 75 mg/dL

## 2020-08-05 LAB — PHOSPHORUS: Lab: 3.9 mg/dL (ref 2.0–4.5)

## 2020-08-05 LAB — COMPREHENSIVE METABOLIC PANEL
Lab: 123 mg/dL — ABNORMAL HIGH (ref 70–100)
Lab: 141 MMOL/L — ABNORMAL LOW (ref 137–147)
Lab: 2.6 mg/dL — ABNORMAL HIGH (ref 0.4–1.00)
Lab: 4.3 MMOL/L — ABNORMAL LOW (ref 3.5–5.1)
Lab: 6.2 g/dL (ref 6.0–8.0)

## 2020-08-05 LAB — BK VIRUS DNA, QUANT PLASMA

## 2020-08-05 LAB — CBC AND DIFF
Lab: 3.5 M/UL — ABNORMAL LOW (ref 4.0–5.0)
Lab: 5.8 K/UL (ref 4.5–11.0)

## 2020-08-05 LAB — MAGNESIUM: Lab: 1.3 mg/dL — ABNORMAL LOW (ref 1.6–2.6)

## 2020-08-05 LAB — URIC ACID: Lab: 5.3 mg/dL (ref 2.0–7.0)

## 2020-08-05 MED ORDER — ASPIRIN 81 MG PO TBEC
81 mg | ORAL_TABLET | Freq: Every day | ORAL | 3 refills | Status: CN
Start: 2020-08-05 — End: ?

## 2020-08-09 ENCOUNTER — Encounter: Admit: 2020-08-09 | Discharge: 2020-08-09 | Payer: MEDICARE

## 2020-08-09 DIAGNOSIS — R897 Abnormal histological findings in specimens from other organs, systems and tissues: Secondary | ICD-10-CM

## 2020-08-09 DIAGNOSIS — R809 Proteinuria, unspecified: Secondary | ICD-10-CM

## 2020-08-09 DIAGNOSIS — D472 Monoclonal gammopathy: Secondary | ICD-10-CM

## 2020-08-09 DIAGNOSIS — N19 Unspecified kidney failure: Secondary | ICD-10-CM

## 2020-08-09 DIAGNOSIS — N186 End stage renal disease: Secondary | ICD-10-CM

## 2020-08-09 DIAGNOSIS — I2510 Atherosclerotic heart disease of native coronary artery without angina pectoris: Secondary | ICD-10-CM

## 2020-08-09 DIAGNOSIS — I10 Essential (primary) hypertension: Secondary | ICD-10-CM

## 2020-08-09 DIAGNOSIS — D649 Anemia, unspecified: Secondary | ICD-10-CM

## 2020-08-09 DIAGNOSIS — E119 Type 2 diabetes mellitus without complications: Secondary | ICD-10-CM

## 2020-08-12 ENCOUNTER — Encounter: Admit: 2020-08-12 | Discharge: 2020-08-12 | Payer: MEDICARE

## 2020-08-12 ENCOUNTER — Ambulatory Visit: Admit: 2020-08-12 | Discharge: 2020-08-12 | Payer: MEDICARE

## 2020-08-12 MED FILL — SULFAMETHOXAZOLE-TRIMETHOPRIM 400-80 MG PO TAB: 80/400 mg | ORAL | 30 days supply | Qty: 30 | Fill #2 | Status: CP

## 2020-08-12 MED FILL — ACYCLOVIR 400 MG PO TAB: 400 mg | ORAL | 30 days supply | Qty: 60 | Fill #2 | Status: CP

## 2020-08-12 MED FILL — FAMOTIDINE 20 MG PO TAB: 20 mg | ORAL | 30 days supply | Qty: 30 | Fill #2 | Status: CP

## 2020-08-12 MED FILL — MYCOPHENOLATE SODIUM 180 MG PO TBEC: 180 mg | ORAL | 30 days supply | Qty: 240 | Fill #2 | Status: CP

## 2020-08-12 NOTE — Progress Notes
Requested date from Dr Arie Sabina for removal of PD catheter and ureteral stent.

## 2020-08-12 NOTE — Patient Instructions
Appt with Hebert Soho, nurse practitioner, in one week    Labs Wednesdays and Saturdays at the [facility]

## 2020-08-13 ENCOUNTER — Encounter: Admit: 2020-08-13 | Discharge: 2020-08-13 | Payer: MEDICARE

## 2020-08-13 DIAGNOSIS — D849 Immunodeficiency, unspecified: Secondary | ICD-10-CM

## 2020-08-13 DIAGNOSIS — Z940 Kidney transplant status: Secondary | ICD-10-CM

## 2020-08-14 ENCOUNTER — Ambulatory Visit: Admit: 2020-08-14 | Discharge: 2020-08-14 | Payer: MEDICARE

## 2020-08-14 ENCOUNTER — Encounter: Admit: 2020-08-14 | Discharge: 2020-08-14 | Payer: MEDICARE

## 2020-08-14 DIAGNOSIS — D849 Immunodeficiency, unspecified: Secondary | ICD-10-CM

## 2020-08-14 DIAGNOSIS — Z940 Kidney transplant status: Secondary | ICD-10-CM

## 2020-08-14 DIAGNOSIS — Z79899 Other long term (current) drug therapy: Secondary | ICD-10-CM

## 2020-08-14 LAB — COMPREHENSIVE METABOLIC PANEL
Lab: 0.4 mg/dL — ABNORMAL HIGH (ref 0.3–1.2)
Lab: 10 K/UL — ABNORMAL LOW (ref 3–12)
Lab: 142 MMOL/L — ABNORMAL LOW (ref 137–147)
Lab: 148 mg/dL — ABNORMAL HIGH (ref 70–100)
Lab: 15 mg/dL (ref 7–25)
Lab: 16 U/L (ref 7–40)
Lab: 19 U/L (ref 7–56)
Lab: 23 MMOL/L (ref 21–30)
Lab: 3.8 g/dL — ABNORMAL LOW (ref 3.5–5.0)
Lab: 33 mL/min — ABNORMAL LOW (ref >60)
Lab: 4.3 MMOL/L — ABNORMAL LOW (ref 3.5–5.1)
Lab: 8.3 mg/dL — ABNORMAL LOW (ref 8.5–10.6)

## 2020-08-14 LAB — URINALYSIS MICROSCOPIC REFLEX TO CULTURE

## 2020-08-14 LAB — URINALYSIS DIPSTICK REFLEX TO CULTURE
Lab: NEGATIVE
Lab: NEGATIVE
Lab: NEGATIVE
Lab: NEGATIVE
Lab: NEGATIVE
Lab: NEGATIVE

## 2020-08-14 LAB — CBC AND DIFF
Lab: 0 K/UL (ref 0–0.20)
Lab: 0.1 K/UL (ref 0–0.45)
Lab: 3.7 M/UL — ABNORMAL LOW (ref 4.0–5.0)
Lab: 6.3 K/UL (ref 4.5–11.0)

## 2020-08-14 LAB — MAGNESIUM: Lab: 1.3 mg/dL — ABNORMAL LOW (ref 1.6–2.6)

## 2020-08-14 LAB — URIC ACID: Lab: 4.6 mg/dL (ref 2.0–7.0)

## 2020-08-14 LAB — PROTEIN/CR RATIO,UR RAN
Lab: 19 mg/dL
Lab: 79 mg/dL

## 2020-08-14 LAB — PHOSPHORUS: Lab: 3.4 mg/dL — ABNORMAL HIGH (ref 2.0–4.5)

## 2020-08-19 ENCOUNTER — Encounter: Admit: 2020-08-19 | Discharge: 2020-08-19 | Payer: MEDICARE

## 2020-08-19 ENCOUNTER — Ambulatory Visit: Admit: 2020-08-19 | Discharge: 2020-08-19 | Payer: MEDICARE

## 2020-08-19 DIAGNOSIS — D649 Anemia, unspecified: Secondary | ICD-10-CM

## 2020-08-19 DIAGNOSIS — D849 Immunodeficiency, unspecified: Secondary | ICD-10-CM

## 2020-08-19 DIAGNOSIS — R897 Abnormal histological findings in specimens from other organs, systems and tissues: Secondary | ICD-10-CM

## 2020-08-19 DIAGNOSIS — N186 End stage renal disease: Secondary | ICD-10-CM

## 2020-08-19 DIAGNOSIS — N19 Unspecified kidney failure: Secondary | ICD-10-CM

## 2020-08-19 DIAGNOSIS — Z940 Kidney transplant status: Secondary | ICD-10-CM

## 2020-08-19 DIAGNOSIS — Z79899 Other long term (current) drug therapy: Secondary | ICD-10-CM

## 2020-08-19 DIAGNOSIS — I2510 Atherosclerotic heart disease of native coronary artery without angina pectoris: Secondary | ICD-10-CM

## 2020-08-19 DIAGNOSIS — D472 Monoclonal gammopathy: Secondary | ICD-10-CM

## 2020-08-19 DIAGNOSIS — R809 Proteinuria, unspecified: Secondary | ICD-10-CM

## 2020-08-19 DIAGNOSIS — E0821 Diabetes mellitus due to underlying condition with diabetic nephropathy: Secondary | ICD-10-CM

## 2020-08-19 DIAGNOSIS — I10 Essential (primary) hypertension: Secondary | ICD-10-CM

## 2020-08-19 DIAGNOSIS — E119 Type 2 diabetes mellitus without complications: Secondary | ICD-10-CM

## 2020-08-19 LAB — URINALYSIS MICROSCOPIC REFLEX TO CULTURE

## 2020-08-19 LAB — COMPREHENSIVE METABOLIC PANEL
Lab: 1.9 mg/dL — ABNORMAL HIGH (ref 0.4–1.00)
Lab: 10 (ref 3–12)
Lab: 142 MMOL/L (ref 137–147)
Lab: 169 mg/dL — ABNORMAL HIGH (ref 70–100)
Lab: 22 U/L (ref 7–40)
Lab: 25 MMOL/L (ref 21–30)
Lab: 25 U/L (ref 7–56)
Lab: 31 mL/min — ABNORMAL LOW (ref >60)
Lab: 4.7 MMOL/L (ref 3.5–5.1)
Lab: 6.3 g/dL (ref 6.0–8.0)
Lab: 66 U/L (ref 25–110)

## 2020-08-19 LAB — CBC AND DIFF
Lab: 0 K/UL (ref 0–0.20)
Lab: 0.1 K/UL (ref 0–0.45)
Lab: 0.5 K/UL — ABNORMAL LOW (ref 1.0–4.8)
Lab: 0.6 K/UL (ref 0–0.80)
Lab: 1 % (ref 0–2)
Lab: 1 % (ref 0–5)
Lab: 10 % (ref 4–12)
Lab: 10 g/dL — ABNORMAL LOW (ref 12.0–15.0)
Lab: 16 % — ABNORMAL HIGH (ref 11–15)
Lab: 231 K/UL (ref 150–400)
Lab: 29 pg (ref 26–34)
Lab: 3.7 M/UL — ABNORMAL LOW (ref 4.0–5.0)
Lab: 32 % — ABNORMAL LOW (ref 36–45)
Lab: 33 g/dL (ref 32.0–36.0)
Lab: 5.3 K/UL (ref 1.8–7.0)
Lab: 6.7 K/UL (ref 4.5–11.0)
Lab: 8 % — ABNORMAL LOW (ref 24–44)
Lab: 80 % — ABNORMAL HIGH (ref 41–77)
Lab: 87 FL (ref 80–100)
Lab: 9.2 FL (ref 7–11)

## 2020-08-19 LAB — URINALYSIS DIPSTICK REFLEX TO CULTURE
Lab: NEGATIVE
Lab: NEGATIVE
Lab: NEGATIVE
Lab: NEGATIVE
Lab: NEGATIVE
Lab: NEGATIVE

## 2020-08-19 LAB — BK VIRUS DNA, QUANT PLASMA

## 2020-08-19 LAB — URIC ACID: Lab: 5 mg/dL (ref 2.0–7.0)

## 2020-08-19 LAB — PHOSPHORUS: Lab: 3.9 mg/dL (ref 2.0–4.5)

## 2020-08-19 LAB — MAGNESIUM: Lab: 1.3 mg/dL — ABNORMAL LOW (ref 1.6–2.6)

## 2020-08-19 LAB — PROTEIN/CR RATIO,UR RAN
Lab: 15 mg/dL
Lab: 66 mg/dL

## 2020-08-19 MED ORDER — TIXAGEVIMAB-CILGAVIMAB 150 MG/1.5 ML- 150 MG/1.5 ML IM SOLN
300 mg | Freq: Once | INTRAMUSCULAR | 0 refills | Status: CP
Start: 2020-08-19 — End: ?
  Administered 2020-08-19: 17:00:00 300 mg via INTRAMUSCULAR

## 2020-08-19 NOTE — Progress Notes
The Monoclonal Antibody Therapy Infusion was discussed with the patient, and the patient verbally consented to receive services:       The patient meets criteria to receive the medication   The patient understands that sotrovimab or Tixagevimab/Cilgavimab may be administered   The patient understands that this therapy has the potential to decrease their risk of hospitalization due to COVID-19   The patient understands that this therapy is being utilized under Emergency Authorization Use (EAU) and is considered investigational   The patient understands that there is a risk of anaphylaxis associated with the infusion   The patient understands any other risks associated with the infusion   The patient understands that if they are a female patient and not post-menopausal they will be required to take a point of care urine pregnancy test prior to the initiation of the infusion   The patient understands they are not to receive the COVID-19 vaccine until 90 days post transfusion.

## 2020-08-19 NOTE — Progress Notes
Last seen: 08/05/20    12/18: Hospital d/c, Calcium gluconate IV for Ca+ 5.9    2022  1/12: Staples removed    1/19 Plan: Will do evusheld today.     Transplant Synoposis   Date: 07/15/20  ESRD 2/2 DM2. 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 ON PD Jan 2019  DDRT  KPDI 92%, cPRA 0%  Physical Crossmatch B cell negative T cell negative  CMV D -/ R -  Induction: Thymo  Maintenance IS: Dual  IS   PD not removed with surgery   Post-op Complications:  - None     PMH: MGUS, DM, HTN, HLD

## 2020-08-19 NOTE — Progress Notes
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 On PD Jan 2019  DDRT  KPDI 92%, cPRA 0%  Physical Crossmatch B cell negative T cell negative  CMV D -/ R -  Induction: Thymo  Maintenance IS: Dual  IS   PD not removed with surgery   Post-op Complications:  - None    Referring Nephrologist:  Elmer Bales  [address]  [name]   [place] [address]  Phone: [phone]  Fax: [phone]     Dear Dr. Elmer Bales,    We had the pleasure of meeting Jacqueline Shields in the [facility] for routine evaluation of her renal transplant. She was last seen 08/05/2020 and has been doing well since that time.     She has not been measuring her home BPs due to lack of motivation.     Home glucose readings are 86-120s. She remains on Novolog and Lantus.    She still has a cough which has subsided but is exacerbated when talking or overexertion. She denies DOE, PND. Her COVID and RVP were -ve on previous clinic visit. This cough has been present since transplant. She has not taken any medications or interventions to provide relief.      She reports compliance with her immunosuppresion regimen.        REVIEW OF SYSTEMS: Comprehensive 14-point ROS reviewed  Positives noted in HPI otherwise negative.    Past History:  Medical History:   Diagnosis Date   ? Abnormal biopsy of kidney    ? Anemia    ? 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 Diabetes mellitus (HCC)    ? ESRD (end stage renal disease) (HCC)    ? Hypertension    ? Kidney failure    ? MGUS (monoclonal gammopathy of unknown significance)    ? Proteinuria        Surgical History:   Procedure Laterality Date   ? ANGIOGRAPHY CORONARY ARTERY WITH LEFT HEART CATHETERIZATION N/A 07/03/2020    Performed by Harley Alto, MD at [facility] CATH LAB   ? POSSIBLE PERCUTANEOUS CORONARY STENT PLACEMENT WITH ANGIOPLASTY N/A 07/03/2020    Performed by Harley Alto, MD at [facility] CATH LAB   ? 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 CATHETER IMPLANT/REVISION      PD cath   ? HX HYSTERECTOMY     ? HX LITHOTRIPSY         Social History     Socioeconomic History   ? Marital status: Married     Spouse name: Not on file   ? Number of children: Not on file   ? Years of education: Not on file   ? Highest education level: Not on file   Occupational History   ? Not on file   Tobacco Use   ? Smoking status: Never Smoker   ? Smokeless tobacco: Never Used   Vaping Use   ? Vaping Use: Never used   Substance and Sexual Activity   ? Alcohol use: Not Currently   ? Drug use: Never   ? Sexual activity: Not on file  Other Topics Concern   ? Not on file   Social History Narrative   ? Not on file       Family History   Problem Relation Age of Onset   ? Diabetes Mother    ? Hypertension Mother    ? Diabetes Father    ? Hypertension Father    ? Cancer-Hematologic Father    ? Diabetes Sister    ? Migraines Sister    ? Cancer Sister         Endometrial       No Known Allergies    Current Medications:    Current Outpatient Medications:   ?  acetaminophen (TYLENOL) 325 mg tablet, Take one tablet to two tablets by mouth every 6 hours as needed., Disp: 120 tablet, Rfl: 1  ?  acyclovir (ZOVIRAX) 400 mg tablet, Take one tablet by mouth every 12 hours. Indications: infection prophylaxis, medical, Disp: 60 tablet, Rfl: 2  ?  aspirin EC (ASPIR-LOW) 81 mg tablet, Take one tablet by mouth daily. Take with food., Disp: 90 tablet, Rfl: 3  ?  calcitrioL (ROCALTROL) 0.25 mcg capsule, Take one capsule by mouth daily., Disp: 90 capsule, Rfl: 3  ?  cholecalciferol (VITAMIN D-3) 50 mcg (2,000 unit) tablet, Take one tablet by mouth daily., Disp: 90 tablet, Rfl: 3  ?  docusate (COLACE) 100 mg capsule, Take one capsule by mouth twice daily., Disp: 180 capsule, Rfl: 0  ?  famotidine (PEPCID) 20 mg tablet, Take one tablet by mouth daily. Indications: stress ulcer prevention, Disp: 30 tablet, Rfl: 5  ?  ferrous sulfate (FEOSOL) 325 mg (65 mg iron) tablet, Take one tablet by mouth at bedtime daily. Take on an empty stomach at least 1 hour before or 2 hours after food., Disp: 30 tablet, Rfl: 3  ?  insulin aspart (U-100) (NOVOLOG FLEXPEN U-100 INSULIN) 100 unit/mL (3 mL) PEN, Follow Novolog mealtime dose taper to match prednisone taper.  Once off of prednisone, continue 5 units with meals and add 0-14 units per correction scale.  Max 84 units/day (Patient taking differently: 20 Units. Follow Novolog mealtime dose taper to match prednisone taper.  Once off of prednisone, continue 5 units with meals and add 0-14 units per correction scale.  Max 84 units/day), Disp: 45 mL, Rfl: 1  ?  insulin glargine (LANTUS SOLOSTAR U-100 INSULIN) 100 unit/mL (3 mL) subcutaneous PEN, Inject 65 Units under the skin twice daily., Disp: 45 mL, Rfl: 1  ?  insulin pen needles (disposable) (BD UF NANO PEN NEEDLES) 32 gauge x 5/32 pen needle, Use one each as directed as Needed. Use with insulin injections., Disp: 300 each, Rfl: 3  ?  mycophenolate DR (MYFORTIC) 180 mg TbEC tablet, Take four tablets by mouth twice daily., Disp: 240 tablet, Rfl: 11  ?  polyethylene glycol 3350 (MIRALAX) 17 gram/dose powder, Take seventeen g by mouth daily., Disp: 527 g, Rfl: 0  ?  rosuvastatin (CRESTOR) 20 mg tablet, Take one tablet by mouth daily., Disp: 90 tablet, Rfl: 1  ?  tacrolimus (PROGRAF) 1 mg capsule, Take five capsules by mouth twice daily., Disp: 300 capsule, Rfl: 11  ?  trimethoprim/sulfamethoxazole (BACTRIM) 80/400 mg tablet, Take one tablet by mouth daily. Indications: infection prophylaxis, medical, Disp: 30 tablet, Rfl: 5    Current Facility-Administered Medications:   ?  tixagevimab-cilgavimab (EVUSHELD (EUA)) 150 mg/1.5 mL- 150 mg/1.5 mL (+) IM injection 300 mg, 300 mg, Intramuscular, ONCE, Corena Herter, MD    Physical Exam:  Vitals:  08/19/20 1033 08/19/20 1034   BP: (!) 149/87 125/75   BP Source: Arm, Right Upper Arm, Right Upper   Patient Position: Sitting Standing   Pulse: 78 88   Temp: 36.8 ?C (98.2 ?F)    TempSrc: Oral    SpO2: 100%    Weight: 113.4 kg (250 lb)    Height: 172.7 cm (68)    PainSc: Zero    Body mass index is 38.01 kg/m?Marland Kitchen      General: NAD, A+Ox4, calm and pleasant. Appears to be stated age.   HENT: Unremarkable, no oral lesions, wearing mask  Neck: Normal ROM, no LAD, no JVD  Lungs: Bilat. CTA  CV: RRR, S1, S2 without carotid bruit or murmur, no edema  Abdomen: Soft, N/D, N/T without hepatosplenomegaly, PD cath C/D/I  Incision: Helaing  M/S: Normal ROM and strength  Neuro: Nonfocal deficits without tremors  Skin: No skin rash  Psyc: Stable      Laboratory studies:     CMP:  CMP Latest Ref Rng & Units 08/14/2020 08/12/2020 08/05/2020 08/03/2020 07/29/2020   NA 137 - 147 MMOL/L 142 145 141 142 142   K 3.5 - 5.1 MMOL/L 4.3 5.1 4.3 4.4 3.5   CL 98 - 110 MMOL/L 109 110 108 108 106   CO2 21 - 30 MMOL/L 23 20(L) 24 24 22    GAP 3 - 12 10 15(H) 9 10 14(H)   BUN 7 - 25 MG/DL 15 14 23 20  29(H)   CR 0.4 - 1.00 MG/DL 1.61(W) 9.60(A) 5.40(J) 2.42(H) 2.65(H)   GLUX 70 - 100 MG/DL 811(B) 85 147(W) 295(A) 183(H)   CA 8.5 - 10.6 MG/DL 8.3(L) 8.8 8.6 8.4(L) 7.3(L)   TP 6.0 - 8.0 G/DL 6.1 6.4 6.2 6.0 6.1   ALB 3.5 - 5.0 G/DL 3.8 3.9 3.7 3.6 3.6   ALKP 25 - 110 U/L 71 68 64 65 70   ALT 7 - 56 U/L 19 19 16 15 15    TBILI 0.3 - 1.2 MG/DL 0.4 0.4 0.4 0.4 0.4   GFR >60 mL/min - - - - -   GFRAA >60 mL/min - - - - -     Hemoglobin A1C (%)   Date Value   07/15/2020 11.4 (H)   05/05/2020 9.5 (H)   04/30/2019 10.7 (H)     PTH Hormone (PG/ML)   Date Value   07/15/2020 272.3 (H)     No results found for: LIPASE  No results found for: AMY  BK Virus Plasma Quant (no units)   Date Value   08/05/2020 BK Virus Not Detected   07/20/2020 BK Virus Not Detected     No results found for: CMVDNAPCR  No results found for: IUMLBLD  No results found for: COPIES  No results found for: EBVDNAQT    TACROLIMUS LEVEL:  No results found for: TACROLIMUS    CBC with Diff:  CBC with Diff Latest Ref Rng & Units 08/14/2020 08/05/2020   WBC 4.5 - 11.0 K/UL 6.3 5.8   RBC 4.0 - 5.0 M/UL 3.70(L) 3.59(L)   HGB 12.0 - 15.0 GM/DL 10.7(L) 10.3(L)   HCT 36 - 45 % 32.3(L) 30.9(L)   MCV 80 - 100 FL 87.3 86.1   MCH 26 - 34 PG 29.0 28.7   MCHC 32.0 - 36.0 G/DL 21.3 08.6   RDW 11 - 15 % 16.5(H) 15.8(H)   PLT 150 - 400 K/UL 240 218   MPV 7 - 11 FL 8.6 8.7   NEUT  41 - 77 % 80(H) 83(H)   ANC 1.8 - 7.0 K/UL 5.06 4.83   LYMA 24 - 44 % 6(L) 7(L)   ALYM 1.0 - 4.8 K/UL 0.37(L) 0.40(L)   MONA 4 - 12 % 10 8   AMONO 0 - 0.80 K/UL 0.66 0.49   EOSA 0 - 5 % 3 1   AEOS 0 - 0.45 K/UL 0.18 0.07   BASA 0 - 2 % 1 1   ABAS 0 - 0.20 K/UL 0.06 0.05     Lab Results   Component Value Date/Time    IRON 54 07/17/2020 06:06 AM    TIBC 224 (L) 07/17/2020 06:06 AM    PSAT 24 (L) 07/17/2020 06:06 AM    FERRITIN 719 (H) 07/17/2020 06:06 AM       Urinalysis:  Lab Results   Component Value Date/Time    UCOLOR YELLOW 08/14/2020 09:28 AM    TURBID CLEAR 08/14/2020 09:28 AM    USPGR 1.011 08/14/2020 09:28 AM    UPH 6.0 08/14/2020 09:28 AM    UPROTEIN NEG 08/14/2020 09:28 AM    UAGLU 1+ (A) 08/14/2020 09:28 AM    UKET NEG 08/14/2020 09:28 AM    UBILE NEG 08/14/2020 09:28 AM    UBLD NEG 08/14/2020 09:28 AM    UROB NORMAL 08/14/2020 09:28 AM     Protein/CR ratio (no units)   Date Value   08/14/2020 0.2   08/05/2020 0.2   08/03/2020 0.2   07/29/2020 0.5   07/22/2020 0.3       Imaging:    Results for orders placed during the hospital encounter of 07/29/20    CHEST 2 VIEWS    Impression  No evidence of acute cardiopulmonary disease.      Finalized by Arlana Hove, M.D. on 07/29/2020 12:15 PM. Dictated by Arlana Hove, M.D. on 07/29/2020 12:14 PM.      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 S/p DDRT on 07/15/20. cPRA 0%, KDPI 92%   ?  #) Immunosuppression - On maintenance dual drug therapy  - Goal tacrolimus trough first 3 months after transplant is 8-12 mcg/L (HPLC/LCMS). Tac level pending  - Tacrolimus 5mg  bid. MPA 720 mg BID and steroid minimization protocol due to poorly controled DM and 0 PRA    - prednisone stopped at discharge. .  ?  #) Deceased donor renal transplant: [facility]?/ATN  - bl Cr TBD. Cr on admission: 2.8  - Transplant Ultrasound 07/15/2020: Unremarkable   - Monitor for now   - Ureteral stent/PD cath schedule date pending  - would add ACEI for renal protection when Creatinine stable  - Pt received Evusheld for preventative COVID-19 measures in the immunocompromised patient    #) ID Proph  - CMV Donor(-)/ Recipient(-) to treat with Acyclovir for 3 months post transplant  - PJP Prophylax: To treat with Bactrim/Septra for 6 months post transplant  - Nystatin swish for 2 weeks post transplant      ?#) Dry cough   - slightly improved.  - advised to try conventional measures and report s/s of exacerbation   ?    #) BP - fair control   - Monitor for now   - Encouraged pt to resume home monitoring  ?  #) Type II DM - improving control   - Endocrine consulted while inpatient  - Hemoglobin A 1C prior to transplant - 11.4% (07/15/20). Check in one month  - Will benefit from  SGLT2 inhibitor outpatient   - Scientist, physiological and Novolog  ?  #) Anemia - Hgb average of 10-11  - Low iron stores on iron supplements  ?  # CKD BMD/Hypocalcemia    - PTH 273, Vit D 28  - Vit D 2000 units daily  - Cont Calcitriol 0.25 mg daily     #) Health maintenance by PCP   Skin cancer surveillance: Annual follow up with Dermatology for skin eval due to long term immunosuppression   Cancer screening: Mammogram, Pap Smear, colonoscopy per guidelines   Annual high dose flu vaccine    Avoid all live vaccines. When clinically indicated or age appropriate, can receive the Shingrix vaccine one year post transplant.      Fully vaccinated for COVID-19 [Moderna] x 2 doses.        RTC in 1 week  Labs Twice a week  Remove remaining staples on RTC  F/u on Ureteral stent/PD cath schedule date    Sincerely,    Alessandra Bevels, APRN-NP  Kidney and Pancreas Transplant    Cc: Elmer Bales  Cc: Leron Croak    Please contact the [facility] at [phone] for any transplant related questions or concerns that may arise.      Total time minutes: 40  Reviewed data prior to visit: 10 minutes  Face to face time spent with patient:  20 minutes   After visit time spent on documentation and coordination of care: 10 minutes   Documentation review includes review of past medical history, Nephrology notes, interpretation of laboratory results  Face to face time included counseling and education patient and family/caregiver, examination, communicated laboratory results, care coordination

## 2020-08-20 ENCOUNTER — Encounter: Admit: 2020-08-20 | Discharge: 2020-08-20 | Payer: MEDICARE

## 2020-08-20 DIAGNOSIS — Z20822 Encounter for screening laboratory testing for COVID-19 virus in asymptomatic patient: Secondary | ICD-10-CM

## 2020-08-20 DIAGNOSIS — Z940 Kidney transplant status: Secondary | ICD-10-CM

## 2020-08-20 DIAGNOSIS — Z992 Dependence on renal dialysis: Secondary | ICD-10-CM

## 2020-08-21 ENCOUNTER — Encounter: Admit: 2020-08-21 | Discharge: 2020-08-21 | Payer: MEDICARE

## 2020-08-21 ENCOUNTER — Ambulatory Visit: Admit: 2020-08-21 | Discharge: 2020-08-21 | Payer: MEDICARE

## 2020-08-21 DIAGNOSIS — Z20822 Encounter for screening laboratory testing for COVID-19 virus in asymptomatic patient: Secondary | ICD-10-CM

## 2020-08-21 DIAGNOSIS — Z940 Kidney transplant status: Secondary | ICD-10-CM

## 2020-08-21 DIAGNOSIS — Z992 Dependence on renal dialysis: Secondary | ICD-10-CM

## 2020-08-21 LAB — COVID-19 (SARS-COV-2) PCR

## 2020-08-21 MED FILL — TACROLIMUS 1 MG PO CAP: 1 mg | ORAL | 30 days supply | Qty: 300 | Fill #1 | Status: CP

## 2020-08-21 NOTE — Telephone Encounter
Pt scheduled for PD cath/ureteral stent removal on Monday, 08/24/20. Pt unable to be COVID tested locally. Pt scheduled for pre procedure COVID testing in [facility] today. Pt verbalized understanding.

## 2020-08-21 NOTE — Progress Notes
Removal of PD catheter by Dr Arie Sabina and removal of transplant ureteral stent scheduled by Dr Randa Lynn scheduled for 08/24/20 at 1230.  C. Iske RN transplant coordinator requested to notify pt and obtain covid test.

## 2020-08-23 ENCOUNTER — Encounter: Admit: 2020-08-23 | Discharge: 2020-08-23 | Payer: MEDICARE

## 2020-08-24 ENCOUNTER — Encounter: Admit: 2020-08-24 | Discharge: 2020-08-24 | Payer: MEDICARE

## 2020-08-24 ENCOUNTER — Ambulatory Visit: Admit: 2020-08-24 | Discharge: 2020-08-24 | Payer: MEDICARE

## 2020-08-24 MED ORDER — GLYCOPYRROLATE 0.2 MG/ML IJ SOLN
INTRAVENOUS | 0 refills | Status: DC
Start: 2020-08-24 — End: 2020-08-24
  Administered 2020-08-24: 19:00:00 .8 mg via INTRAVENOUS

## 2020-08-24 MED ORDER — LIDOCAINE (PF) 200 MG/10 ML (2 %) IJ SYRG
INTRAVENOUS | 0 refills | Status: DC
Start: 2020-08-24 — End: 2020-08-24
  Administered 2020-08-24: 18:00:00 50 mg via INTRAVENOUS

## 2020-08-24 MED ORDER — FENTANYL CITRATE (PF) 50 MCG/ML IJ SOLN
INTRAVENOUS | 0 refills | Status: DC
Start: 2020-08-24 — End: 2020-08-24
  Administered 2020-08-24: 18:00:00 100 ug via INTRAVENOUS
  Administered 2020-08-24 (×2): 50 ug via INTRAVENOUS

## 2020-08-24 MED ORDER — CEFAZOLIN 1 GRAM IJ SOLR
INTRAVENOUS | 0 refills | Status: DC
Start: 2020-08-24 — End: 2020-08-24
  Administered 2020-08-24: 18:00:00 2 g via INTRAVENOUS

## 2020-08-24 MED ORDER — DEXAMETHASONE SODIUM PHOSPHATE 4 MG/ML IJ SOLN
INTRAVENOUS | 0 refills | Status: DC
Start: 2020-08-24 — End: 2020-08-24
  Administered 2020-08-24: 18:00:00 4 mg via INTRAVENOUS

## 2020-08-24 MED ORDER — SUCCINYLCHOLINE-SOD CL,ISO(PF) 200 MG/10 ML (20 MG/ML) IV SYRG
INTRAVENOUS | 0 refills | Status: DC
Start: 2020-08-24 — End: 2020-08-24
  Administered 2020-08-24: 18:00:00 80 mg via INTRAVENOUS

## 2020-08-24 MED ORDER — NEOSTIGMINE METHYLSULFATE 1 MG/ML IJ SOLN
INTRAVENOUS | 0 refills | Status: DC
Start: 2020-08-24 — End: 2020-08-24
  Administered 2020-08-24: 19:00:00 5 mg via INTRAVENOUS

## 2020-08-24 MED ORDER — ONDANSETRON HCL (PF) 4 MG/2 ML IJ SOLN
INTRAVENOUS | 0 refills | Status: DC
Start: 2020-08-24 — End: 2020-08-24
  Administered 2020-08-24: 18:00:00 4 mg via INTRAVENOUS

## 2020-08-24 MED ORDER — HALOPERIDOL LACTATE 5 MG/ML IJ SOLN
INTRAVENOUS | 0 refills | Status: DC
Start: 2020-08-24 — End: 2020-08-24
  Administered 2020-08-24: 18:00:00 1 mg via INTRAVENOUS

## 2020-08-24 MED ORDER — PROPOFOL 10 MG/ML IV EMUL 100 ML (INFUSION)(AM)(OR)
INTRAVENOUS | 0 refills | Status: DC
Start: 2020-08-24 — End: 2020-08-24
  Administered 2020-08-24: 18:00:00 150 ug/kg/min via INTRAVENOUS

## 2020-08-24 MED ORDER — ARTIFICIAL TEARS SINGLE DOSE DROPS GROUP
OPHTHALMIC | 0 refills | Status: DC
Start: 2020-08-24 — End: 2020-08-24
  Administered 2020-08-24: 18:00:00 2 [drp] via OPHTHALMIC

## 2020-08-24 MED ORDER — ROCURONIUM 10 MG/ML IV SOLN GROUP
INTRAVENOUS | 0 refills | Status: DC
Start: 2020-08-24 — End: 2020-08-24
  Administered 2020-08-24: 18:00:00 30 mg via INTRAVENOUS

## 2020-08-24 MED ORDER — ROCURONIUM 10 MG/ML IV SOLN
INTRAVENOUS | 0 refills | Status: DC
Start: 2020-08-24 — End: 2020-08-24

## 2020-08-24 MED ORDER — MIDAZOLAM 1 MG/ML IJ SOLN
INTRAVENOUS | 0 refills | Status: DC
Start: 2020-08-24 — End: 2020-08-24
  Administered 2020-08-24: 18:00:00 2 mg via INTRAVENOUS

## 2020-08-24 MED ORDER — PROPOFOL INJ 10 MG/ML IV VIAL
INTRAVENOUS | 0 refills | Status: DC
Start: 2020-08-24 — End: 2020-08-24
  Administered 2020-08-24: 18:00:00 200 mg via INTRAVENOUS

## 2020-08-24 MED ADMIN — SODIUM CHLORIDE 0.9 % IV SOLP [27838]: 1000 mL | INTRAVENOUS | @ 17:00:00 | Stop: 2020-08-24 | NDC 00338004904

## 2020-08-24 MED ADMIN — LIDOCAINE-EPINEPHRINE 1 %-1:100,000 IJ SOLN [15955]: 20 mL | INTRAMUSCULAR | @ 18:00:00 | Stop: 2020-08-24 | NDC 00409317817

## 2020-08-24 MED ADMIN — ACETAMINOPHEN 500 MG PO TAB [102]: 1000 mg | ORAL | @ 18:00:00 | Stop: 2020-08-24 | NDC 00904673061

## 2020-08-24 MED ADMIN — SODIUM BICARBONATE 1 MEQ/ML (8.4 %) IV SOLN [7309]: 20 mL | INTRAMUSCULAR | @ 18:00:00 | Stop: 2020-08-24 | NDC 51754501101

## 2020-08-26 ENCOUNTER — Encounter: Admit: 2020-08-26 | Discharge: 2020-08-26 | Payer: MEDICARE

## 2020-08-26 ENCOUNTER — Ambulatory Visit: Admit: 2020-08-26 | Discharge: 2020-08-26 | Payer: MEDICARE

## 2020-08-26 DIAGNOSIS — N19 Unspecified kidney failure: Secondary | ICD-10-CM

## 2020-08-26 DIAGNOSIS — Z79899 Other long term (current) drug therapy: Secondary | ICD-10-CM

## 2020-08-26 DIAGNOSIS — I2510 Atherosclerotic heart disease of native coronary artery without angina pectoris: Secondary | ICD-10-CM

## 2020-08-26 DIAGNOSIS — I10 Essential (primary) hypertension: Secondary | ICD-10-CM

## 2020-08-26 DIAGNOSIS — R809 Proteinuria, unspecified: Secondary | ICD-10-CM

## 2020-08-26 DIAGNOSIS — Z940 Kidney transplant status: Secondary | ICD-10-CM

## 2020-08-26 DIAGNOSIS — D849 Immunodeficiency, unspecified: Secondary | ICD-10-CM

## 2020-08-26 DIAGNOSIS — Z20828 Contact with and (suspected) exposure to other viral communicable diseases: Secondary | ICD-10-CM

## 2020-08-26 DIAGNOSIS — N186 End stage renal disease: Secondary | ICD-10-CM

## 2020-08-26 DIAGNOSIS — D649 Anemia, unspecified: Secondary | ICD-10-CM

## 2020-08-26 DIAGNOSIS — R897 Abnormal histological findings in specimens from other organs, systems and tissues: Secondary | ICD-10-CM

## 2020-08-26 DIAGNOSIS — D472 Monoclonal gammopathy: Secondary | ICD-10-CM

## 2020-08-26 DIAGNOSIS — E119 Type 2 diabetes mellitus without complications: Secondary | ICD-10-CM

## 2020-08-26 DIAGNOSIS — Z9189 Other specified personal risk factors, not elsewhere classified: Secondary | ICD-10-CM

## 2020-08-26 LAB — PROTEIN/CR RATIO,UR RAN
Lab: 58 mg/dL (ref 5.0–8.0)
Lab: 9 mg/dL (ref 1.005–1.030)

## 2020-08-26 LAB — CBC AND DIFF
Lab: 3.7 M/UL — ABNORMAL LOW (ref 4.0–5.0)
Lab: 8.3 K/UL (ref 4.5–11.0)

## 2020-08-26 LAB — COMPREHENSIVE METABOLIC PANEL
Lab: 0.4 mg/dL — ABNORMAL HIGH (ref 0.3–1.2)
Lab: 1.7 mg/dL — ABNORMAL HIGH (ref 0.4–1.00)
Lab: 136 mg/dL — ABNORMAL HIGH (ref 70–100)
Lab: 141 MMOL/L — ABNORMAL LOW (ref 137–147)
Lab: 22 U/L (ref 7–40)
Lab: 4 g/dL — ABNORMAL LOW (ref 3.5–5.0)
Lab: 4.4 MMOL/L — ABNORMAL LOW (ref 3.5–5.1)
Lab: 6.2 g/dL (ref 6.0–8.0)
Lab: 68 U/L (ref 25–110)

## 2020-08-26 LAB — URINALYSIS DIPSTICK REFLEX TO CULTURE

## 2020-08-26 LAB — ALLOSURE

## 2020-08-26 LAB — URINALYSIS MICROSCOPIC REFLEX TO CULTURE

## 2020-08-26 LAB — MAGNESIUM: Lab: 1.2 mg/dL — ABNORMAL LOW (ref 1.6–2.6)

## 2020-08-26 LAB — URIC ACID: Lab: 5.4 mg/dL (ref 2.0–7.0)

## 2020-08-26 LAB — PHOSPHORUS: Lab: 3.7 mg/dL — ABNORMAL HIGH (ref 2.0–4.5)

## 2020-08-27 ENCOUNTER — Encounter: Admit: 2020-08-27 | Discharge: 2020-08-27 | Payer: MEDICARE

## 2020-08-27 MED ORDER — TACROLIMUS 1 MG PO CAP
6 mg | ORAL_CAPSULE | Freq: Two times a day (BID) | ORAL | 11 refills | Status: CN
Start: 2020-08-27 — End: ?

## 2020-08-27 NOTE — Telephone Encounter
-----   Message from Fabio Neighbors, MD sent at 08/27/2020 11:15 AM CST -----  6/6  ----- Message -----  From: Burnis Kingfisher, RN  Sent: 08/27/2020   8:52 AM CST  To: Fabio Neighbors, MD    FK 7.1 on Prograf 5/5

## 2020-08-27 NOTE — Telephone Encounter
Reviewed pt's labs from 08/26/20 with Dr. Earney Hamburg. Creatinine stable at 1.70. FK subtherapeutic at 7.1. Will increase Prograf from 5/5 to 6/6. Called pt to discuss medication dose change, pt read back instructions correctly. Pt will repeat labs in one week.

## 2020-09-01 ENCOUNTER — Ambulatory Visit: Admit: 2020-09-01 | Discharge: 2020-09-01 | Payer: MEDICARE

## 2020-09-01 ENCOUNTER — Encounter: Admit: 2020-09-01 | Discharge: 2020-09-01 | Payer: MEDICARE

## 2020-09-01 DIAGNOSIS — Z79899 Other long term (current) drug therapy: Secondary | ICD-10-CM

## 2020-09-01 DIAGNOSIS — D849 Immunodeficiency, unspecified: Secondary | ICD-10-CM

## 2020-09-01 DIAGNOSIS — Z940 Kidney transplant status: Secondary | ICD-10-CM

## 2020-09-01 LAB — URINALYSIS DIPSTICK REFLEX TO CULTURE
Lab: 1 (ref 1.005–1.030)
Lab: 6 (ref 5.0–8.0)
Lab: NEGATIVE
Lab: NEGATIVE
Lab: NEGATIVE
Lab: NEGATIVE
Lab: NEGATIVE
Lab: NEGATIVE

## 2020-09-01 LAB — URINALYSIS MICROSCOPIC REFLEX TO CULTURE

## 2020-09-01 LAB — MAGNESIUM: Lab: 1.4 mg/dL — ABNORMAL LOW (ref 1.6–2.6)

## 2020-09-01 LAB — CBC AND DIFF
Lab: 0 K/UL (ref 0–0.20)
Lab: 0.1 K/UL (ref 0–0.45)
Lab: 0.4 K/UL — ABNORMAL LOW (ref 1.0–4.8)
Lab: 0.6 K/UL (ref 0–0.80)
Lab: 1 % (ref 0–2)
Lab: 11 g/dL — ABNORMAL LOW (ref 12.0–15.0)
Lab: 2 % (ref 0–5)
Lab: 28 pg (ref 26–34)
Lab: 3.8 M/UL — ABNORMAL LOW (ref >60)
Lab: 32 g/dL (ref 32.0–36.0)
Lab: 33 % — ABNORMAL LOW (ref 36–45)
Lab: 5.4 K/UL (ref 1.8–7.0)
Lab: 6.7 K/UL — ABNORMAL HIGH (ref 4.5–11.0)
Lab: 7 % — ABNORMAL LOW (ref 24–44)
Lab: 81 % — ABNORMAL HIGH (ref 41–77)
Lab: 89 FL (ref 80–100)
Lab: 9 % (ref 4–12)
Lab: 9.4 FL (ref 7–11)

## 2020-09-01 LAB — COMPREHENSIVE METABOLIC PANEL
Lab: 1.6 mg/dL — ABNORMAL HIGH (ref 0.4–1.00)
Lab: 108 MMOL/L (ref 98–110)
Lab: 141 MMOL/L (ref 137–147)
Lab: 213 mg/dL — ABNORMAL HIGH (ref 70–100)
Lab: 4.6 MMOL/L (ref 3.5–5.1)
Lab: 6.6 g/dL (ref 6.0–8.0)

## 2020-09-01 LAB — URIC ACID: Lab: 4.7 mg/dL (ref 2.0–7.0)

## 2020-09-01 LAB — PROTEIN/CR RATIO,UR RAN
Lab: 0.2
Lab: 18 mg/dL
Lab: 89 mg/dL

## 2020-09-01 LAB — PHOSPHORUS: Lab: 3.8 mg/dL (ref 2.0–4.5)

## 2020-09-01 LAB — TACROLIMUS LC-MS/MS: Lab: 11

## 2020-09-03 ENCOUNTER — Encounter: Admit: 2020-09-03 | Discharge: 2020-09-03 | Payer: MEDICARE

## 2020-09-03 NOTE — Telephone Encounter
-----   Message from Fabio Neighbors, MD sent at 09/02/2020 12:49 PM CST -----  Lets do 5 mg BID, I think she is on 6mg  BID  ----- Message -----  From: Interface, In Results Misys  Sent: 09/01/2020  10:43 AM CST  To: Fabio Neighbors, MD

## 2020-09-03 NOTE — Telephone Encounter
Reviewed pt's labs from 09/01/20. Creatinine stable at 1.67. FK 11.7. Will decrease Prograf from 6/6 to 5/5. Called pt with medication dose change, pt read back correctly.   Pt also inquiring about FMLA paperwork- insurance needing medical update. Will fax last office visit note to [phone] ATTN: [employer]. Claim No. [id number]

## 2020-09-09 ENCOUNTER — Encounter

## 2020-09-09 DIAGNOSIS — E785 Hyperlipidemia, unspecified: Secondary | ICD-10-CM

## 2020-09-09 DIAGNOSIS — E0821 Diabetes mellitus due to underlying condition with diabetic nephropathy: Secondary | ICD-10-CM

## 2020-09-09 DIAGNOSIS — D649 Anemia, unspecified: Secondary | ICD-10-CM

## 2020-09-09 DIAGNOSIS — Z940 Kidney transplant status: Secondary | ICD-10-CM

## 2020-09-09 DIAGNOSIS — R897 Abnormal histological findings in specimens from other organs, systems and tissues: Secondary | ICD-10-CM

## 2020-09-09 DIAGNOSIS — Z79899 Other long term (current) drug therapy: Secondary | ICD-10-CM

## 2020-09-09 DIAGNOSIS — I10 Essential (primary) hypertension: Secondary | ICD-10-CM

## 2020-09-09 DIAGNOSIS — D849 Immunodeficiency, unspecified: Secondary | ICD-10-CM

## 2020-09-09 DIAGNOSIS — I2510 Atherosclerotic heart disease of native coronary artery without angina pectoris: Secondary | ICD-10-CM

## 2020-09-09 DIAGNOSIS — R809 Proteinuria, unspecified: Secondary | ICD-10-CM

## 2020-09-09 DIAGNOSIS — N19 Unspecified kidney failure: Secondary | ICD-10-CM

## 2020-09-09 DIAGNOSIS — N186 End stage renal disease: Secondary | ICD-10-CM

## 2020-09-09 DIAGNOSIS — D472 Monoclonal gammopathy: Secondary | ICD-10-CM

## 2020-09-09 DIAGNOSIS — E119 Type 2 diabetes mellitus without complications: Secondary | ICD-10-CM

## 2020-09-09 LAB — COMPREHENSIVE METABOLIC PANEL
Lab: 140 MMOL/L (ref 137–147)
Lab: 4.9 MMOL/L (ref 3.5–5.1)

## 2020-09-09 LAB — CBC AND DIFF
Lab: 0 K/UL (ref 0–0.20)
Lab: 0 K/UL (ref 0–0.45)
Lab: 0.4 K/UL — ABNORMAL LOW (ref 1.0–4.8)
Lab: 0.6 K/UL (ref 0–0.80)
Lab: 11 g/dL — ABNORMAL LOW (ref 12.0–15.0)
Lab: 29 pg (ref 26–34)
Lab: 3.8 M/UL — ABNORMAL LOW (ref 4.0–5.0)
Lab: 33 g/dL (ref 32.0–36.0)
Lab: 7.4 K/UL (ref 4.5–11.0)
Lab: 9 % (ref 4–12)

## 2020-09-09 LAB — URINALYSIS DIPSTICK REFLEX TO CULTURE
Lab: NEGATIVE % — ABNORMAL LOW (ref 0–5)
Lab: NEGATIVE FL (ref 7–11)
Lab: NEGATIVE K/UL (ref 1.8–7.0)

## 2020-09-09 LAB — PROTEIN/CR RATIO,UR RAN
Lab: 127 mg/dL (ref 80–100)
Lab: 25 mg/dL — ABNORMAL LOW (ref 36–45)

## 2020-09-09 LAB — MAGNESIUM: Lab: 1.3 mg/dL — ABNORMAL LOW (ref 1.6–2.6)

## 2020-09-09 LAB — URINALYSIS MICROSCOPIC REFLEX TO CULTURE

## 2020-09-09 LAB — PHOSPHORUS: Lab: 4.4 mg/dL (ref 2.0–4.5)

## 2020-09-09 LAB — URIC ACID: Lab: 5.5 mg/dL (ref 2.0–7.0)

## 2020-09-09 MED FILL — MYCOPHENOLATE SODIUM 180 MG PO TBEC: 180 mg | ORAL | 30 days supply | Qty: 240 | Fill #3 | Status: AC

## 2020-09-09 MED FILL — SULFAMETHOXAZOLE-TRIMETHOPRIM 400-80 MG PO TAB: 80/400 mg | ORAL | 30 days supply | Qty: 30 | Fill #3 | Status: AC

## 2020-09-09 NOTE — Patient Instructions
It was a pleasure meeting with you today, if you should have any questions or concerns (including about your medication list) please contact the Kidney Transplant Clinic at [phone].     Please make your return appointment for 3 weeks Corena Herter, MD    Please repeat your labs weekly    Please monitor your home blood pressures      For up to date information on the COVID-19 virus, visit the [facility] website. [email or web address]  ? General supportive care during cold and flu season and infection prevention reminders:    o Wash hands often with soap and water for at least 20 seconds   o Cover your mouth and nose   o Social distancing: try to maintain 6 feet between you and other people   o Stay home if sick and symptoms mild or manageable?  ? If you must be around people wear a mask    ? If you are having symptoms of a lower respiratory infection (cough, shortness of breath) and/or fever AND either traveled in last 30 days (internationally or to region of exposure) OR known exposure to patient with COVID19:     o Call your primary care provider for questions or health needs.   ? Tell your doctor about your recent travel, potential exposure and your symptoms     o In a medical emergency, call 911 or go to the nearest emergency room.      In accordance with expert opinions/recommendations by the American Society of Transplantation, the [facility] in review of available literature WILL BE RECOMMENDING all transplanted patients who are AT LEAST 3 MONTHS post transplant obtain the Covid-19 mRNA vaccine who do not have overt contraindications. There are currently two versions of this vaccine from Pfizer and [place] and is recommended to be given in 2 doses.      The CDC, Transplant community and FDA have all recommended a third BOOSTER dose of the COVID-19 vaccine for immunocompromised patients. Since you are a transplant patient on immunosuppression it is recommended that you obtain this 3rd dose of the same vaccine as the prior two doses at 3 months post transplant    The best, most time-efficient way for patients and others to learn the latest status and process is to visit [email or web address] or call our COVID-19 Information Hotline at 913-94-COVID 234-675-3743) for a frequently updated recorded message about vaccine availability and scheduling. We understand patients are eager to receive their vaccines but calling can result in long hold times and reduce our primary care offices' ability to serve patients' other needs. Our primary care offices cannot schedule vaccinations at this time.

## 2020-09-09 NOTE — Progress Notes
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 On PD Jan 2019  DDRT  KPDI 92%, cPRA 0%  Physical Crossmatch B cell negative T cell negative  CMV D -/ R -  Induction: Thymo  Maintenance IS: Dual  IS   PD not removed with surgery   Post-op Complications:  - None    Referring Nephrologist:  Elmer Bales  [address]  [name]   [place] [address]  Phone: [phone]  Fax: [phone]     Dear Dr. Elmer Bales,    We had the pleasure of meeting Jacqueline Shields in the [facility] for routine evaluation of her renal transplant. She was last seen 08/18/2020 and has been doing well since that time. Accompanied by her husband.    She has not been measuring her home BPs due to lack of motivation.     Home glucose readings are 100-200. She remains on Novolog and Lantus.    She reports strong appetite.    She still has a cough which has subsided but is exacerbated when talking or overexertion with some improvement. She denies DOE, PND. Her COVID and RVP have been negative on previous clinic visit. This cough has been present since transplant. She has not taken any medications or interventions to provide relief. She has made no interventions.       She reports compliance with her immunosuppresion regimen.        REVIEW OF SYSTEMS: Comprehensive 14-point ROS reviewed  Positives noted in HPI otherwise negative.    Past History:  Medical History:   Diagnosis Date   ? Abnormal biopsy of kidney    ? Anemia    ? 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 Diabetes mellitus (HCC)    ? ESRD (end stage renal disease) (HCC)    ? Hypertension    ? Kidney failure    ? MGUS (monoclonal gammopathy of unknown significance)    ? Proteinuria        Surgical History: Procedure Laterality Date   ? ANGIOGRAPHY CORONARY ARTERY WITH LEFT HEART CATHETERIZATION N/A 07/03/2020    Performed by Harley Alto, MD at [facility] CATH LAB   ? POSSIBLE PERCUTANEOUS CORONARY STENT PLACEMENT WITH ANGIOPLASTY N/A 07/03/2020    Performed by Harley Alto, MD at [facility] CATH LAB   ? 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 REMOVAL TUNNELED INTRAPERITONEAL CATHETER Bilateral 08/24/2020    Performed by Nicholos Johns, MD at [facility] OR   ? CYSTOURETHROSCOPY WITH REMOVAL FOREIGN BODY/ CALCULUS/ STENT FROM URETHRA/ BLADDER - SIMPLE Right 08/24/2020    Performed by Marylen Ponto, MD at [facility] OR   ? CATHETER IMPLANT/REVISION      PD cath   ? HX HYSTERECTOMY     ? HX LITHOTRIPSY         Social History     Socioeconomic History   ? Marital status: Married     Spouse name: Not on file   ? Number of children: Not on file   ? Years of education: Not on file   ? Highest education level: Not on  file   Occupational History   ? Not on file   Tobacco Use   ? Smoking status: Never Smoker   ? Smokeless tobacco: Never Used   Vaping Use   ? Vaping Use: Never used   Substance and Sexual Activity   ? Alcohol use: Not Currently   ? Drug use: Never   ? Sexual activity: Not on file   Other Topics Concern   ? Not on file   Social History Narrative   ? Not on file       Family History   Problem Relation Age of Onset   ? Diabetes Mother    ? Hypertension Mother    ? Diabetes Father    ? Hypertension Father    ? Cancer-Hematologic Father    ? Diabetes Sister    ? Migraines Sister    ? Cancer Sister         Endometrial       No Known Allergies    Current Medications:    Current Outpatient Medications:   ?  acetaminophen (TYLENOL) 325 mg tablet, Take one tablet to two tablets by mouth every 6 hours as needed., Disp: 120 tablet, Rfl: 1  ?  acyclovir (ZOVIRAX) 400 mg tablet, Take one tablet by mouth every 12 hours. Indications: infection prophylaxis, medical, Disp: 60 tablet, Rfl: 2  ?  aspirin EC (ASPIR-LOW) 81 mg tablet, Take one tablet by mouth daily. Take with food., Disp: 90 tablet, Rfl: 3  ?  calcitrioL (ROCALTROL) 0.25 mcg capsule, Take one capsule by mouth daily., Disp: 90 capsule, Rfl: 3  ?  cholecalciferol (VITAMIN D-3) 50 mcg (2,000 unit) tablet, Take one tablet by mouth daily., Disp: 90 tablet, Rfl: 3  ?  docusate (COLACE) 100 mg capsule, Take one capsule by mouth twice daily., Disp: 180 capsule, Rfl: 0  ?  famotidine (PEPCID) 20 mg tablet, Take one tablet by mouth daily. Indications: stress ulcer prevention, Disp: 30 tablet, Rfl: 5  ?  ferrous sulfate (FEOSOL) 325 mg (65 mg iron) tablet, Take one tablet by mouth at bedtime daily. Take on an empty stomach at least 1 hour before or 2 hours after food., Disp: 30 tablet, Rfl: 3  ?  insulin aspart (U-100) (NOVOLOG FLEXPEN U-100 INSULIN) 100 unit/mL (3 mL) PEN, Follow Novolog mealtime dose taper to match prednisone taper.  Once off of prednisone, continue 5 units with meals and add 0-14 units per correction scale.  Max 84 units/day (Patient taking differently: 20 Units. Follow Novolog mealtime dose taper to match prednisone taper.  Once off of prednisone, continue 5 units with meals and add 0-14 units per correction scale.  Max 84 units/day), Disp: 45 mL, Rfl: 1  ?  insulin glargine (LANTUS SOLOSTAR U-100 INSULIN) 100 unit/mL (3 mL) subcutaneous PEN, Inject 65 Units under the skin twice daily., Disp: 45 mL, Rfl: 1  ?  insulin pen needles (disposable) (BD UF NANO PEN NEEDLES) 32 gauge x 5/32 pen needle, Use one each as directed as Needed. Use with insulin injections., Disp: 300 each, Rfl: 3  ?  mycophenolate DR (MYFORTIC) 180 mg TbEC tablet, Take four tablets by mouth twice daily., Disp: 240 tablet, Rfl: 11  ?  polyethylene glycol 3350 (MIRALAX) 17 gram/dose powder, Take seventeen g by mouth daily., Disp: 527 g, Rfl: 0  ?  rosuvastatin (CRESTOR) 20 mg tablet, Take one tablet by mouth daily., Disp: 90 tablet, Rfl: 1  ?  tacrolimus (PROGRAF) 1 mg capsule, Take six capsules by  mouth twice daily., Disp: 360 capsule, Rfl: 11  ?  trimethoprim/sulfamethoxazole (BACTRIM) 80/400 mg tablet, Take one tablet by mouth daily. Indications: infection prophylaxis, medical, Disp: 30 tablet, Rfl: 5    Physical Exam:  Vitals:    09/09/20 1114 09/09/20 1116   BP: (!) 162/93 (!) 140/82   BP Source: Arm, Right Upper Arm, Right Upper   Patient Position: Sitting Standing   Pulse: 79 84   SpO2: 97%    Weight: 113.9 kg (251 lb)    Height: 172.7 cm (68)    PainSc: Zero    Body mass index is 38.16 kg/m?Marland Kitchen      General: NAD, A+Ox4, calm and pleasant. Appears to be stated age.   HENT: Unremarkable, no oral lesions, wearing mask  Neck: Normal ROM, no LAD, no JVD  Lungs: Bilat. CTA  CV: RRR, S1, S2 without carotid bruit or murmur, no edema  Abdomen: Soft, N/D, N/T without hepatosplenomegaly, PD cath exit site with adhesive closure, central obesity with mild pannus  Incision: RLQ Healed  M/S: Normal ROM and strength  Neuro: Nonfocal deficits without tremors  Skin: No skin rash  Psyc: Stable      Laboratory studies:     CMP:  CMP Latest Ref Rng & Units 09/01/2020 08/26/2020 08/19/2020 08/14/2020 08/12/2020   NA 137 - 147 MMOL/L 141 141 142 142 145   K 3.5 - 5.1 MMOL/L 4.6 4.4 4.7 4.3 5.1   CL 98 - 110 MMOL/L 108 107 107 109 110   CO2 21 - 30 MMOL/L 24 24 25 23  20(L)   GAP 3 - 12 9 10 10 10  15(H)   BUN 7 - 25 MG/DL 21 16(X) 21 15 14    CR 0.4 - 1.00 MG/DL 0.96(E) 4.54(U) 9.81(X) 1.80(H) 2.03(H)   GLUX 70 - 100 MG/DL 914(N) 829(F) 621(H) 086(V) 85   CA 8.5 - 10.6 MG/DL 8.5 8.7 8.5 7.8(I) 8.8   TP 6.0 - 8.0 G/DL 6.6 6.2 6.3 6.1 6.4   ALB 3.5 - 5.0 G/DL 4.0 4.0 3.7 3.8 3.9   ALKP 25 - 110 U/L 70 68 66 71 68   ALT 7 - 56 U/L 25 23 25 19 19    TBILI 0.3 - 1.2 MG/DL 0.4 0.4 0.4 0.4 0.4   GFR >60 mL/min - - - - -   GFRAA >60 mL/min - - - - -     Hemoglobin A1C (%)   Date Value   07/15/2020 11.4 (H)   05/05/2020 9.5 (H)   04/30/2019 10.7 (H)     PTH Hormone (PG/ML)   Date Value   07/15/2020 272.3 (H)     No results found for: LIPASE  No results found for: AMY  BK Virus Plasma Quant (no units)   Date Value   08/26/2020     NOT DETECTED  Reference range: NOT DETECTED  Unit: copies/mL  TESTING PERFORMED AT LOW VOLUME ON PLASMA SPECIMEN FOR BKV, MAY  AFFECT RESULTS.  Marland Kitchen  Assay Range: 39 copies/mL to 1.00E+10 copies/mL  .  The limit of quantitation (LOQ) is 39 copies/mL. BK virus DNA  detected below the  LOQ will be reported as Detected:<39 copies/mL.  .  This test was developed and its performance characteristics  determined by  [facility]. It has not been cleared or approved by the U.S.  Food and Drug  Administration. Results should be used in conjunction with clinical  findings,  and should not form the sole basis for a diagnosis or  treatment  decision.  ____________________________________________________________  Testing Performed At:  [facility]  [address]  [place] [address]  Laboratory Director: Lind Guest Ph.D., BCLD (ABB)  CLIA#: [id number]  Phone: [phone]     08/19/2020 BK Virus Not Detected   08/05/2020 BK Virus Not Detected   07/20/2020 BK Virus Not Detected     No results found for: CMVDNAPCR  No results found for: IUMLBLD  No results found for: COPIES  No results found for: EBVDNAQT    TACROLIMUS LEVEL:  No results found for: TACROLIMUS    CBC with Diff:  CBC with Diff Latest Ref Rng & Units 09/01/2020 08/26/2020   WBC 4.5 - 11.0 K/UL 6.7 8.3   RBC 4.0 - 5.0 M/UL 3.81(L) 3.72(L)   HGB 12.0 - 15.0 GM/DL 11.0(L) 10.9(L)   HCT 36 - 45 % 33.9(L) 32.7(L)   MCV 80 - 100 FL 89.1 87.9   MCH 26 - 34 PG 28.8 29.4   MCHC 32.0 - 36.0 G/DL 30.8 65.7   RDW 11 - 15 % 17.0(H) 17.1(H)   PLT 150 - 400 K/UL 220 217   MPV 7 - 11 FL 9.4 9.3   NEUT 41 - 77 % 81(H) 83(H)   ANC 1.8 - 7.0 K/UL 5.47 6.94   LYMA 24 - 44 % 7(L) 6(L)   ALYM 1.0 - 4.8 K/UL 0.46(L) 0.51(L)   MONA 4 - 12 % 9 9   AMONO 0 - 0.80 K/UL 0.61 0.76   EOSA 0 - 5 % 2 1   AEOS 0 - 0.45 K/UL 0.11 0.07   BASA 0 - 2 % 1 1   ABAS 0 - 0.20 K/UL 0.04 0.05     Lab Results   Component Value Date/Time    IRON 54 07/17/2020 06:06 AM    TIBC 224 (L) 07/17/2020 06:06 AM    PSAT 24 (L) 07/17/2020 06:06 AM    FERRITIN 719 (H) 07/17/2020 06:06 AM       Urinalysis:  Lab Results   Component Value Date/Time    UCOLOR YELLOW 09/01/2020 09:55 AM    TURBID CLEAR 09/01/2020 09:55 AM    USPGR 1.017 09/01/2020 09:55 AM    UPH 6.0 09/01/2020 09:55 AM    UPROTEIN NEG 09/01/2020 09:55 AM    UAGLU 3+ (A) 09/01/2020 09:55 AM    UKET NEG 09/01/2020 09:55 AM    UBILE NEG 09/01/2020 09:55 AM    UBLD NEG 09/01/2020 09:55 AM    UROB NORMAL 09/01/2020 09:55 AM     Protein/CR ratio (no units)   Date Value   09/01/2020 0.2   08/26/2020 0.2   08/19/2020 0.2   08/14/2020 0.2   08/05/2020 0.2       Imaging:    Results for orders placed during the hospital encounter of 07/29/20    CHEST 2 VIEWS    Impression  No evidence of acute cardiopulmonary disease.      Finalized by Arlana Hove, M.D. on 07/29/2020 12:15 PM. Dictated by Arlana Hove, M.D. on 07/29/2020 12:14 PM.      Assessment and Plan: labs rev'd from 09/01/2020 and labs avail after clinic visit  Ms. 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 S/p DDRT on 07/15/20. cPRA 0%, KDPI 92%   ?  #) Immunosuppression - On maintenance dual drug therapy  - Goal tacrolimus trough first 3 months after transplant is 8-12 mcg/L (HPLC/LCMS). Tac level pending  - Tacrolimus 5 mg bid.  MPA 720 mg BID and steroid minimization protocol due to poorly controled DM and 0 PRA    - prednisone stopped at discharge.   ?  #) Deceased donor renal transplant: [facility]?/ATN  - bl Cr TBD. Cr on admission: 1.8 mg/dl (above baseline) Tac level pending. Minimal proteinuria.  - Transplant Ultrasound 07/15/2020: Unremarkable   - Ureteral stent/PD cath schedule 08/24/2020  - would add ACEI for renal protection when Creatinine stable  - Pt received Evusheld for preventative COVID-19 measures in the immunocompromised patient    #) ID Proph  - CMV Donor(-)/ Recipient(-) to treat with Acyclovir for 3 months post transplant  - PJP Prophylax: To treat with Bactrim/Septra for 6 months post transplant  - Nystatin swish for 2 weeks post transplant      ?#) Dry cough   - slightly improved.  - advised to try conventional measures and report s/s of exacerbation   - likely related to intubation, if persists would refer to ENT for further eval    ?    #) BP - fair control in clinic  - Monitor for now   - Encouraged pt again to resume home monitoring  ?  #) Type II DM - improving control   - Endocrine consulted while inpatient  - Hemoglobin A 1C prior to transplant - 11.4% (07/15/20). Check in one month  - Will benefit from SGLT2 inhibitor outpatient   - Cont Basaglar and Novolog  ?  #) Anemia - Hgb average of 11  - Low iron stores on iron supplements  ?  # CKD BMD/Hypocalcemia    - PTH 273, Vit D 28  - Vit D 2000 units daily  - Cont Calcitriol 0.25 mg daily     #) Health maintenance by PCP   Skin cancer surveillance: Annual follow up with Dermatology for skin eval due to long term immunosuppression   Cancer screening: Mammogram, Pap Smear, colonoscopy per guidelines   Annual high dose flu vaccine    Avoid all live vaccines. When clinically indicated or age appropriate, can receive the Shingrix vaccine one year post transplant.      Fully vaccinated for COVID-19 [Moderna] x 2 doses. 3rd dose at three months post transplant        RTC in 3 weeks  Labs weekly  Renal txp u/s if Tac level therap given increasing Creatinine      Sincerely,    Jacqueline Bevels, APRN-NP  Transplant Nephrology Nurse Practitioner    Cc: Elmer Bales  Cc: Jacqueline Shields    Please contact the [facility] at [phone] for any transplant related questions or concerns that may arise.      Total time minutes: 40  Reviewed data prior to visit: 10 minutes  Face to face time spent with patient:  20 minutes   After visit time spent on documentation and coordination of care: 10 minutes   Documentation review includes review of past medical history, Nephrology notes, interpretation of laboratory results  Face to face time included counseling and education patient and family/caregiver, examination, communicated laboratory results, care coordination

## 2020-09-11 MED ORDER — TACROLIMUS 1 MG PO CAP
4 mg | ORAL_CAPSULE | Freq: Two times a day (BID) | ORAL | 11 refills | Status: CN
Start: 2020-09-11 — End: ?

## 2020-09-11 NOTE — Telephone Encounter
-----   Message from Fabio Neighbors, MD sent at 09/10/2020 12:59 PM CST -----  4/4  ----- Message -----  From: Burnis Kingfisher, RN  Sent: 09/10/2020  12:57 PM CST  To: Fabio Neighbors, MD    FK 12.6 on Prograf 5/5. Dose decreased last week for level of 11.7.

## 2020-09-11 NOTE — Telephone Encounter
Reviewed pt's labs from 09/09/20. Creatinine stable at 1.83. FK supratherapeutic at 12.6. Discussed with Dr. Earney Hamburg and will decrease Prograf from 5/5 to 4/4. MyChart message sent to pt with medication dose change, pt confirmed dose change. Pt will repeat labs next week.

## 2020-09-16 ENCOUNTER — Encounter

## 2020-09-16 DIAGNOSIS — Z940 Kidney transplant status: Secondary | ICD-10-CM

## 2020-09-16 DIAGNOSIS — D849 Immunodeficiency, unspecified: Secondary | ICD-10-CM

## 2020-09-16 DIAGNOSIS — Z79899 Other long term (current) drug therapy: Secondary | ICD-10-CM

## 2020-09-16 LAB — COMPREHENSIVE METABOLIC PANEL
Lab: 0.4 mg/dL — ABNORMAL HIGH (ref 0.3–1.2)
Lab: 1.8 mg/dL — ABNORMAL HIGH (ref 0.4–1.00)
Lab: 10 K/UL — ABNORMAL LOW (ref 3–12)
Lab: 104 MMOL/L — ABNORMAL LOW (ref 98–110)
Lab: 137 MMOL/L — ABNORMAL LOW (ref >60)
Lab: 22 U/L (ref 7–40)
Lab: 23 MMOL/L (ref 21–30)
Lab: 25 U/L (ref 7–56)
Lab: 32 mL/min — ABNORMAL LOW (ref >60)
Lab: 6.3 g/dL (ref 6.0–8.0)
Lab: 8.5 mg/dL (ref 8.5–10.6)

## 2020-09-16 LAB — URINALYSIS MICROSCOPIC REFLEX TO CULTURE

## 2020-09-16 LAB — URINALYSIS DIPSTICK REFLEX TO CULTURE
Lab: NEGATIVE
Lab: NEGATIVE
Lab: NEGATIVE
Lab: NEGATIVE
Lab: NEGATIVE
Lab: NEGATIVE (ref 0–5)

## 2020-09-16 LAB — CBC AND DIFF
Lab: 0 K/UL (ref 0–0.20)
Lab: 0 K/UL (ref 0–0.45)
Lab: 10 % (ref 4–12)
Lab: 30 pg — ABNORMAL HIGH (ref 26–34)
Lab: 7 % — ABNORMAL LOW (ref 24–44)

## 2020-09-16 LAB — PROTEIN/CR RATIO,UR RAN
Lab: 16 mg/dL
Lab: 89 mg/dL

## 2020-09-16 LAB — ALLOSURE

## 2020-09-16 MED FILL — ACYCLOVIR 400 MG PO TAB: 400 mg | ORAL | 30 days supply | Qty: 60 | Fill #3 | Status: CP

## 2020-09-16 MED FILL — FAMOTIDINE 20 MG PO TAB: 20 mg | ORAL | 30 days supply | Qty: 30 | Fill #3 | Status: AC

## 2020-09-17 NOTE — Progress Notes
Reviewed pt's labs from 09/16/20. Creatinine stable at 1.84. FK therapeutic at 9.0. No changes in medications. Pt will repeat labs in one week.

## 2020-09-23 ENCOUNTER — Ambulatory Visit: Admit: 2020-09-23 | Discharge: 2020-09-23 | Payer: MEDICARE

## 2020-09-23 ENCOUNTER — Encounter: Admit: 2020-09-23 | Discharge: 2020-09-23 | Payer: MEDICARE

## 2020-09-23 DIAGNOSIS — Z940 Kidney transplant status: Secondary | ICD-10-CM

## 2020-09-23 DIAGNOSIS — Z79899 Other long term (current) drug therapy: Secondary | ICD-10-CM

## 2020-09-23 DIAGNOSIS — D849 Immunodeficiency, unspecified: Secondary | ICD-10-CM

## 2020-09-23 LAB — URINALYSIS DIPSTICK REFLEX TO CULTURE
Lab: 1 (ref 1.005–1.030)
Lab: NEGATIVE
Lab: NEGATIVE
Lab: NEGATIVE
Lab: NEGATIVE
Lab: NEGATIVE
Lab: NEGATIVE

## 2020-09-23 LAB — URINALYSIS MICROSCOPIC REFLEX TO CULTURE

## 2020-09-23 LAB — PROTEIN/CR RATIO,UR RAN
Lab: 0.2
Lab: 102 mg/dL
Lab: 19 mg/dL

## 2020-09-23 LAB — MAGNESIUM: Lab: 1.5 mg/dL — ABNORMAL LOW (ref 1.6–2.6)

## 2020-09-23 LAB — CBC AND DIFF: Lab: 4.2 K/UL — ABNORMAL LOW (ref 4.5–11.0)

## 2020-09-23 LAB — PHOSPHORUS: Lab: 4.2 mg/dL (ref 2.0–4.5)

## 2020-09-23 LAB — URIC ACID: Lab: 6.7 mg/dL (ref 2.0–7.0)

## 2020-09-23 LAB — COMPREHENSIVE METABOLIC PANEL: Lab: 142 MMOL/L — ABNORMAL HIGH (ref 137–147)

## 2020-09-25 ENCOUNTER — Encounter: Admit: 2020-09-25 | Discharge: 2020-09-25 | Payer: MEDICARE

## 2020-09-25 NOTE — Telephone Encounter
Pt returned call. Pt denies any s/sx of UTI. Will continue to monitor at this time.

## 2020-09-25 NOTE — Telephone Encounter
Reviewed pt's labs from 09/23/20. Creatinine 2.09, FK therapeutic at 8.2. UA suspicious for UTI. Called pt to discuss, LVM.

## 2020-09-29 ENCOUNTER — Encounter: Admit: 2020-09-29 | Discharge: 2020-09-29 | Payer: MEDICARE

## 2020-09-30 ENCOUNTER — Encounter: Admit: 2020-09-30 | Discharge: 2020-09-30 | Payer: MEDICARE

## 2020-09-30 ENCOUNTER — Ambulatory Visit: Admit: 2020-09-30 | Discharge: 2020-09-30 | Payer: MEDICARE

## 2020-09-30 DIAGNOSIS — Z79899 Other long term (current) drug therapy: Secondary | ICD-10-CM

## 2020-09-30 DIAGNOSIS — D472 Monoclonal gammopathy: Secondary | ICD-10-CM

## 2020-09-30 DIAGNOSIS — Z940 Kidney transplant status: Secondary | ICD-10-CM

## 2020-09-30 DIAGNOSIS — N19 Unspecified kidney failure: Secondary | ICD-10-CM

## 2020-09-30 DIAGNOSIS — D849 Immunodeficiency, unspecified: Secondary | ICD-10-CM

## 2020-09-30 DIAGNOSIS — N186 End stage renal disease: Secondary | ICD-10-CM

## 2020-09-30 DIAGNOSIS — R809 Proteinuria, unspecified: Secondary | ICD-10-CM

## 2020-09-30 DIAGNOSIS — R897 Abnormal histological findings in specimens from other organs, systems and tissues: Secondary | ICD-10-CM

## 2020-09-30 DIAGNOSIS — I10 Essential (primary) hypertension: Secondary | ICD-10-CM

## 2020-09-30 DIAGNOSIS — E785 Hyperlipidemia, unspecified: Secondary | ICD-10-CM

## 2020-09-30 DIAGNOSIS — I2510 Atherosclerotic heart disease of native coronary artery without angina pectoris: Secondary | ICD-10-CM

## 2020-09-30 DIAGNOSIS — I739 Peripheral vascular disease, unspecified: Secondary | ICD-10-CM

## 2020-09-30 DIAGNOSIS — D649 Anemia, unspecified: Secondary | ICD-10-CM

## 2020-09-30 DIAGNOSIS — E119 Type 2 diabetes mellitus without complications: Secondary | ICD-10-CM

## 2020-09-30 LAB — COMPREHENSIVE METABOLIC PANEL
Lab: 0.3 mg/dL — ABNORMAL HIGH (ref 0.3–1.2)
Lab: 140 MMOL/L — ABNORMAL LOW (ref 137–147)
Lab: 2 mg/dL — ABNORMAL HIGH (ref 0.4–1.00)
Lab: 237 mg/dL — ABNORMAL HIGH (ref 70–100)
Lab: 3.7 g/dL — ABNORMAL LOW (ref 3.5–5.0)
Lab: 4.6 MMOL/L — ABNORMAL LOW (ref 3.5–5.1)
Lab: 6.1 g/dL (ref 6.0–8.0)

## 2020-09-30 LAB — CBC AND DIFF
Lab: 3.4 M/UL — ABNORMAL LOW (ref 4.0–5.0)
Lab: 6 K/UL — ABNORMAL HIGH (ref 4.5–11.0)

## 2020-09-30 LAB — URIC ACID: Lab: 5.9 mg/dL (ref 2.0–7.0)

## 2020-09-30 LAB — URINALYSIS DIPSTICK REFLEX TO CULTURE
Lab: NEGATIVE
Lab: NEGATIVE
Lab: NEGATIVE
Lab: NEGATIVE
Lab: NEGATIVE
Lab: NEGATIVE

## 2020-09-30 LAB — URINALYSIS MICROSCOPIC REFLEX TO CULTURE

## 2020-09-30 LAB — PHOSPHORUS: Lab: 3.9 mg/dL — ABNORMAL HIGH (ref 2.0–4.5)

## 2020-09-30 LAB — ALLOSURE

## 2020-09-30 LAB — MAGNESIUM: Lab: 1.4 mg/dL — ABNORMAL LOW (ref >60)

## 2020-09-30 LAB — PROTEIN/CR RATIO,UR RAN
Lab: 16 mg/dL (ref 25–110)
Lab: 76 mg/dL (ref 7–40)

## 2020-09-30 MED FILL — TACROLIMUS 1 MG PO CAP: 1 mg | ORAL | 30 days supply | Qty: 240 | Fill #1 | Status: CP

## 2020-09-30 NOTE — Patient Instructions
It was a pleasure meeting with you today, if you should have any questions or concerns (including about your medication list) please contact the Kidney Transplant Clinic at [phone].     Please make your return appointment for    Please repeat your labs       For up to date information on the COVID-19 virus, visit the [facility] website. [email or web address]  ? General supportive care during cold and flu season and infection prevention reminders:    o Wash hands often with soap and water for at least 20 seconds   o Cover your mouth and nose   o Social distancing: try to maintain 6 feet between you and other people   o Stay home if sick and symptoms mild or manageable?  ? If you must be around people wear a mask    ? If you are having symptoms of a lower respiratory infection (cough, shortness of breath) and/or fever AND either traveled in last 30 days (internationally or to region of exposure) OR known exposure to patient with COVID19:     o Call your primary care provider for questions or health needs.   ? Tell your doctor about your recent travel, potential exposure and your symptoms     o In a medical emergency, call 911 or go to the nearest emergency room.      In accordance with expert opinions/recommendations by the American Society of Transplantation, the [facility] in review of available literature WILL BE RECOMMENDING all transplanted patients who are AT LEAST 3 MONTHS post transplant obtain the Covid-19 mRNA vaccine who do not have overt contraindications. There are currently two versions of this vaccine from Pfizer and [place] and is recommended to be given in 2 doses.      The CDC, Transplant community and FDA have all recommended a third BOOSTER dose of the COVID-19 vaccine for immunocompromised patients. Since you are a transplant patient on immunosuppression it is recommended that you obtain this 3rd dose of the same vaccine as the prior two doses. You can obtain your 3rd dose at local retail pharmacy, your primary care provider (PCP) or local health department.       The best, most time-efficient way for patients and others to learn the latest status and process is to visit [email or web address] or call our COVID-19 Information Hotline at 913-94-COVID 267-593-3333) for a frequently updated recorded message about vaccine availability and scheduling. We understand patients are eager to receive their vaccines but calling can result in long hold times and reduce our primary care offices' ability to serve patients' other needs. Our primary care offices cannot schedule vaccinations at this time.

## 2020-09-30 NOTE — Progress Notes
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 On PD Jan 2019  DDRT  KPDI 92%, cPRA 0%  Physical Crossmatch B cell negative T cell negative  CMV D -/ R -  Induction: Thymo  Maintenance IS: Dual  IS   PD not removed with surgery   Post-op Complications:  - None    Referring Nephrologist:  Elmer Bales  [address]  [name]   [place] [address]  Phone: [phone]  Fax: [phone]     Dear Dr. Elmer Bales,    We had the pleasure of meeting Jacqueline Shields in the [facility] for routine evaluation of her renal transplant. She was last seen 09/09/2020 and has been doing well since that time. Accompanied by her husband.    Home BPs 130s/80s.     Home glucose readings are 150s-200. She remains on Novolog and Lantus.    She reports strong appetite.    She still has a cough which has subsided but is exacerbated when talking or overexertion with some improvement. She denies DOE, PND. Her COVID and RVP have been negative on previous clinic visit. This cough has been present since transplant. She has not taken any medications or interventions to provide relief. She has made no interventions.       She reports compliance with her immunosuppresion regimen.        REVIEW OF SYSTEMS: Comprehensive 14-point ROS reviewed  Positives noted in HPI otherwise negative.    Past History:  Medical History:   Diagnosis Date   ? Abnormal biopsy of kidney    ? Anemia    ? 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 Diabetes mellitus (HCC)    ? ESRD (end stage renal disease) (HCC)    ? Hypertension    ? Kidney failure    ? MGUS (monoclonal gammopathy of unknown significance)    ? Proteinuria        Surgical History:   Procedure Laterality Date   ? ANGIOGRAPHY CORONARY ARTERY WITH LEFT HEART CATHETERIZATION N/A 07/03/2020    Performed by Harley Alto, MD at [facility] CATH LAB   ? POSSIBLE PERCUTANEOUS CORONARY STENT PLACEMENT WITH ANGIOPLASTY N/A 07/03/2020    Performed by Harley Alto, MD at [facility] CATH LAB   ? ALLOTRANSPLANTATION KIDNEY FROM NON LIVING DONOR WITHOUT RECIPIENT NEPHRECTOMY N/A 07/15/2020    Performed by Nicholos Johns, MD at [facility] OR   ? REMOVAL TUNNELED INTRAPERITONEAL CATHETER Bilateral 08/24/2020    Performed by Nicholos Johns, MD at [facility] OR   ? CYSTOURETHROSCOPY WITH REMOVAL FOREIGN BODY/ CALCULUS/ STENT FROM URETHRA/ BLADDER - SIMPLE Right 08/24/2020    Performed by Marylen Ponto, MD at [facility] OR   ? CATHETER IMPLANT/REVISION      PD cath   ? HX HYSTERECTOMY     ? HX LITHOTRIPSY         Social History     Socioeconomic History   ? Marital status: Married     Spouse name: Not on file   ? Number of children: Not on file   ? Years of education: Not on file   ? Highest education level: Not on file   Occupational History   ?  Not on file   Tobacco Use   ? Smoking status: Never Smoker   ? Smokeless tobacco: Never Used   Vaping Use   ? Vaping Use: Never used   Substance and Sexual Activity   ? Alcohol use: Not Currently   ? Drug use: Never   ? Sexual activity: Not on file   Other Topics Concern   ? Not on file   Social History Narrative   ? Not on file       Family History   Problem Relation Age of Onset   ? Diabetes Mother    ? Hypertension Mother    ? Diabetes Father    ? Hypertension Father    ? Cancer-Hematologic Father    ? Diabetes Sister    ? Migraines Sister    ? Cancer Sister         Endometrial       No Known Allergies    Current Medications:    Current Outpatient Medications:   ?  acetaminophen (TYLENOL) 325 mg tablet, Take one tablet to two tablets by mouth every 6 hours as needed., Disp: 120 tablet, Rfl: 1  ?  acyclovir (ZOVIRAX) 400 mg tablet, Take one tablet by mouth every 12 hours. Indications: infection prophylaxis, medical, Disp: 60 tablet, Rfl: 2  ? aspirin EC (ASPIR-LOW) 81 mg tablet, Take one tablet by mouth daily. Take with food., Disp: 90 tablet, Rfl: 3  ?  calcitrioL (ROCALTROL) 0.25 mcg capsule, Take one capsule by mouth daily., Disp: 90 capsule, Rfl: 3  ?  cholecalciferol (VITAMIN D-3) 50 mcg (2,000 unit) tablet, Take one tablet by mouth daily., Disp: 90 tablet, Rfl: 3  ?  docusate (COLACE) 100 mg capsule, Take one capsule by mouth twice daily., Disp: 180 capsule, Rfl: 0  ?  famotidine (PEPCID) 20 mg tablet, Take one tablet by mouth daily. Indications: stress ulcer prevention, Disp: 30 tablet, Rfl: 5  ?  ferrous sulfate (FEOSOL) 325 mg (65 mg iron) tablet, Take one tablet by mouth at bedtime daily. Take on an empty stomach at least 1 hour before or 2 hours after food., Disp: 30 tablet, Rfl: 3  ?  insulin aspart (U-100) (NOVOLOG FLEXPEN U-100 INSULIN) 100 unit/mL (3 mL) PEN, Follow Novolog mealtime dose taper to match prednisone taper.  Once off of prednisone, continue 5 units with meals and add 0-14 units per correction scale.  Max 84 units/day (Patient taking differently: 20 Units. Follow Novolog mealtime dose taper to match prednisone taper.  Once off of prednisone, continue 5 units with meals and add 0-14 units per correction scale.  Max 84 units/day), Disp: 45 mL, Rfl: 1  ?  insulin glargine (LANTUS SOLOSTAR U-100 INSULIN) 100 unit/mL (3 mL) subcutaneous PEN, Inject 65 Units under the skin twice daily., Disp: 45 mL, Rfl: 1  ?  insulin pen needles (disposable) (BD UF NANO PEN NEEDLES) 32 gauge x 5/32 pen needle, Use one each as directed as Needed. Use with insulin injections., Disp: 300 each, Rfl: 3  ?  mycophenolate DR (MYFORTIC) 180 mg TbEC tablet, Take four tablets by mouth twice daily., Disp: 240 tablet, Rfl: 11  ?  polyethylene glycol 3350 (MIRALAX) 17 gram/dose powder, Take seventeen g by mouth daily., Disp: 527 g, Rfl: 0  ?  rosuvastatin (CRESTOR) 20 mg tablet, Take one tablet by mouth daily., Disp: 90 tablet, Rfl: 1  ?  tacrolimus (PROGRAF) 1 mg capsule, Take four capsules by mouth twice daily., Disp: 240 capsule, Rfl: 11  ?  trimethoprim/sulfamethoxazole (BACTRIM) 80/400 mg tablet, Take one tablet by mouth daily. Indications: infection prophylaxis, medical, Disp: 30 tablet, Rfl: 5    Physical Exam:  Vitals:    09/30/20 1025 09/30/20 1028   BP: 131/76 104/58   BP Source: Arm, Right Upper Arm, Right Upper   Patient Position: Sitting Standing   Pulse: 85 93   Temp: 36.6 ?C (97.9 ?F)    TempSrc: Oral    SpO2: 100%    Weight: 113.7 kg (250 lb 9.6 oz)    Height: 172.7 cm (5' 8)    PainSc: Zero    Body mass index is 38.1 kg/m?Marland Kitchen      General: NAD, A+Ox4, calm and pleasant. Appears to be stated age.   HENT: Unremarkable, no oral lesions, wearing mask  Neck: Normal ROM, no LAD, no JVD  Lungs: Bilat. CTA  CV: RRR, S1, S2 without carotid bruit or murmur, no edema  Abdomen: Soft, N/D, N/T without hepatosplenomegaly,  central obesity with mild pannus  Incision: RLQ Healed  M/S: Normal ROM and strength  Neuro: Nonfocal deficits without tremors  Skin: No skin rash  Psyc: Stable      Laboratory studies:     CMP:  CMP Latest Ref Rng & Units 09/23/2020 09/16/2020 09/09/2020 09/01/2020 08/26/2020   NA 137 - 147 MMOL/L 142 137 140 141 141   K 3.5 - 5.1 MMOL/L 4.9 4.4 4.9 4.6 4.4   CL 98 - 110 MMOL/L 108 104 106 108 107   CO2 21 - 30 MMOL/L 23 23 23 24 24    GAP 3 - 12 11 10 11 9 10    BUN 7 - 25 MG/DL 16(X) 19 24 21  28(H)   CR 0.4 - 1.00 MG/DL 0.96(E) 4.54(U) 9.81(X) 1.67(H) 1.70(H)   GLUX 70 - 100 MG/DL 914(N) 829(F) 621(H) 086(V) 136(H)   CA 8.5 - 10.6 MG/DL 8.8 8.5 9.0 8.5 8.7   TP 6.0 - 8.0 G/DL 6.3 6.3 6.4 6.6 6.2   ALB 3.5 - 5.0 G/DL 3.7 3.9 3.8 4.0 4.0   ALKP 25 - 110 U/L 67 62 68 70 68   ALT 7 - 56 U/L 20 25 23 25 23    TBILI 0.3 - 1.2 MG/DL 0.3 0.4 0.5 0.4 0.4   GFR >60 mL/min - - - - -   GFRAA >60 mL/min - - - - -     Hemoglobin A1C (%)   Date Value   07/15/2020 11.4 (H)   05/05/2020 9.5 (H)   04/30/2019 10.7 (H)     PTH Hormone (PG/ML)   Date Value   07/15/2020 272.3 (H) No results found for: LIPASE  No results found for: AMY  BK Virus Plasma Quant (no units)   Date Value   08/26/2020     NOT DETECTED  Reference range: NOT DETECTED  Unit: copies/mL  TESTING PERFORMED AT LOW VOLUME ON PLASMA SPECIMEN FOR BKV, MAY  AFFECT RESULTS.  Marland Kitchen  Assay Range: 39 copies/mL to 1.00E+10 copies/mL  .  The limit of quantitation (LOQ) is 39 copies/mL. BK virus DNA  detected below the  LOQ will be reported as Detected:<39 copies/mL.  .  This test was developed and its performance characteristics  determined by  [facility]. It has not been cleared or approved by the U.[place].  Food and Drug  Administration. Results should be used in conjunction with clinical  findings,  and should not form the sole basis for a diagnosis or treatment  decision.  ____________________________________________________________  Testing  Performed At:  [facility]  [address]  [place] [address]  Laboratory Director: Lind Guest Ph.D., BCLD (ABB)  CLIA#: [id number]  Phone: [phone]     08/19/2020 BK Virus Not Detected   08/05/2020 BK Virus Not Detected   07/20/2020 BK Virus Not Detected     No results found for: CMVDNAPCR  No results found for: IUMLBLD  No results found for: COPIES  No results found for: EBVDNAQT    TACROLIMUS LEVEL:  No results found for: TACROLIMUS    CBC with Diff:  CBC with Diff Latest Ref Rng & Units 09/30/2020 09/23/2020   WBC 4.5 - 11.0 K/UL 6.0 4.2(L)   RBC 4.0 - 5.0 M/UL 3.43(L) 3.54(L)   HGB 12.0 - 15.0 GM/DL 10.3(L) 10.4(L)   HCT 36 - 45 % 29.9(L) 31.2(L)   MCV 80 - 100 FL 87.1 88.1   MCH 26 - 34 PG 29.9 29.5   MCHC 32.0 - 36.0 G/DL 30.8 65.7   RDW 11 - 15 % 15.3(H) 15.7(H)   PLT 150 - 400 K/UL 279 237   MPV 7 - 11 FL 8.4 8.6   NEUT 41 - 77 % 81(H) 74   ANC 1.8 - 7.0 K/UL 4.89 3.27   LYMA 24 - 44 % 7(L) 8(L)   ALYM 1.0 - 4.8 K/UL 0.41(L) 0.34(L)   MONA 4 - 12 % 10 14(H)   AMONO 0 - 0.80 K/UL 0.60 0.63   EOSA 0 - 5 % 1 3   AEOS 0 - 0.45 K/UL 0.06 0.11   BASA 0 - 2 % 1 1 ABAS 0 - 0.20 K/UL 0.05 0.04     Lab Results   Component Value Date/Time    IRON 54 07/17/2020 06:06 AM    TIBC 224 (L) 07/17/2020 06:06 AM    PSAT 24 (L) 07/17/2020 06:06 AM    FERRITIN 719 (H) 07/17/2020 06:06 AM       Urinalysis:  Lab Results   Component Value Date/Time    UCOLOR STRAW 09/30/2020 10:13 AM    TURBID CLEAR 09/30/2020 10:13 AM    USPGR 1.011 09/30/2020 10:13 AM    UPH 6.0 09/30/2020 10:13 AM    UPROTEIN NEG 09/30/2020 10:13 AM    UAGLU 3+ (A) 09/30/2020 10:13 AM    UKET NEG 09/30/2020 10:13 AM    UBILE NEG 09/30/2020 10:13 AM    UBLD NEG 09/30/2020 10:13 AM    UROB NORMAL 09/30/2020 10:13 AM     Protein/CR ratio (no units)   Date Value   09/23/2020 0.2   09/16/2020 0.2   09/09/2020 0.2   09/01/2020 0.2   08/26/2020 0.2       Imaging:    Results for orders placed during the hospital encounter of 07/29/20    CHEST 2 VIEWS    Impression  No evidence of acute cardiopulmonary disease.      Finalized by Arlana Hove, M.D. on 07/29/2020 12:15 PM. Dictated by Arlana Hove, M.D. on 07/29/2020 12:14 PM.      Assessment and Plan: labs rev'd from 09/01/2020 and labs avail after clinic visit  Ms. 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 S/p DDRT on 07/15/20. cPRA 0%, KDPI 92%   ?  #) Immunosuppression - On maintenance dual drug therapy  - Goal tacrolimus trough first 3 months after transplant is 8-12 mcg/L (HPLC/LCMS). Tac level pending  - Tacrolimus 5 mg bid. MPA 720 mg BID and steroid minimization protocol due  to poorly controled DM and 0 PRA    - prednisone stopped at discharge.   ?  #) Deceased donor renal transplant: [facility]?/ATN  - bl Cr TBD. Cr 2 mg/dl (above baseline) Tac level pending. Minimal proteinuria.  - Transplant Ultrasound 07/15/2020: Unremarkable   - Ureteral stent/PD cath schedule 08/24/2020  - would add ACEI for renal protection when Creatinine stable  - Pt received Evusheld for preventative COVID-19 measures in the immunocompromised patient  - check Allosure and DSA today      #) ID Proph  - CMV Donor(-)/ Recipient(-) to treat with Acyclovir for 3 months post transplant  - PJP Prophylax: To treat with Bactrim/Septra for 6 months post transplant  - Nystatin swish for 2 weeks post transplant      ?#) Dry cough   - continues to improve.  - advised to try conventional measures and report s/s of exacerbation   - likely related to intubation, if persists would refer to ENT for further eval    ?    #) BP - fair control in clinic  - Monitor for now   - Encouraged pt again to resume home monitoring  ?  #) Type II DM - improving control   - Endocrine consulted while inpatient  - Hemoglobin A 1C prior to transplant - 11.4% (07/15/20). Check on repeat labs  - Will benefit from SGLT2 inhibitor outpatient   - Cont Basaglar (recently increased to 70 units bid by patient) and Novolog  ?  #) Anemia - Hgb average of 10-11  - Low iron stores on iron supplements  ?  # CKD BMD/Hypocalcemia    - PTH 273, Vit D 28  - Vit D 2000 units daily  - Cont Calcitriol 0.25 mg daily     #) Health maintenance by PCP   Skin cancer surveillance: Annual follow up with Dermatology for skin eval due to long term immunosuppression   Cancer screening: Mammogram, Pap Smear, colonoscopy per guidelines   Annual high dose flu vaccine    Avoid all live vaccines. When clinically indicated or age appropriate, can receive the Shingrix vaccine one year post transplant.      Fully vaccinated for COVID-19 [Moderna] x 2 doses. 3rd dose at three months post transplant        RTC in 3 weeks  Labs weekly  Renal txp u/s if Tac level therap given increasing Creatinine (as still above baseline)      Sincerely,    Alessandra Bevels, APRN-NP  Transplant Nephrology Nurse Practitioner    Cc: Elmer Bales  Cc: Leron Croak    Please contact the [facility] at [phone] for any transplant related questions or concerns that may arise.      Total time minutes: 40  Reviewed data prior to visit: 10 minutes  Face to face time spent with patient:  20 minutes   After visit time spent on documentation and coordination of care: 10 minutes   Documentation review includes review of past medical history, Nephrology notes, interpretation of laboratory results  Face to face time included counseling and education patient and family/caregiver, examination, communicated laboratory results, care coordination

## 2020-10-01 ENCOUNTER — Encounter: Admit: 2020-10-01 | Discharge: 2020-10-01 | Payer: MEDICARE

## 2020-10-01 MED ORDER — TACROLIMUS 1 MG PO CAP
6 mg | ORAL_CAPSULE | Freq: Two times a day (BID) | ORAL | 11 refills | Status: CN
Start: 2020-10-01 — End: ?

## 2020-10-01 NOTE — Telephone Encounter
Reviewed pt's labs from 09/30/20 with Dr. Earney Hamburg. Creatinine 2.0. FK subtherapeutic at 6.1. Will increase Prograf from 4/4 to 6/6. Called pt with medication dose change, pt read back instructions correctly. Pt will repeat labs in one week.

## 2020-10-06 ENCOUNTER — Encounter: Admit: 2020-10-06 | Discharge: 2020-10-06 | Payer: MEDICARE

## 2020-10-07 ENCOUNTER — Encounter: Admit: 2020-10-07 | Discharge: 2020-10-07 | Payer: MEDICARE

## 2020-10-07 ENCOUNTER — Ambulatory Visit: Admit: 2020-10-07 | Discharge: 2020-10-07 | Payer: MEDICARE

## 2020-10-07 DIAGNOSIS — Z940 Kidney transplant status: Secondary | ICD-10-CM

## 2020-10-07 DIAGNOSIS — Z79899 Other long term (current) drug therapy: Secondary | ICD-10-CM

## 2020-10-07 DIAGNOSIS — D849 Immunodeficiency, unspecified: Secondary | ICD-10-CM

## 2020-10-07 LAB — URINALYSIS MICROSCOPIC REFLEX TO CULTURE

## 2020-10-07 LAB — COMPREHENSIVE METABOLIC PANEL
Lab: 0.5 mg/dL — ABNORMAL HIGH (ref 0.3–1.2)
Lab: 10 K/UL — ABNORMAL LOW (ref 3–12)
Lab: 105 MMOL/L — ABNORMAL LOW (ref 98–110)
Lab: 139 MMOL/L — ABNORMAL LOW (ref 137–147)
Lab: 17 U/L (ref 7–56)
Lab: 2.1 mg/dL — ABNORMAL HIGH (ref 0.4–1.00)
Lab: 24 MMOL/L (ref 21–30)
Lab: 254 mg/dL — ABNORMAL HIGH (ref 70–100)
Lab: 3.9 g/dL — ABNORMAL LOW (ref 3.5–5.0)
Lab: 6.4 g/dL (ref 6.0–8.0)

## 2020-10-07 LAB — CBC AND DIFF
Lab: 0 K/UL (ref 0–0.20)
Lab: 0 K/UL (ref 0–0.45)
Lab: 0.6 K/UL — ABNORMAL LOW (ref >60)
Lab: 1 % (ref 0–5)
Lab: 11 % (ref 4–12)
Lab: 3.6 M/UL — ABNORMAL LOW (ref 4.0–5.0)
Lab: 31 % — ABNORMAL LOW (ref 36–45)
Lab: 6 K/UL (ref 4.5–11.0)

## 2020-10-07 LAB — URINALYSIS DIPSTICK REFLEX TO CULTURE
Lab: 1 /HPF (ref 1.005–1.030)
Lab: 5 /HPF (ref 5.0–8.0)
Lab: NEGATIVE
Lab: NEGATIVE
Lab: NEGATIVE
Lab: NEGATIVE
Lab: NEGATIVE
Lab: NEGATIVE

## 2020-10-07 LAB — URIC ACID: Lab: 6.1 mg/dL (ref 2.0–7.0)

## 2020-10-07 LAB — PROTEIN/CR RATIO,UR RAN
Lab: 20 mg/dL
Lab: 86 mg/dL

## 2020-10-07 LAB — PHOSPHORUS: Lab: 3.5 mg/dL — ABNORMAL HIGH (ref 2.0–4.5)

## 2020-10-07 MED FILL — PEN NEEDLE, DIABETIC 32 GAUGE X 5/32" MISC NDLE: 32 gauge x 5/" | 60 days supply | Qty: 300 | Fill #2 | Status: AC

## 2020-10-07 NOTE — Telephone Encounter
Reviewed pt's labs from 10/07/20 with Dr. Earney Hamburg. Creatinine uptrending at 2.14. FK pending. DSA/Allosure from 09/30/20 both neg. Pt to f/u with [facility], scheduled for Friday, 3/11. Called to discuss with pt who verbalized understanding.

## 2020-10-09 ENCOUNTER — Encounter: Admit: 2020-10-09 | Discharge: 2020-10-09 | Payer: MEDICARE

## 2020-10-09 ENCOUNTER — Ambulatory Visit: Admit: 2020-10-09 | Discharge: 2020-10-09 | Payer: MEDICARE

## 2020-10-09 DIAGNOSIS — Z940 Kidney transplant status: Secondary | ICD-10-CM

## 2020-10-14 ENCOUNTER — Encounter: Admit: 2020-10-14 | Discharge: 2020-10-14 | Payer: MEDICARE

## 2020-10-14 ENCOUNTER — Ambulatory Visit: Admit: 2020-10-14 | Discharge: 2020-10-14 | Payer: MEDICARE

## 2020-10-14 DIAGNOSIS — Z940 Kidney transplant status: Secondary | ICD-10-CM

## 2020-10-14 DIAGNOSIS — E119 Type 2 diabetes mellitus without complications: Secondary | ICD-10-CM

## 2020-10-14 DIAGNOSIS — N186 End stage renal disease: Secondary | ICD-10-CM

## 2020-10-14 DIAGNOSIS — E0821 Diabetes mellitus due to underlying condition with diabetic nephropathy: Secondary | ICD-10-CM

## 2020-10-14 DIAGNOSIS — D509 Iron deficiency anemia, unspecified: Secondary | ICD-10-CM

## 2020-10-14 DIAGNOSIS — N19 Unspecified kidney failure: Secondary | ICD-10-CM

## 2020-10-14 DIAGNOSIS — D849 Immunodeficiency, unspecified: Secondary | ICD-10-CM

## 2020-10-14 DIAGNOSIS — N2581 Secondary hyperparathyroidism of renal origin: Secondary | ICD-10-CM

## 2020-10-14 DIAGNOSIS — I2510 Atherosclerotic heart disease of native coronary artery without angina pectoris: Secondary | ICD-10-CM

## 2020-10-14 DIAGNOSIS — E559 Vitamin D deficiency, unspecified: Secondary | ICD-10-CM

## 2020-10-14 DIAGNOSIS — D649 Anemia, unspecified: Secondary | ICD-10-CM

## 2020-10-14 DIAGNOSIS — Z9189 Other specified personal risk factors, not elsewhere classified: Secondary | ICD-10-CM

## 2020-10-14 DIAGNOSIS — Z79899 Other long term (current) drug therapy: Secondary | ICD-10-CM

## 2020-10-14 DIAGNOSIS — E785 Hyperlipidemia, unspecified: Secondary | ICD-10-CM

## 2020-10-14 DIAGNOSIS — I10 Essential (primary) hypertension: Secondary | ICD-10-CM

## 2020-10-14 DIAGNOSIS — D472 Monoclonal gammopathy: Secondary | ICD-10-CM

## 2020-10-14 DIAGNOSIS — R809 Proteinuria, unspecified: Secondary | ICD-10-CM

## 2020-10-14 DIAGNOSIS — R897 Abnormal histological findings in specimens from other organs, systems and tissues: Secondary | ICD-10-CM

## 2020-10-14 LAB — URINALYSIS DIPSTICK REFLEX TO CULTURE
Lab: NEGATIVE
Lab: NEGATIVE
Lab: NEGATIVE
Lab: NEGATIVE

## 2020-10-14 LAB — PROTEIN/CR RATIO,UR RAN
Lab: 0.2 K/UL (ref 0–0.45)
Lab: 105 mg/dL — ABNORMAL LOW (ref >60)
Lab: 24 mg/dL — AB (ref 1.005–1.030)

## 2020-10-14 LAB — URIC ACID: Lab: 6.4 mg/dL (ref 2.0–7.0)

## 2020-10-14 LAB — MAGNESIUM: Lab: 1.4 mg/dL — ABNORMAL LOW (ref 1.6–2.6)

## 2020-10-14 LAB — COMPREHENSIVE METABOLIC PANEL
Lab: 139 MMOL/L — ABNORMAL LOW (ref 137–147)
Lab: 2.5 mg/dL — ABNORMAL HIGH (ref 0.4–1.00)
Lab: 205 mg/dL — ABNORMAL HIGH (ref 70–100)
Lab: 3.8 g/dL — ABNORMAL LOW (ref 3.5–5.0)
Lab: 4.3 MMOL/L — ABNORMAL LOW (ref 3.5–5.1)
Lab: 6.3 g/dL (ref 6.0–8.0)

## 2020-10-14 LAB — IRON + BINDING CAPACITY + %SAT+ FERRITIN
Lab: 273 ug/dL (ref 270–380)
Lab: 58 ug/dL (ref 50–160)
Lab: 699 ng/mL — ABNORMAL HIGH (ref 10–200)

## 2020-10-14 LAB — LIPID PROFILE
Lab: 122 mg/dL (ref <200)
Lab: 242 mg/dL — ABNORMAL HIGH (ref <150)
Lab: 52 mg/dL (ref <100)

## 2020-10-14 LAB — PARATHYROID HORMONE: Lab: 96 pg/mL — ABNORMAL HIGH (ref 10–65)

## 2020-10-14 LAB — CBC AND DIFF
Lab: 3.6 M/UL — ABNORMAL LOW (ref 4.0–5.0)
Lab: 6 K/UL (ref 4.5–11.0)

## 2020-10-14 LAB — URINALYSIS MICROSCOPIC REFLEX TO CULTURE

## 2020-10-14 LAB — ALLOSURE

## 2020-10-14 LAB — PHOSPHORUS: Lab: 4.1 mg/dL — ABNORMAL HIGH (ref 2.0–4.5)

## 2020-10-14 LAB — TRANSFERRIN: Lab: 183 mg/dL — ABNORMAL LOW (ref 185–336)

## 2020-10-14 LAB — HEMOGLOBIN A1C: Lab: 10 % — ABNORMAL HIGH (ref 4.0–6.0)

## 2020-10-14 LAB — 25-OH VITAMIN D (D2 + D3): Lab: 28 ng/mL — ABNORMAL LOW (ref 30–80)

## 2020-10-14 LAB — TACROLIMUS LC-MS/MS: Lab: 13 mg/dL — ABNORMAL HIGH (ref 0.3–1.2)

## 2020-10-14 MED ORDER — LEVOFLOXACIN 250 MG PO TAB
ORAL_TABLET | Freq: Every day | ORAL | 0 refills | 7.00000 days | Status: AC
Start: 2020-10-14 — End: ?

## 2020-10-14 MED FILL — FAMOTIDINE 20 MG PO TAB: 20 mg | ORAL | 30 days supply | Qty: 30 | Fill #4 | Status: CP

## 2020-10-14 MED FILL — SULFAMETHOXAZOLE-TRIMETHOPRIM 400-80 MG PO TAB: 80/400 mg | ORAL | 30 days supply | Qty: 30 | Fill #4 | Status: CP

## 2020-10-14 MED FILL — MYCOPHENOLATE SODIUM 180 MG PO TBEC: 180 mg | ORAL | 30 days supply | Qty: 240 | Fill #4 | Status: CP

## 2020-10-14 NOTE — Telephone Encounter
Reviewed pt's labs from 3/16 with Dr. Earney Hamburg. Creatinine continues to trend up at 2.56.  Will treat possible UTI with levaquin 500mg  x1 then 250mg  daily x7 days. IR fluid aspiration scheduled for Friday, 3/18. Pt will repeat labs Monday, 3/21. Based on lab results on Monday, will plan for IR renal tx biopsy.   Called pt with plan as above. Pt had return to work date as 10/19/20, will extended to 10/26/20.

## 2020-10-15 ENCOUNTER — Encounter: Admit: 2020-10-15 | Discharge: 2020-10-15 | Payer: MEDICARE

## 2020-10-15 DIAGNOSIS — Z940 Kidney transplant status: Secondary | ICD-10-CM

## 2020-10-15 MED ORDER — TACROLIMUS 1 MG PO CAP
4 mg | ORAL_CAPSULE | Freq: Two times a day (BID) | ORAL | 11 refills | Status: CN
Start: 2020-10-15 — End: ?

## 2020-10-15 NOTE — Telephone Encounter
Reviewed pt's labs from 10/14/20. Creatinine 2.53. FK supratherapeutic at 13.4. Will decrease Prograf from 6/6 to 4/4. Called pt with medication dose change, pt read back instructions correctly. Pt will repeat labs on Monday, 3/21.

## 2020-10-15 NOTE — Telephone Encounter
-----   Message from Fabio Neighbors, MD sent at 10/15/2020 11:08 AM CDT -----  4/4  ----- Message -----  From: Burnis Kingfisher, RN  Sent: 10/15/2020   8:22 AM CDT  To: Fabio Neighbors, MD    FK 13.4 on Prograf 6/6

## 2020-10-15 NOTE — Patient Education
Dear Ms. Jacqueline Shields,     Thank you for choosing [facility] Interventional Radiology for your procedure. Your appointment information is listed below:     Appointment Date: 10/16/20  Appointment Time: 11AM                   Arrival Time:   10AM                     Location:      Main Campus: [address], [place], [place]  [address]  Parking: [address] Parking Garage/ go to main entrance and take elevators to second floor to Interventional Radiology to get registered     INTERVENTIONAL RADIOLOGY  PRE-PROCEDURE INSTRUCTIONS SEDATION     You are scheduled for a procedure in Interventional Radiology with procedural sedation.  Please follow these instructions and any direction from your Primary Care/Managing Physician.  If you have questions about your procedure or need to reschedule please call [phone].         Medication Instructions:   You may take the following medications with a small sip of water:  You may take your regular medications as directed.      Diet Instructions:      a.  (8) hours (3AM) before your procedure, stop your regular diet and start a clear liquid diet.  b. (6) hours before your procedure, discontinue tube feedings and chewing tobacco.  c. (2) hours (9AM)  before your procedure discontinue clear liquids.  You should have nothing by mouth. This includes GUM or CANDY.        Clear Liquid Diet     Water                 Apple or White Grape Juice        Coffee or tea without cream   Tea                    White Cranberry Juice                 Chicken Bouillon or Broth (no noodles)  Soda Pop           Popsicles                                     Beef Bouillon or Broth (no noodles)     Day of Exam Instructions:  1. Bathe or shower with an antibacterial soap prior to your appointment.  2. If you have a history of Obstructive Sleep Apnea (OSA) bring your CPAP/BIPAP.   3. Bring a list of your current medications and the dosages.  4. Wear comfortable clothing and leave valuables at home.  5. Arrive (1) hour prior to your appointment.  This time will be spent registering, interviewing, assessing, educating and preparing you for the test.  ? You will be with [place] anywhere from 30 minutes to 6 hours after your exam depending on your procedure.  1. You may be sedated for the procedure. A responsible adult must drive you home (no Benedetto Goad, taxis or buses are allowed) and stay with you overnight. If you do not have a driver we will be unable to perform your procedure.   You will not be able to return to work or drive the same day if receiving sedation.

## 2020-10-15 NOTE — Progress Notes
Interventional Radiology Outpatient Procedure Review Assessment        Reason for consult: Evaluate for transplant renal biopsy in setting of transplant.      History of present illness:  Jacqueline Shields is a 55 y.o. female patient with a history significant for transplant kidney.      Assessment:   - Review of imaging: [place] 10/09/2020.   - Review of recent labs and allergies: None. If none available, is this information necessary prior to procedure: Plt 246 (10/14/2020) PT/INR pending.  - Review of anticoagulation medications: None  - Previous ASA score if available: 3 (If ASA IV, anesthesia will run sedation for procedure if sedation indicated).  - Previous IR Anesthetic/Sedation History (if applicable): None  - Case discussed with referring Physician (Y/N): N  - Contraindications to procedure: None      Plan:  - This case has been reviewed and approved for transplant kidney biopsy  - Patient position and modality: [place] supine  - Intra-procedural Sedation/Medication Plan: Moderate sedation  - Procedure time frame: <10 days, 10-14 days, 14-30 days  - Anticoagulation: Per IR peri procedural anticoagulation management protocol  - Labs: Per IR pre procedural lab protocol  - Additional requests after review: None    Lab/Radiology/Other Diagnostic Tests:  Labs:  Pertinent labs reviewed             Bettey Costa, DO

## 2020-10-16 ENCOUNTER — Encounter: Admit: 2020-10-16 | Discharge: 2020-10-16 | Payer: MEDICARE

## 2020-10-16 ENCOUNTER — Ambulatory Visit: Admit: 2020-10-16 | Discharge: 2020-10-16 | Payer: MEDICARE

## 2020-10-16 DIAGNOSIS — Z940 Kidney transplant status: Secondary | ICD-10-CM

## 2020-10-16 NOTE — Progress Notes
Per Dr. Tina Griffiths no fluid surrounding kidney at this time. Procedure canceled.

## 2020-10-16 NOTE — H&P (View-Only)
IR Pre-Procedure History and Physical/Sedation Plan    Procedure Date: 10/16/2020     Planned Procedure(s):  US guided aspiration of peri-transplant fluid collection    Indication:  Elevated creatinine in transplant  __________________________________________________________________    Chief Complaint:  As above    History of Present Illness: Jacqueline Shields is a 55 y.o. female with a history as listed below who presents today for procedure.    Patient Active Problem List    Diagnosis Date Noted   ? Dyslipidemia 08/03/2020   ? ATN (acute tubular necrosis) (HCC) 07/30/2020   ? Obesity 07/15/2020   ? 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 Immunosuppression (HCC) 07/15/2020   ? 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 PVD (peripheral vascular disease) (HCC) 06/15/2020   ? 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 Hypertension    ? ESRD (end stage renal disease) (HCC)    ? Diabetes mellitus [facility])      Medical History:   Diagnosis Date   ? Abnormal biopsy of kidney    ? Anemia    ? 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 Diabetes mellitus (HCC)    ? ESRD (end stage renal disease) (HCC)    ? Hypertension    ? Kidney failure    ? MGUS (monoclonal gammopathy of unknown significance)    ? Proteinuria       Surgical History:   Procedure Laterality Date   ? ANGIOGRAPHY CORONARY ARTERY WITH LEFT HEART CATHETERIZATION N/A 07/03/2020    Performed by Harley Alto, MD at [facility] CATH LAB   ? POSSIBLE PERCUTANEOUS CORONARY STENT PLACEMENT WITH ANGIOPLASTY N/A 07/03/2020    Performed by Harley Alto, MD at [facility] CATH LAB   ? ALLOTRANSPLANTATION KIDNEY FROM NON LIVING DONOR WITHOUT RECIPIENT NEPHRECTOMY N/A 07/15/2020    Performed by Nicholos Johns, MD at [facility] OR   ? REMOVAL TUNNELED INTRAPERITONEAL CATHETER Bilateral 08/24/2020    Performed by Nicholos Johns, MD at [facility] OR   ? CYSTOURETHROSCOPY WITH REMOVAL FOREIGN BODY/ CALCULUS/ STENT FROM URETHRA/ BLADDER - SIMPLE Right 08/24/2020    Performed by Marylen Ponto, MD at [facility] OR   ? CATHETER IMPLANT/REVISION      PD cath   ? HX HYSTERECTOMY     ? HX LITHOTRIPSY        Social History     Tobacco Use   ? Smoking status: Never Smoker   ? Smokeless tobacco: Never Used   Substance Use Topics   ? Alcohol use: Not Currently      Family History   Problem Relation Age of Onset   ? Diabetes Mother    ? Hypertension Mother    ? Diabetes Father    ? Hypertension Father    ? Cancer-Hematologic Father    ? Diabetes Sister    ? Migraines Sister    ? Cancer Sister         Endometrial      Medications Prior to Admission   Medication Sig Dispense Refill Last Dose   ? acetaminophen (TYLENOL) 325 mg tablet Take one tablet to two tablets by mouth every 6 hours as needed. 120 tablet 1    ? aspirin EC (ASPIR-LOW) 81 mg tablet Take one tablet by mouth daily. Take with food. 90 tablet 3    ? calcitrioL (ROCALTROL) 0.25 mcg capsule Take one capsule by mouth daily. 90 capsule 3    ?  cholecalciferol (VITAMIN D-3) 50 mcg (2,000 unit) tablet Take one tablet by mouth daily. 90 tablet 3    ? docusate (COLACE) 100 mg capsule Take one capsule by mouth twice daily. 180 capsule 0    ? famotidine (PEPCID) 20 mg tablet Take one tablet by mouth daily. Indications: stress ulcer prevention 30 tablet 5    ? ferrous sulfate (FEOSOL) 325 mg (65 mg iron) tablet Take one tablet by mouth at bedtime daily. Take on an empty stomach at least 1 hour before or 2 hours after food. 30 tablet 3    ? insulin aspart (U-100) (NOVOLOG FLEXPEN U-100 INSULIN) 100 unit/mL (3 mL) PEN Follow Novolog mealtime dose taper to match prednisone taper.  Once off of prednisone, continue 5 units with meals and add 0-14 units per correction scale.  Max 84 units/day (Patient taking differently: 20 Units. Follow Novolog mealtime dose taper to match prednisone taper.  Once off of prednisone, continue 5 units with meals and add 0-14 units per correction scale.  Max 84 units/day) 45 mL 1    ? insulin glargine (LANTUS SOLOSTAR U-100 INSULIN) 100 unit/mL (3 mL) subcutaneous PEN Inject 65 Units under the skin twice daily. (Patient taking differently: Inject 75 Units under the skin twice daily.) 45 mL 1    ? insulin pen needles (disposable) (BD UF NANO PEN NEEDLES) 32 gauge x 5/32 pen needle Use one each as directed as Needed. Use with insulin injections. 300 each 3    ? levoFLOXacin (LEVAQUIN) 250 mg tablet Take 2 tabs today then one tab every day for 7 days. 8 tablet 0    ? mycophenolate DR (MYFORTIC) 180 mg TbEC tablet Take four tablets by mouth twice daily. 240 tablet 11    ? polyethylene glycol 3350 (MIRALAX) 17 gram/dose powder Take seventeen g by mouth daily. 527 g 0    ? rosuvastatin (CRESTOR) 20 mg tablet Take one tablet by mouth daily. 90 tablet 1    ? tacrolimus (PROGRAF) 1 mg capsule Take four capsules by mouth twice daily. 240 capsule 11    ? trimethoprim/sulfamethoxazole (BACTRIM) 80/400 mg tablet Take one tablet by mouth daily. Indications: infection prophylaxis, medical 30 tablet 5      No Known Allergies    Review of Systems  A comprehensive review of systems was negative.    Physical Exam:  Vital Signs: Last Filed In 24 Hours Vital Signs: 24 Hour Range                General appearance: Alert and no distress noted.  Neurologic: Grossly normal.  Lungs: Non labored at rest  Heart: Regular rate and rhythm  Abdomen: Non-distended    Pre-procedure anxiolysis plan: N/A  Sedation/Medication Plan: Local anesthetic  Personal history of sedation complications: Denies adverse event.   Family history of sedation complications: Denies adverse event.   Medications for Reversal: NA  Discussion/Reviews:  Physician has discussed risks and alternatives of this type of sedation and above planned procedures with patient    NPO Status: NA  Airway:  NA  Head and Neck: NA  Mouth: NA   Anesthesia Classification:  ASA III (A patient with a severe systemic disease that limits activity, but is not incapacitating)  Pregnancy Status: Not Pregnant    Lab/Radiology/Other Diagnostic Tests:  Labs:  Pertinent labs reviewed           Wesley Blas, APRN-NP  Pager [phone]

## 2020-10-16 NOTE — Other
Immediate Post Procedure Note    Date:  10/16/2020                                         Attending Physician:   Tina Griffiths  Performing Provider:  Sandrea Hammond, MD    Consent:  Consent obtained from patient.  Time out performed: Consent obtained, correct patient verified, correct procedure verified, correct site verified, patient marked as necessary.  Pre/Post Procedure Diagnosis:  Transplant fluid collection  Indications:  same      Procedure(s):  US  Findings:  Interval resolution of small fluid collection posterior to transplant, aspiration aborted     Estimated Blood Loss:  None/Negligible  Specimen(s) Removed/Disposition:  None  Complications: None  Patient Tolerated Procedure: Well  Post-Procedure Condition:  stable    Sandrea Hammond, MD  Pager [phone]

## 2020-10-16 NOTE — Patient Instructions
INTERVENTIONAL RADIOLOGY DISCHARGE INSTRUCTIONS AT THE [facility]  FLUID ASPIRATION?  This procedure is done to remove fluid that has accumulated abnormally. In some cases, a fluid collection may form that is not infected but that may need to be drained to alleviate pressure or pain on surrounding tissues. In this procedure, the Interventional Radiologist carefully places a small needle into the fluid collection using CT or ultrasound guidance.? The fluid is then aspirated or drawn off and sent to the lab for testing.?  AFTER THE PROCEDURE:   ? You will recover in Interventional Radiology for a minimum of 30 minutes after your procedure.  ? You will be on bedrest with bathroom privileges during this time.?  POST-PROCEDURE PAIN:   ? Pain control following your procedure is a priority for both you and your Physicians.  ? Some soreness or tenderness at the site is to be expected for several days. We recommend taking over the counter analgesics to help relieve this pain.  ? Alternative methods for pain relief include but not limited to heat or cold compress, relaxation techniques, rest, and changing of positions.?  ? If pain continues after 5-7 days or you have severe pain not relieved by medication, please contact us as directed below.?  POST-PROCEDURE ACTIVITY:   ? A responsible adult must drive you home.  ? If you receive sedation, narcotic pain medication or anesthesia for the procedure, you should not drive or operate heavy machinery or do anything that requires concentration for at least 24 hours after procedure completion.  ? It is recommended that a responsible adult be with you until morning.  ? Other activity restrictions such as lifting restrictions will depend upon the area aspirated and will be determined by the physician after your procedure.? ? ? ? ? ? ??  POST-PROCEDURE SITE CARE:   ? You will have a small bandage over the procedure site.? Keep this dry.?  ? You may remove it in 24 hours.  ? You may shower in 24 hours, after removing the bandage.  ? Do not submerge the procedure site for 1 week (no bathtub, swimming, hot tub, etc.)  ? Do not use ointments, creams or powders on the puncture site.  ? Be sure your hands are clean when touching near the site.?  DIET/MEDICATIONS:   ? You may resume your previous diet after the procedure.  ? If you receive sedation or anesthesia for the procedure, avoid any foods or beverages containing alcohol for at least 24 hours.  ? Please see the Medication Reconciliation sheet for instructions regarding resuming your home medications.??  CALL THE DOCTOR IF:   ? Bright red blood soaks the bandage.  ? You have pain not relieved by medication.? Some soreness at the site is to be expected.  ? You have signs of infection such as: fever greater than 101F, chills, redness, warmth, swelling, drainage or pus from the puncture site.  ?  For any of the above symptoms or for problems or concerns related to the procedure, call?[phone] Monday-Friday from 7-5p.? After-hours and weekends, please call?[phone] and ask for the Interventional Radiology Resident on-call.  YOU OR YOUR CAREGIVER SHOULD CALL 911 FOR ANY SEVERE SYMPTOMS SUCH AS EXCESSIVE BLEEDING, SEVERE DIZZINESS, TROUBLE BREATHING OR LOSS OF CONSCIOUSNESS.  ?  ?

## 2020-10-19 ENCOUNTER — Encounter: Admit: 2020-10-19 | Discharge: 2020-10-19 | Payer: MEDICARE

## 2020-10-19 ENCOUNTER — Ambulatory Visit: Admit: 2020-10-19 | Discharge: 2020-10-19 | Payer: MEDICARE

## 2020-10-19 DIAGNOSIS — D849 Immunodeficiency, unspecified: Secondary | ICD-10-CM

## 2020-10-19 DIAGNOSIS — Z940 Kidney transplant status: Secondary | ICD-10-CM

## 2020-10-19 DIAGNOSIS — Z79899 Other long term (current) drug therapy: Secondary | ICD-10-CM

## 2020-10-19 LAB — MAGNESIUM: Lab: 1.5 mg/dL — ABNORMAL LOW (ref 1.6–2.6)

## 2020-10-19 LAB — CBC AND DIFF
Lab: 0 K/UL (ref 0–0.20)
Lab: 0 K/UL (ref 0–0.45)
Lab: 0.3 K/UL — ABNORMAL LOW (ref 1.0–4.8)
Lab: 0.5 K/UL (ref 0–0.80)
Lab: 1 % (ref 0–2)
Lab: 1 % — ABNORMAL LOW (ref >60)
Lab: 15 % (ref 11–15)
Lab: 239 K/UL (ref 150–400)
Lab: 29 pg (ref 26–34)
Lab: 3.7 M/UL — ABNORMAL LOW (ref 4.0–5.0)
Lab: 33 g/dL (ref 32.0–36.0)
Lab: 5 % — ABNORMAL LOW (ref 24–44)
Lab: 6.5 K/UL (ref 1.8–7.0)
Lab: 7 % — ABNORMAL HIGH (ref 4–12)
Lab: 7.5 K/UL (ref 4.5–11.0)
Lab: 86 % — ABNORMAL HIGH (ref 41–77)
Lab: 87 FL (ref 80–100)
Lab: 9.2 FL (ref 7–11)

## 2020-10-19 LAB — COMPREHENSIVE METABOLIC PANEL
Lab: 137 MMOL/L (ref 137–147)
Lab: 4.8 MMOL/L (ref 3.5–5.1)

## 2020-10-19 LAB — URINALYSIS MICROSCOPIC REFLEX TO CULTURE

## 2020-10-19 LAB — URINALYSIS DIPSTICK REFLEX TO CULTURE
Lab: 6 (ref 5.0–8.0)
Lab: NEGATIVE

## 2020-10-19 LAB — PROTIME INR (PT)
Lab: 1 % — ABNORMAL LOW (ref 0.8–1.2)
Lab: 11 s — ABNORMAL LOW (ref 8.5–14.4)

## 2020-10-19 LAB — PROTEIN/CR RATIO,UR RAN
Lab: 24 mg/dL
Lab: 76 mg/dL — AB

## 2020-10-19 LAB — PHOSPHORUS: Lab: 3.4 mg/dL (ref 2.0–4.5)

## 2020-10-19 LAB — URIC ACID: Lab: 5.2 mg/dL (ref 2.0–7.0)

## 2020-10-19 NOTE — Telephone Encounter
Reviewed IR procedure- fluid collection resolves, no aspiration performed. Pt completed repeat labs today, 10/19/20. Creatinine 2.0. Discussed with Dr. Earney Hamburg and will complete IR renal tx biopsy this week. Notifed IR scheduling. Called pt to discuss procedure, no questions or concerns at this time.

## 2020-10-21 ENCOUNTER — Encounter: Admit: 2020-10-21 | Discharge: 2020-10-21 | Payer: MEDICARE

## 2020-10-21 DIAGNOSIS — N186 End stage renal disease: Secondary | ICD-10-CM

## 2020-10-21 DIAGNOSIS — R809 Proteinuria, unspecified: Secondary | ICD-10-CM

## 2020-10-21 DIAGNOSIS — Z9889 Other specified postprocedural states: Secondary | ICD-10-CM

## 2020-10-21 DIAGNOSIS — I2510 Atherosclerotic heart disease of native coronary artery without angina pectoris: Secondary | ICD-10-CM

## 2020-10-21 DIAGNOSIS — Z940 Kidney transplant status: Secondary | ICD-10-CM

## 2020-10-21 DIAGNOSIS — N19 Unspecified kidney failure: Secondary | ICD-10-CM

## 2020-10-21 DIAGNOSIS — D472 Monoclonal gammopathy: Secondary | ICD-10-CM

## 2020-10-21 DIAGNOSIS — E119 Type 2 diabetes mellitus without complications: Secondary | ICD-10-CM

## 2020-10-21 DIAGNOSIS — D649 Anemia, unspecified: Secondary | ICD-10-CM

## 2020-10-21 DIAGNOSIS — I10 Essential (primary) hypertension: Secondary | ICD-10-CM

## 2020-10-21 DIAGNOSIS — R897 Abnormal histological findings in specimens from other organs, systems and tissues: Secondary | ICD-10-CM

## 2020-10-21 LAB — CBC
Lab: 10 g/dL — ABNORMAL LOW (ref 12.0–15.0)
Lab: 11 K/UL — ABNORMAL HIGH (ref 4.5–11.0)
Lab: 15 % — ABNORMAL HIGH (ref 11–15)
Lab: 202 K/UL (ref 150–400)
Lab: 29 pg (ref 26–34)
Lab: 3.5 M/UL — ABNORMAL LOW (ref 4.0–5.0)
Lab: 31 % — ABNORMAL LOW (ref 36–45)
Lab: 33 g/dL (ref 32.0–36.0)
Lab: 88 FL (ref 80–100)
Lab: 9 FL (ref 7–11)

## 2020-10-21 LAB — POC GLUCOSE
Lab: 213 mg/dL — ABNORMAL HIGH (ref 70–100)
Lab: 302 mg/dL — ABNORMAL HIGH (ref 70–100)
Lab: 416 mg/dL — ABNORMAL HIGH (ref 70–100)
Lab: 386 mg/dL — ABNORMAL HIGH (ref 70–100)

## 2020-10-21 MED ORDER — MYCOPHENOLATE SODIUM 360 MG PO TBEC
720 mg | Freq: Two times a day (BID) | ORAL | 0 refills | Status: AC
Start: 2020-10-21 — End: ?
  Administered 2020-10-22 (×2): 720 mg via ORAL

## 2020-10-21 MED ORDER — ONDANSETRON HCL (PF) 4 MG/2 ML IJ SOLN
4 mg | Freq: Once | INTRAVENOUS | 0 refills | Status: CP
Start: 2020-10-21 — End: ?
  Administered 2020-10-21: 15:00:00 4 mg via INTRAVENOUS

## 2020-10-21 MED ORDER — ACETAMINOPHEN 325 MG PO TAB
650 mg | ORAL | 0 refills | Status: AC | PRN
Start: 2020-10-21 — End: ?

## 2020-10-21 MED ORDER — TACROLIMUS 1 MG PO CAP
4 mg | Freq: Two times a day (BID) | ORAL | 0 refills | Status: AC
Start: 2020-10-21 — End: ?
  Administered 2020-10-21 – 2020-10-22 (×2): 4 mg via ORAL

## 2020-10-21 MED ORDER — INSULIN ASPART 100 UNIT/ML SC FLEXPEN
0-12 [IU] | Freq: Before meals | SUBCUTANEOUS | 0 refills | Status: AC
Start: 2020-10-21 — End: ?
  Administered 2020-10-21: 22:00:00 8 [IU] via SUBCUTANEOUS

## 2020-10-21 MED ORDER — PROCHLORPERAZINE EDISYLATE 5 MG/ML IJ SOLN
10 mg | INTRAVENOUS | 0 refills | Status: AC | PRN
Start: 2020-10-21 — End: ?
  Administered 2020-10-21: 20:00:00 10 mg via INTRAVENOUS

## 2020-10-21 MED ORDER — CHOLECALCIFEROL (VITAMIN D3) 25 MCG (1,000 UNIT) PO TAB
2000 [IU] | Freq: Every day | ORAL | 0 refills | Status: AC
Start: 2020-10-21 — End: ?
  Administered 2020-10-21 – 2020-10-22 (×2): 2000 [IU] via ORAL

## 2020-10-21 MED ORDER — MELATONIN 5 MG PO TAB
5 mg | Freq: Every evening | ORAL | 0 refills | Status: AC | PRN
Start: 2020-10-21 — End: ?

## 2020-10-21 MED ORDER — CALCITRIOL 0.25 MCG PO CAP
0.25 ug | Freq: Every day | ORAL | 0 refills | Status: AC
Start: 2020-10-21 — End: ?
  Administered 2020-10-22: 14:00:00 0.25 ug via ORAL

## 2020-10-21 MED ORDER — POLYETHYLENE GLYCOL 3350 17 GRAM PO PWPK
1 | Freq: Every day | ORAL | 0 refills | Status: AC | PRN
Start: 2020-10-21 — End: ?

## 2020-10-21 MED ORDER — ROSUVASTATIN 5 MG PO TAB
20 mg | Freq: Every day | ORAL | 0 refills | Status: AC
Start: 2020-10-21 — End: ?
  Administered 2020-10-21 – 2020-10-22 (×3): 20 mg via ORAL

## 2020-10-21 MED ORDER — SULFAMETHOXAZOLE-TRIMETHOPRIM 400-80 MG PO TAB
1 | Freq: Every day | ORAL | 0 refills | Status: AC
Start: 2020-10-21 — End: ?
  Administered 2020-10-21 – 2020-10-22 (×2): 1 via ORAL

## 2020-10-21 MED ORDER — ONDANSETRON HCL (PF) 4 MG/2 ML IJ SOLN
4 mg | INTRAVENOUS | 0 refills | Status: AC | PRN
Start: 2020-10-21 — End: ?

## 2020-10-21 MED ORDER — ONDANSETRON 4 MG PO TBDI
4 mg | ORAL | 0 refills | Status: AC | PRN
Start: 2020-10-21 — End: ?

## 2020-10-21 MED ORDER — FERROUS SULFATE 325 MG (65 MG IRON) PO TAB
325 mg | Freq: Every evening | ORAL | 0 refills | Status: AC
Start: 2020-10-21 — End: ?
  Administered 2020-10-22: 02:00:00 325 mg via ORAL

## 2020-10-21 MED ORDER — FAMOTIDINE 20 MG PO TAB
20 mg | Freq: Every day | ORAL | 0 refills | Status: AC
Start: 2020-10-21 — End: ?
  Administered 2020-10-21 – 2020-10-22 (×2): 20 mg via ORAL

## 2020-10-21 MED ORDER — PROCHLORPERAZINE MALEATE 10 MG PO TAB
10 mg | ORAL | 0 refills | Status: AC | PRN
Start: 2020-10-21 — End: ?

## 2020-10-21 MED ORDER — INSULIN ASPART 100 UNIT/ML SC FLEXPEN
20 [IU] | Freq: Three times a day (TID) | SUBCUTANEOUS | 0 refills | Status: AC
Start: 2020-10-21 — End: ?

## 2020-10-21 MED ORDER — MIDAZOLAM 1 MG/ML IJ SOLN
0 refills | Status: CP
Start: 2020-10-21 — End: ?
  Administered 2020-10-21 (×2): 1 mg via INTRAVENOUS

## 2020-10-21 MED ORDER — SODIUM CHLORIDE 0.9 % IV SOLP
500 mL | Freq: Once | INTRAVENOUS | 0 refills | Status: CP
Start: 2020-10-21 — End: ?
  Administered 2020-10-21: 15:00:00 500 mL via INTRAVENOUS

## 2020-10-21 MED ORDER — SENNOSIDES-DOCUSATE SODIUM 8.6-50 MG PO TAB
1 | Freq: Every day | ORAL | 0 refills | Status: AC | PRN
Start: 2020-10-21 — End: ?

## 2020-10-21 MED ORDER — INSULIN GLARGINE 100 UNIT/ML (3 ML) SC INJ PEN
75 [IU] | Freq: Two times a day (BID) | SUBCUTANEOUS | 0 refills | Status: AC
Start: 2020-10-21 — End: ?
  Administered 2020-10-22: 02:00:00 75 [IU] via SUBCUTANEOUS

## 2020-10-21 MED ORDER — TRAMADOL 50 MG PO TAB
50 mg | ORAL | 0 refills | Status: AC | PRN
Start: 2020-10-21 — End: ?

## 2020-10-21 MED ORDER — FENTANYL CITRATE (PF) 50 MCG/ML IJ SOLN
0 refills | Status: CP
Start: 2020-10-21 — End: ?
  Administered 2020-10-21: 15:00:00 100 ug via INTRAVENOUS

## 2020-10-21 MED ORDER — MIDAZOLAM 1 MG/ML IJ SOLN
1 mg | Freq: Once | INTRAVENOUS | 0 refills | Status: CP
Start: 2020-10-21 — End: ?
  Administered 2020-10-21: 15:00:00 1 mg via INTRAVENOUS

## 2020-10-21 NOTE — Procedures
Immediate Post Procedure Note    Date:  10/21/2020                                      Attending Physician:   Theda Sers  Assistant(s):  None    Procedure(s):  Transplant kidney biopsy  Preprocedure/postprocedure diagnosis:  Acute Rejection  Description of procedure and procedural findings:   3 18 ga cores taken of RLQ transplant kidney, verified by direct microscope visualization, gelfoam applied to needle tract and hemostasis acheived.  Anesthesia: Local 10 mL 1% lidocaine without epinephrine  Sedation/Medication Plan: Midazolam and Fentanyl    Time out performed: Consent obtained, correct patient verified, correct procedure verified, correct site verified, patient marked as necessary.  Estimated Blood Loss:  None/Negligible  Specimen(s) Removed/Disposition:  Yes, sent to pathology  Complications: None      [name], MD

## 2020-10-21 NOTE — H&P (View-Only)
IR Pre-Procedure History and Physical/Sedation Plan    Procedure Date: 10/21/2020     Planned Procedure(s):  Renal transplant biopsy    Procedural code status: Full Code    Indication:  Concern for acute rejection  __________________________________________________________________    Chief Complaint:  Kidney transplant, concern for acute rejection     History of Present Illness: Jacqueline Shields is a 55 y.o. female with a history as listed below who presents today for procedure. She has some nausea and anxiety on exam.     Patient Active Problem List    Diagnosis Date Noted   ? Dyslipidemia 08/03/2020   ? ATN (acute tubular necrosis) (HCC) 07/30/2020   ? Obesity 07/15/2020   ? 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 Immunosuppression (HCC) 07/15/2020   ? 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 PVD (peripheral vascular disease) (HCC) 06/15/2020   ? 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 Hypertension    ? ESRD (end stage renal disease) (HCC)    ? Diabetes mellitus [facility])      Medical History:   Diagnosis Date   ? Abnormal biopsy of kidney    ? Anemia    ? 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 Diabetes mellitus (HCC)    ? ESRD (end stage renal disease) (HCC)    ? Hypertension    ? Kidney failure    ? MGUS (monoclonal gammopathy of unknown significance)    ? Proteinuria       Surgical History:   Procedure Laterality Date   ? ANGIOGRAPHY CORONARY ARTERY WITH LEFT HEART CATHETERIZATION N/A 07/03/2020    Performed by Harley Alto, MD at [facility] CATH LAB   ? POSSIBLE PERCUTANEOUS CORONARY STENT PLACEMENT WITH ANGIOPLASTY N/A 07/03/2020    Performed by Harley Alto, MD at [facility] CATH LAB   ? ALLOTRANSPLANTATION KIDNEY FROM NON LIVING DONOR WITHOUT RECIPIENT NEPHRECTOMY N/A 07/15/2020    Performed by Nicholos Johns, MD at [facility] OR   ? REMOVAL TUNNELED INTRAPERITONEAL CATHETER Bilateral 08/24/2020    Performed by Nicholos Johns, MD at [facility] OR   ? CYSTOURETHROSCOPY WITH REMOVAL FOREIGN BODY/ CALCULUS/ STENT FROM URETHRA/ BLADDER - SIMPLE Right 08/24/2020    Performed by Marylen Ponto, MD at [facility] OR   ? CATHETER IMPLANT/REVISION      PD cath   ? HX HYSTERECTOMY     ? HX LITHOTRIPSY        Social History     Tobacco Use   ? Smoking status: Never Smoker   ? Smokeless tobacco: Never Used   Substance Use Topics   ? Alcohol use: Not Currently      Family History   Problem Relation Age of Onset   ? Diabetes Mother    ? Hypertension Mother    ? Diabetes Father    ? Hypertension Father    ? Cancer-Hematologic Father    ? Diabetes Sister    ? Migraines Sister    ? Cancer Sister         Endometrial      Medications Prior to Admission   Medication Sig Dispense Refill Last Dose   ? acetaminophen (TYLENOL) 325 mg tablet Take one tablet to two tablets by mouth every 6 hours as needed. 120 tablet 1 10/20/2020   ? aspirin EC (ASPIR-LOW) 81 mg tablet Take one tablet by mouth daily. Take with food. 90 tablet 3 10/20/2020   ?  calcitrioL (ROCALTROL) 0.25 mcg capsule Take one capsule by mouth daily. 90 capsule 3 10/20/2020   ? cholecalciferol (VITAMIN D-3) 50 mcg (2,000 unit) tablet Take one tablet by mouth daily. 90 tablet 3 10/20/2020   ? docusate (COLACE) 100 mg capsule Take one capsule by mouth twice daily. 180 capsule 0 10/20/2020   ? famotidine (PEPCID) 20 mg tablet Take one tablet by mouth daily. Indications: stress ulcer prevention 30 tablet 5 10/20/2020   ? ferrous sulfate (FEOSOL) 325 mg (65 mg iron) tablet Take one tablet by mouth at bedtime daily. Take on an empty stomach at least 1 hour before or 2 hours after food. 30 tablet 3 10/20/2020   ? insulin aspart (U-100) (NOVOLOG FLEXPEN U-100 INSULIN) 100 unit/mL (3 mL) PEN Follow Novolog mealtime dose taper to match prednisone taper.  Once off of prednisone, continue 5 units with meals and add 0-14 units per correction scale.  Max 84 units/day (Patient taking differently: 20 Units. Follow Novolog mealtime dose taper to match prednisone taper. Once off of prednisone, continue 5 units with meals and add 0-14 units per correction scale.  Max 84 units/day) 45 mL 1 10/20/2020   ? insulin glargine (LANTUS SOLOSTAR U-100 INSULIN) 100 unit/mL (3 mL) subcutaneous PEN Inject 65 Units under the skin twice daily. (Patient taking differently: Inject 75 Units under the skin twice daily.) 45 mL 1 10/20/2020   ? insulin pen needles (disposable) (BD UF NANO PEN NEEDLES) 32 gauge x 5/32 pen needle Use one each as directed as Needed. Use with insulin injections. 300 each 3 10/20/2020   ? levoFLOXacin (LEVAQUIN) 250 mg tablet Take 2 tabs today then one tab every day for 7 days. 8 tablet 0 10/20/2020   ? mycophenolate DR (MYFORTIC) 180 mg TbEC tablet Take four tablets by mouth twice daily. 240 tablet 11 10/20/2020   ? polyethylene glycol 3350 (MIRALAX) 17 gram/dose powder Take seventeen g by mouth daily. 527 g 0 10/20/2020   ? rosuvastatin (CRESTOR) 20 mg tablet Take one tablet by mouth daily. 90 tablet 1 10/20/2020   ? tacrolimus (PROGRAF) 1 mg capsule Take four capsules by mouth twice daily. 240 capsule 11 10/20/2020   ? trimethoprim/sulfamethoxazole (BACTRIM) 80/400 mg tablet Take one tablet by mouth daily. Indications: infection prophylaxis, medical 30 tablet 5 10/20/2020     No Known Allergies    Review of Systems  Gastrointestinal: positive for nausea    Previous Personal Anesthetic/Sedation History:  Denies adverse events related to sedation/anesthesia.     Previous Family Anesthetic/Sedation History: Denies adverse events related to sedation/anesthesia.    Physical Exam:  Vital Signs: Last Filed In 24 Hours Vital Signs: 24 Hour Range   BP: 117/80 (03/23 0930)  Temp: 36.4 ?C (97.6 ?F) (03/23 0930)  Pulse: 76 (03/23 0930)  Respirations: 15 PER MINUTE (03/23 0930)  SpO2: 98 % (03/23 0930)  SpO2 Pulse: 76 (03/23 0930)  Height: 172.7 cm (5' 8) (03/23 0930) BP: (117)/(80)   Temp:  [36.4 ?C (97.6 ?F)]   Pulse:  [76]   Respirations:  [15 PER MINUTE]   SpO2:  [98 %] General appearance: Alert and no distress noted.  Neurologic: Grossly normal.  Lungs: Non labored at rest.  Heart: Regular rate and rhythm  Abdomen: Non-distended    Airway: airway assessment performed  Mallampati II (soft palate, uvula, fauces visible)  Head and Neck: no abnormalities noted  Mouth: no abnormalities noted  NPO status: Acceptable  Pregnancy Status: Not Pregnant  Anesthesia Classification:  ASA III (  A patient with a severe systemic disease that limits activity, but is not incapacitating)  Pre-operative anxiolysis Plan: Midazolam  Sedation/Medication Plan: Fentanyl, Lidocaine and Midazolam  Discussion/Reviews:  Physician has discussed risks and alternatives of this type of sedation and above planned procedures with patient    Lab/Radiology/Other Diagnostic Tests:  Labs:  Pertinent labs reviewed           Pollyann Kennedy, APRN-NP  Pager [phone]

## 2020-10-21 NOTE — Progress Notes
Pt with small amount of yellow emesis upon return to room from procedure. HOB elevated briefly. Cool cloth to head and neck. Had Zofran prior to procedure. Will continue to assess.

## 2020-10-21 NOTE — H&P (View-Only)
Name:  Jacqueline Shields                                             MRN:  [id number]   Admission Date:  10/21/2020    Principal Problem:    Status post biopsy of kidney  Active Problems:    MGUS (monoclonal gammopathy of unknown significance)    Hypertension    ESRD (end stage renal disease) (HCC)    PVD (peripheral vascular disease) (HCC)    Coronary artery disease due to lipid rich plaque    Obesity    Immunosuppression (HCC)    Dyslipidemia                       Assessment/Plan:    55 yo F w/ PMH of ESRD 2/2 DM s/p DDRT 07/15/20, HTN, IDA, MGUS, CAD admitted for extended recovery following renal biopsy.    ESRD  S/p DDRT 12/21  Elevated Cr  -follows w/ Dr. Chales Abrahams at [facility]  -pta regimen Myfortic, Prograf, Bactrim  Plan  -cont pta regimen  -post bx CBC ordered  -transplant nephrology consulted     Nausea and Vomiting  Lightheadedness  -started the evening of 3/22, had one large episode of vomiting and felt nauseas and lightheaded  -small episode of emesis prior to procedure today   -no fevers, diarrhea or abdominal pain  Plan   -cont prn zofran  -defer abdominal imaging for now w/ no pain and resolution of sx currently     HTN  -not currently on medication     T2DM  -pta regimen Lantus 75u BID, Novolog 20u TID w/ meals, says she hasn't been eating lunch so really taking BID at times  -A1c 10.5% this month  Plan  -cont pta regimen for now w/ [facility], may need to decrease if n/v resumes  -diabetes ed c/s for new glucometer.  Pt isn't sure which one she and her husband have been sharing but it is not working    CAD  -s/p cardiac cath 12/21 which demonstrated 40-50% mid right CAD, 80% small branch vessel disease  -pta regimen ASA, Rosuvastatin   Plan  -hold ASA, cont rosuvastatin     MGUS  IDA  -dx 08/2017 while admitted at OSH, extensive workup completed and was negative for malignancy or amyloid  -on iron supplementation pta    FEN  -No IVF  -lytes stable  -ADA diet     DVT prophylaxis: SCD     Code status: Full code-discussed with pt    Disposition:  Admit to med private for extended recovery care     Plan of care was discussed in detail with the patient   They verbalized understanding and an agreement with the above plan  ______________________________________________________________________________    Primary Care Physician: Leron Croak     Chief Complaint: post renal biopsy     History of Present Illness: Jacqueline Shields is a 55 y.o. female with PMH as above who presents to IR today for a scheduled renal biopsy.  Patient reports her creatinine has been trending up which prompted the renal biopsy.  She also reports acute onset of nausea vomiting and lightheadedness that started last night.  She reports one large episode of nonbloody emesis and feeling lightheaded when she stood.  She received a 500 mL IVF bolus while in IR  and reports Zofran has resolved her nausea.  She currently denies any pain.  She has not yet been able to stand post procedure to assess for lightheadedness.  She denies any other complaints.    Medical History:   Diagnosis Date   ? Abnormal biopsy of kidney    ? Anemia    ? 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 Diabetes mellitus (HCC)    ? ESRD (end stage renal disease) (HCC)    ? Hypertension    ? Kidney failure    ? MGUS (monoclonal gammopathy of unknown significance)    ? Proteinuria      Surgical History:   Procedure Laterality Date   ? ANGIOGRAPHY CORONARY ARTERY WITH LEFT HEART CATHETERIZATION N/A 07/03/2020    Performed by Harley Alto, MD at [facility] CATH LAB   ? POSSIBLE PERCUTANEOUS CORONARY STENT PLACEMENT WITH ANGIOPLASTY N/A 07/03/2020    Performed by Harley Alto, MD at [facility] CATH LAB   ? 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 REMOVAL TUNNELED INTRAPERITONEAL CATHETER Bilateral 08/24/2020    Performed by Nicholos Johns, MD at [facility] OR   ? CYSTOURETHROSCOPY WITH REMOVAL FOREIGN BODY/ CALCULUS/ STENT FROM URETHRA/ BLADDER - SIMPLE Right 08/24/2020    Performed by Marylen Ponto, MD at [facility] OR   ? CATHETER IMPLANT/REVISION      PD cath   ? HX HYSTERECTOMY     ? HX LITHOTRIPSY       Family History   Problem Relation Age of Onset   ? Diabetes Mother    ? Hypertension Mother    ? Diabetes Father    ? Hypertension Father    ? Cancer-Hematologic Father    ? Diabetes Sister    ? Migraines Sister    ? Cancer Sister         Endometrial     Social History     Socioeconomic History   ? Marital status: Married     Spouse name: Not on file   ? Number of children: Not on file   ? Years of education: Not on file   ? Highest education level: Not on file   Occupational History   ? Not on file   Tobacco Use   ? Smoking status: Never Smoker   ? Smokeless tobacco: Never Used   Vaping Use   ? Vaping Use: Never used   Substance and Sexual Activity   ? Alcohol use: Not Currently   ? Drug use: Never   ? Sexual activity: Not on file   Other Topics Concern   ? Not on file   Social History Narrative   ? Not on file      Immunizations (includes history and patient reported):   Immunization History   Administered Date(s) Administered   ? COVID-19 (MODERNA), mRNA vacc, 100 mcg/0.5 mL (PF) 10/16/2019, 11/13/2019           Allergies:  Patient has no known allergies.    Medications:  No current facility-administered medications for this encounter.     Review of Systems:  A comprehensive  14 point review of organ systems reviewed and was negative except for nausea that is improving and lightheadedness     Physical Exam:  Vital Signs: Last Filed In 24 Hours Vital Signs: 24 Hour Range   BP: 123/77 (03/23 1034)  Temp: 36.7 ?  C (98 ?F) (03/23 1034)  Pulse: 72 (03/23 1034)  Respirations: 16 PER MINUTE (03/23 1034)  SpO2: 99 % (03/23 1034)  SpO2 Pulse: 65 (03/23 1030)  Height: 172.7 cm (5' 8) (03/23 0930) BP: (99-123)/(66-80)   Temp:  [36.4 ?C (97.6 ?F)-36.7 ?C (98 ?F)]   Pulse:  [65-78]   Respirations:  [11 PER MINUTE-16 PER MINUTE]   SpO2:  [98 %-100 %]           General:  Alert, awake, oriented x 3 , cooperative, no distress, appears stated age  Head:  Normocephalic, without obvious abnormality, atraumatic  Eyes:  Conjunctivae/corneas clear  Nose: Nares normal. Mucosa normal.  No drainage or sinus tenderness  Throat: Lips, mucosa and tongue normal  Neck:    Supple, symmetrical, trachea midline, no adenopathy  Lungs:  Clear to auscultation bilaterally  Heart:   Regular rate and rhythm, S1, S2 normal, no murmur  Abdomen:  Soft, non-tender.  Bowel sounds normal.    Extremities: Extremities normal, atraumatic, no cyanosis or edema  Skin: Skin color, texture, turgor normal.      Lab/Radiology/Other Diagnostic Tests:  24-hour labs:    Results for orders placed or performed during the hospital encounter of 10/21/20 (from the past 24 hour(s))   POC GLUCOSE    Collection Time: 10/21/20  9:26 AM   Result Value Ref Range    Glucose, POC 213 (H) 70 - 100 MG/DL     POC Glucose (Download): (!) 213 (10/21/20 1610)  Pertinent radiology reviewed.    No results found.    Lupe Carney, APRN, FNP-C  Pager [phone]

## 2020-10-21 NOTE — Patient Instructions
INTERVENTIONAL RADIOLOGY DISCHARGE INSTRUCTIONS  PERCUTANEOUS RENAL BIOPSY  A percutaneous renal biopsy is a procedure in which a special needle is used to remove a small piece of tissue from your kidney to examine for signs of damage and disease.?? The radiologist will determine whether the procedure is to be done using ultrasound or CT guidance.? The procedure usually lasts less than one hour with a 4-6 hour recovery period before you are allowed to return home.  POST-PROCEDURE PAIN:  ? Pain control following your procedure is a priority for both you and your physicians.  ? Some soreness or tenderness at the site is to be expected for several days. We recommend taking over the counter analgesics to help relieve this pain.  ? Alternative methods for pain relief include but not limited to heat or cold compress, relaxation techniques, rest, and changing of positions.?  ? If pain continues after 5-7 days or you have severe pain not relieved by medication, please contact us as directed below.?  POST-PROCEDURE ACTIVITY:  ? A responsible adult must drive you home.?If you receive sedation, you should not drive or operate heavy machinery or do anything that requires concentration for at least 24 hours after receiving sedation.  ? It is recommended that a responsible adult be with you until morning.  ? Avoid lifting more than 5 lbs. for one week and avoid exercises that use your abdominal muscles.? Also avoid pushing, pulling or straining.  POST-PROCEDURE SITE CARE:  ? You will have a small bandage over the site.? Keep this dry.? You may remove it in 24 hours.  ? You may shower in 24 hours, after removing the bandage.  ? Do not submerge the site underwater for one week (no swimming/hot tub, etc.)  ? Be sure your hands are clean when touching near the site.  ? Do not use ointments, creams or powders on the puncture site.?  DIET/MEDICATIONS:  ? You may resume your previous diet 1-2 hours after the procedure.  ? If you receive sedation or narcotic pain medications, avoid any foods or beverages containing alcohol for at least 24 hours after the procedure.  ? Please see the Medication Reconciliation sheet for instructions on resuming your home medications.?  WHEN TO CALL THE DOCTOR:  ? Blood in your urine  ? Fever?of?100.4?F or?higher, or?chills?  ? Increasing redness, tenderness, or swelling at the biopsy site  ? Bleeding or drainage from the biopsy site  ? You have severe pain not relieved by medication.? Some soreness or tenderness at the site is to be expected for several days.  ? You have persistent nausea or vomiting.  ?  For any of the above symptoms or for problems or concerns related to the procedure,? call? [phone] for Monday-Friday 7-5.? After-hours and weekends, please call? [phone] and ask for the Interventional Radiology Resident on-call.  You or your caregiver should call 911 for any severe symptoms such as excessive bleeding, severe dizziness, trouble breathing or loss of consciousness.

## 2020-10-21 NOTE — Progress Notes
Pt transported per cart to inpt room. Report given to bedside RN. Husband at bedside. Transported with belongings.

## 2020-10-21 NOTE — Progress Notes
Pt thought she was feeling better and wanted to try to take her medications. Upon sitting up slightly she was immediately nauseous and vomited. Compazine given IVP. Cool cloth to head. No PO meds given.

## 2020-10-22 ENCOUNTER — Encounter: Admit: 2020-10-22 | Discharge: 2020-10-22 | Payer: MEDICARE

## 2020-10-22 DIAGNOSIS — E119 Type 2 diabetes mellitus without complications: Secondary | ICD-10-CM

## 2020-10-22 DIAGNOSIS — R809 Proteinuria, unspecified: Secondary | ICD-10-CM

## 2020-10-22 DIAGNOSIS — R897 Abnormal histological findings in specimens from other organs, systems and tissues: Secondary | ICD-10-CM

## 2020-10-22 DIAGNOSIS — D649 Anemia, unspecified: Secondary | ICD-10-CM

## 2020-10-22 DIAGNOSIS — N19 Unspecified kidney failure: Secondary | ICD-10-CM

## 2020-10-22 DIAGNOSIS — I10 Essential (primary) hypertension: Secondary | ICD-10-CM

## 2020-10-22 DIAGNOSIS — D472 Monoclonal gammopathy: Secondary | ICD-10-CM

## 2020-10-22 DIAGNOSIS — I2510 Atherosclerotic heart disease of native coronary artery without angina pectoris: Secondary | ICD-10-CM

## 2020-10-22 DIAGNOSIS — N186 End stage renal disease: Secondary | ICD-10-CM

## 2020-10-22 MED FILL — LANCETS 33 GAUGE MISC MISC: 33 gauge | 25 days supply | Qty: 300 | Fill #1 | Status: CP

## 2020-10-22 MED FILL — ONETOUCH VERIO TEST STRIPS MISC STRP: 25 days supply | Qty: 100 | Fill #1 | Status: CP

## 2020-10-24 ENCOUNTER — Encounter: Admit: 2020-10-24 | Discharge: 2020-10-24 | Payer: MEDICARE

## 2020-10-25 ENCOUNTER — Encounter: Admit: 2020-10-25 | Discharge: 2020-10-25 | Payer: MEDICARE

## 2020-10-26 ENCOUNTER — Encounter: Admit: 2020-10-26 | Discharge: 2020-10-26 | Payer: MEDICARE

## 2020-10-26 MED FILL — TACROLIMUS 1 MG PO CAP: 1 mg | ORAL | 30 days supply | Qty: 240 | Fill #1 | Status: AC

## 2020-10-26 MED FILL — CALCITRIOL 0.25 MCG PO CAP: 0.25 mcg | ORAL | 90 days supply | Qty: 90 | Fill #2 | Status: AC

## 2020-10-26 NOTE — Telephone Encounter
Reviewed labs from 10/22/2020 at this time    Hgb 9.9  WBC 8.5  Creatinine 2.55  Tacrolimus 9.8 (Goal 5-10)    TC to patient and left VM for callback.

## 2020-10-31 ENCOUNTER — Encounter: Admit: 2020-10-31 | Discharge: 2020-10-31 | Payer: MEDICARE

## 2020-10-31 ENCOUNTER — Ambulatory Visit: Admit: 2020-10-31 | Discharge: 2020-10-31 | Payer: MEDICARE

## 2020-10-31 DIAGNOSIS — Z940 Kidney transplant status: Secondary | ICD-10-CM

## 2020-10-31 DIAGNOSIS — Z79899 Other long term (current) drug therapy: Secondary | ICD-10-CM

## 2020-10-31 DIAGNOSIS — D849 Immunodeficiency, unspecified: Secondary | ICD-10-CM

## 2020-10-31 LAB — CBC AND DIFF
Lab: 0 K/UL (ref 0–0.20)
Lab: 0 K/UL (ref 0–0.45)
Lab: 0.4 K/UL — ABNORMAL LOW (ref 1.0–4.8)
Lab: 0.6 K/UL (ref 0–0.80)
Lab: 1 % (ref 0–2)
Lab: 11 % (ref 4–12)
Lab: 14 % (ref 11–15)
Lab: 179 K/UL (ref 150–400)
Lab: 2 % — ABNORMAL LOW (ref >60)
Lab: 29 % — ABNORMAL LOW (ref 36–45)
Lab: 3.2 M/UL — ABNORMAL LOW (ref 4.0–5.0)
Lab: 30 pg (ref 26–34)
Lab: 33 g/dL (ref 32.0–36.0)
Lab: 4.5 K/UL (ref 1.8–7.0)
Lab: 5.7 K/UL (ref 4.5–11.0)
Lab: 78 % — ABNORMAL HIGH (ref 41–77)
Lab: 8 % — ABNORMAL LOW (ref 24–44)
Lab: 89 FL (ref 80–100)
Lab: 9.1 FL (ref 7–11)
Lab: 9.8 g/dL — ABNORMAL LOW (ref 12.0–15.0)

## 2020-10-31 LAB — PROTEIN/CR RATIO,UR RAN
Lab: 13 mg/dL
Lab: 68 mg/dL — AB

## 2020-10-31 LAB — URIC ACID: Lab: 5.7 mg/dL (ref 2.0–7.0)

## 2020-10-31 LAB — URINALYSIS DIPSTICK REFLEX TO CULTURE
Lab: 5 (ref 5.0–8.0)
Lab: NEGATIVE

## 2020-10-31 LAB — URINALYSIS MICROSCOPIC REFLEX TO CULTURE

## 2020-10-31 LAB — COMPREHENSIVE METABOLIC PANEL
Lab: 142 MMOL/L (ref 137–147)
Lab: 4.6 MMOL/L (ref 3.5–5.1)

## 2020-10-31 LAB — MAGNESIUM: Lab: 1.4 mg/dL — ABNORMAL LOW (ref 1.6–2.6)

## 2020-10-31 LAB — PHOSPHORUS: Lab: 3.7 mg/dL (ref 2.0–4.5)

## 2020-11-02 ENCOUNTER — Encounter: Admit: 2020-11-02 | Discharge: 2020-11-02 | Payer: MEDICARE

## 2020-11-02 MED ORDER — TACROLIMUS 1 MG PO CAP
6 mg | ORAL_CAPSULE | Freq: Two times a day (BID) | ORAL | 3 refills | 30.00000 days | Status: AC
Start: 2020-11-02 — End: ?

## 2020-11-02 NOTE — Telephone Encounter
Reviewed labs from 10/31/2020 at this time    Hgb 9.8  WBC 5.7  Creatinine  2.06  Mg 1.4  Phos 3.7  Tacrolimus 3.3 (Goal 5-10) level accurate patient is taking tacrolimus 4/4    Received the following orders from Dr. Lyndel Safe :    Increase Tacrolimus to 6mg  BID     Reviewed labs with patient at this time via phone   Gave dose change to patient at this time and patient repeated it back to me correctly  Medication list updated

## 2020-11-03 ENCOUNTER — Encounter: Admit: 2020-11-03 | Discharge: 2020-11-03 | Payer: MEDICARE

## 2020-11-04 ENCOUNTER — Encounter: Admit: 2020-11-04 | Discharge: 2020-11-04 | Payer: MEDICARE

## 2020-11-04 ENCOUNTER — Ambulatory Visit: Admit: 2020-11-04 | Discharge: 2020-11-04 | Payer: MEDICARE

## 2020-11-04 DIAGNOSIS — D472 Monoclonal gammopathy: Secondary | ICD-10-CM

## 2020-11-04 DIAGNOSIS — I10 Essential (primary) hypertension: Secondary | ICD-10-CM

## 2020-11-04 DIAGNOSIS — N186 End stage renal disease: Secondary | ICD-10-CM

## 2020-11-04 DIAGNOSIS — N19 Unspecified kidney failure: Secondary | ICD-10-CM

## 2020-11-04 DIAGNOSIS — Z79899 Other long term (current) drug therapy: Secondary | ICD-10-CM

## 2020-11-04 DIAGNOSIS — E119 Type 2 diabetes mellitus without complications: Secondary | ICD-10-CM

## 2020-11-04 DIAGNOSIS — I2510 Atherosclerotic heart disease of native coronary artery without angina pectoris: Secondary | ICD-10-CM

## 2020-11-04 DIAGNOSIS — R809 Proteinuria, unspecified: Secondary | ICD-10-CM

## 2020-11-04 DIAGNOSIS — D849 Immunodeficiency, unspecified: Secondary | ICD-10-CM

## 2020-11-04 DIAGNOSIS — R897 Abnormal histological findings in specimens from other organs, systems and tissues: Secondary | ICD-10-CM

## 2020-11-04 DIAGNOSIS — Z940 Kidney transplant status: Secondary | ICD-10-CM

## 2020-11-04 DIAGNOSIS — D649 Anemia, unspecified: Secondary | ICD-10-CM

## 2020-11-04 LAB — PROTEIN/CR RATIO,UR RAN
Lab: 15 mg/dL — ABNORMAL LOW (ref 8.5–10.6)
Lab: 58 mg/dL — ABNORMAL LOW (ref 6.0–8.0)

## 2020-11-04 LAB — COMPREHENSIVE METABOLIC PANEL
Lab: 1.9 mg/dL — ABNORMAL HIGH (ref 0.4–1.00)
Lab: 145 MMOL/L — ABNORMAL LOW (ref 137–147)
Lab: 3.7 g/dL — ABNORMAL LOW (ref 3.5–5.0)
Lab: 4 MMOL/L — ABNORMAL LOW (ref 3.5–5.1)
Lab: 61 mg/dL — ABNORMAL LOW (ref 70–100)

## 2020-11-04 LAB — URINALYSIS DIPSTICK REFLEX TO CULTURE

## 2020-11-04 LAB — PHOSPHORUS: Lab: 3.1 mg/dL — ABNORMAL HIGH (ref 2.0–4.5)

## 2020-11-04 LAB — URINALYSIS MICROSCOPIC REFLEX TO CULTURE

## 2020-11-04 LAB — URIC ACID: Lab: 5.9 mg/dL — ABNORMAL HIGH (ref 2.0–7.0)

## 2020-11-04 LAB — TACROLIMUS LC-MS/MS: Lab: 18 K/UL — ABNORMAL LOW (ref 3–12)

## 2020-11-04 LAB — CBC AND DIFF
Lab: 3.3 M/UL — ABNORMAL LOW (ref 4.0–5.0)
Lab: 5 K/UL (ref 4.5–11.0)

## 2020-11-04 LAB — MAGNESIUM: Lab: 1.3 mg/dL — ABNORMAL LOW (ref 1.6–2.6)

## 2020-11-04 NOTE — Patient Instructions
Return to see Dr. Fabio Neighbors in     Repeat labs every 2 weeks    Further testing needs: None today     Medication changes: None today

## 2020-11-05 ENCOUNTER — Encounter: Admit: 2020-11-05 | Discharge: 2020-11-05 | Payer: MEDICARE

## 2020-11-05 MED ORDER — TACROLIMUS 1 MG PO CAP
4 mg | ORAL_CAPSULE | Freq: Two times a day (BID) | ORAL | 11 refills | 30.00000 days | Status: AC
Start: 2020-11-05 — End: ?
  Filled 2020-11-24: qty 150, 30d supply, fill #1

## 2020-11-05 NOTE — Telephone Encounter
Reviewed pt's labs from 11/04/20. Creatinine stable at 1.96. FK supratherapeutic at 18.6. Discussed with Dr. Earney Hamburg and will decrease Prograf from 6/6 to 4/4. Called to discuss medication dose change, no answer but LVM. Also sent pt message via MyChart. Pt will repeat labs in 2 weeks.

## 2020-11-05 NOTE — Telephone Encounter
-----   Message from Fabio Neighbors, MD sent at 11/05/2020 10:50 AM CDT -----  4/4  ----- Message -----  From: Burnis Kingfisher, RN  Sent: 11/05/2020   9:05 AM CDT  To: Fabio Neighbors, MD, Clayborn Bigness, RN    FK 18.6. Currently on Prograf 6/6. Previous increase from 4/4 with level of 3.3

## 2020-11-06 ENCOUNTER — Encounter: Admit: 2020-11-06 | Discharge: 2020-11-06 | Payer: MEDICARE

## 2020-11-13 ENCOUNTER — Encounter: Admit: 2020-11-13 | Discharge: 2020-11-13 | Payer: MEDICARE

## 2020-11-14 ENCOUNTER — Encounter: Admit: 2020-11-14 | Discharge: 2020-11-14 | Payer: MEDICARE

## 2020-11-15 ENCOUNTER — Encounter: Admit: 2020-11-15 | Discharge: 2020-11-15 | Payer: MEDICARE

## 2020-11-16 ENCOUNTER — Encounter: Admit: 2020-11-16 | Discharge: 2020-11-16 | Payer: MEDICARE

## 2020-11-16 MED FILL — SULFAMETHOXAZOLE-TRIMETHOPRIM 400-80 MG PO TAB: 80/400 mg | ORAL | 30 days supply | Qty: 30 | Fill #5 | Status: AC

## 2020-11-16 MED FILL — FAMOTIDINE 20 MG PO TAB: 20 mg | ORAL | 30 days supply | Qty: 30 | Fill #5 | Status: AC

## 2020-11-16 MED FILL — MYCOPHENOLATE SODIUM 180 MG PO TBEC: 180 mg | ORAL | 30 days supply | Qty: 240 | Fill #5 | Status: AC

## 2020-11-17 ENCOUNTER — Encounter: Admit: 2020-11-17 | Discharge: 2020-11-17 | Payer: MEDICARE

## 2020-11-21 ENCOUNTER — Encounter: Admit: 2020-11-21 | Discharge: 2020-11-21 | Payer: MEDICARE

## 2020-11-21 ENCOUNTER — Ambulatory Visit: Admit: 2020-11-21 | Discharge: 2020-11-21 | Payer: MEDICARE

## 2020-11-21 DIAGNOSIS — Z79899 Other long term (current) drug therapy: Secondary | ICD-10-CM

## 2020-11-21 DIAGNOSIS — Z940 Kidney transplant status: Secondary | ICD-10-CM

## 2020-11-21 DIAGNOSIS — D849 Immunodeficiency, unspecified: Secondary | ICD-10-CM

## 2020-11-21 LAB — CBC AND DIFF
Lab: 0 K/UL (ref 0–0.20)
Lab: 0 K/UL (ref 0–0.45)
Lab: 0.6 K/UL (ref 0–0.80)
Lab: 1 % — ABNORMAL LOW (ref >60)
Lab: 3.5 M/UL — ABNORMAL LOW (ref 4.0–5.0)
Lab: 82 % — ABNORMAL HIGH (ref 41–77)
Lab: 88 FL — ABNORMAL LOW (ref 80–100)

## 2020-11-21 LAB — PHOSPHORUS: Lab: 3.8 mg/dL (ref 2.0–4.5)

## 2020-11-21 LAB — URINALYSIS MICROSCOPIC REFLEX TO CULTURE

## 2020-11-21 LAB — URINALYSIS DIPSTICK REFLEX TO CULTURE
Lab: 1 mg/dL — AB (ref 1.005–1.030)
Lab: NEGATIVE % (ref 0–2)
Lab: NEGATIVE % — ABNORMAL LOW (ref 24–44)
Lab: NEGATIVE FL (ref 7–11)
Lab: NEGATIVE K/UL — ABNORMAL LOW (ref 1.0–4.8)

## 2020-11-21 LAB — PROTEIN/CR RATIO,UR RAN: Lab: 0.1 K/UL (ref <0.15)

## 2020-11-21 LAB — COMPREHENSIVE METABOLIC PANEL
Lab: 109 MMOL/L (ref 98–110)
Lab: 142 MMOL/L (ref 137–147)
Lab: 4.7 MMOL/L (ref 3.5–5.1)

## 2020-11-21 LAB — MAGNESIUM: Lab: 1.5 mg/dL — ABNORMAL LOW (ref 1.6–2.6)

## 2020-11-21 LAB — URIC ACID: Lab: 7.3 mg/dL — ABNORMAL HIGH (ref 2.0–7.0)

## 2020-11-23 ENCOUNTER — Encounter: Admit: 2020-11-23 | Discharge: 2020-11-23 | Payer: MEDICARE

## 2020-11-23 MED ORDER — TACROLIMUS 1 MG PO CAP
ORAL_CAPSULE | Freq: Every day | ORAL | 3 refills | Status: CN
Start: 2020-11-23 — End: ?

## 2020-11-23 NOTE — Telephone Encounter
Reviewed labs from 11/21/2020 at this time    Hgb 10.0  WBC 6.0  Creatinine 2.69  Mg 1.5  Phos 3.8  Tacrolimus 12.2 (Goal  ~8)  UPC 0.1  Urine culture shows >100,000 CFU/ml   ENTEROCOCCUS FAECALIS    Received the following orders from Dr. Lyndel Safe :  Decrease Tacrolimus to 3mg  q am and 2 mg q pm    Reviewed labs with patient at this time via mychart   Gave dose change to patient at this time and asked for message back to confirm. Asked if patient having symptoms of UTI at this time

## 2020-11-24 ENCOUNTER — Encounter: Admit: 2020-11-24 | Discharge: 2020-11-24 | Payer: MEDICARE

## 2020-11-24 NOTE — Telephone Encounter
Received an incoming call from:    [place] Name: Jacqueline Shields     Relationship to Patient:self     Callback Number: on file     Purpose of Call: Pt is returning your call in regards to recent UTI test results

## 2020-12-05 ENCOUNTER — Ambulatory Visit: Admit: 2020-12-05 | Discharge: 2020-12-05 | Payer: MEDICARE

## 2020-12-05 ENCOUNTER — Encounter: Admit: 2020-12-05 | Discharge: 2020-12-05 | Payer: MEDICARE

## 2020-12-05 DIAGNOSIS — Z79899 Other long term (current) drug therapy: Secondary | ICD-10-CM

## 2020-12-05 DIAGNOSIS — Z940 Kidney transplant status: Secondary | ICD-10-CM

## 2020-12-05 DIAGNOSIS — D849 Immunodeficiency, unspecified: Secondary | ICD-10-CM

## 2020-12-05 LAB — COMPREHENSIVE METABOLIC PANEL
POTASSIUM: 4.6 MMOL/L (ref 3.5–5.1)
SODIUM: 142 MMOL/L (ref 137–147)

## 2020-12-05 LAB — PHOSPHORUS: PHOSPHORUS: 3.4 mg/dL — ABNORMAL HIGH (ref <0.15)

## 2020-12-05 LAB — CBC AND DIFF
ABSOLUTE EOS COUNT: 0.1 K/UL (ref 0–0.45)
ABSOLUTE LYMPH COUNT: 0.4 K/UL — ABNORMAL LOW (ref 1.0–4.8)
ABSOLUTE MONO COUNT: 0.5 K/UL (ref 0–0.80)
ABSOLUTE NEUTROPHIL: 5.5 K/UL (ref 1.8–7.0)
BASOPHILS %: 1 % (ref 0–2)
EOSINOPHILS %: 2 % — ABNORMAL LOW (ref >60)
HEMOGLOBIN: 10 g/dL — ABNORMAL LOW (ref 12.0–15.0)
LYMPHOCYTES %: 6 % — ABNORMAL LOW (ref 24–44)
MCHC: 32 g/dL (ref 32.0–36.0)
MCV: 88 FL (ref 80–100)
MONOCYTES %: 9 % (ref 4–12)
MPV: 9.3 FL (ref 7–11)
NEUTROPHILS %: 82 % — ABNORMAL HIGH (ref 41–77)
PLATELET COUNT: 215 K/UL (ref 150–400)
RBC COUNT: 3.6 M/UL — ABNORMAL LOW (ref 4.0–5.0)
RDW: 14 % (ref 11–15)
WBC COUNT: 6.7 K/UL (ref 4.5–11.0)

## 2020-12-05 LAB — URIC ACID: URIC ACID: 6.5 mg/dL — AB (ref 2.0–7.0)

## 2020-12-05 LAB — URINALYSIS MICROSCOPIC REFLEX TO CULTURE

## 2020-12-05 LAB — PROTEIN/CR RATIO,UR RAN
UR CREATININE, RAN: 72 mg/dL
UR TOTAL PROTEIN,RAN: 16 mg/dL

## 2020-12-05 LAB — URINALYSIS DIPSTICK REFLEX TO CULTURE
URINE BILE: NEGATIVE
URINE PH: 5 (ref 5.0–8.0)

## 2020-12-05 LAB — MAGNESIUM: MAGNESIUM: 1.5 mg/dL — ABNORMAL LOW (ref 1.6–2.6)

## 2020-12-08 ENCOUNTER — Encounter: Admit: 2020-12-08 | Discharge: 2020-12-08 | Payer: MEDICARE

## 2020-12-08 MED ORDER — TACROLIMUS 0.5 MG PO CAP
.5 mg | ORAL_CAPSULE | Freq: Two times a day (BID) | ORAL | 3 refills | 30.00000 days | Status: AC
Start: 2020-12-08 — End: ?
  Filled 2020-12-09: qty 60, 30d supply, fill #1

## 2020-12-08 MED ORDER — TACROLIMUS 1 MG PO CAP
1 mg | ORAL_CAPSULE | Freq: Two times a day (BID) | ORAL | 3 refills | 30.00000 days | Status: AC
Start: 2020-12-08 — End: ?

## 2020-12-08 NOTE — Telephone Encounter
Received an incoming call from:    [place] Name: Jacqueline Shields     Relationship to Patient: self     Callback Number: om file     Purpose of Call: Pt recvd a message from you in regards to her Tacrolimus  Test results .  Please call her back.   Thanks

## 2020-12-09 ENCOUNTER — Encounter: Admit: 2020-12-09 | Discharge: 2020-12-09 | Payer: MEDICARE

## 2020-12-14 ENCOUNTER — Encounter: Admit: 2020-12-14 | Discharge: 2020-12-14 | Payer: MEDICARE

## 2020-12-15 ENCOUNTER — Encounter: Admit: 2020-12-15 | Discharge: 2020-12-15 | Payer: MEDICARE

## 2020-12-16 ENCOUNTER — Encounter: Admit: 2020-12-16 | Discharge: 2020-12-16 | Payer: MEDICARE

## 2020-12-16 MED FILL — SULFAMETHOXAZOLE-TRIMETHOPRIM 400-80 MG PO TAB: 80/400 mg | ORAL | 30 days supply | Qty: 30 | Fill #6 | Status: AC

## 2020-12-16 MED FILL — FAMOTIDINE 20 MG PO TAB: 20 mg | ORAL | 30 days supply | Qty: 30 | Fill #6 | Status: AC

## 2020-12-17 ENCOUNTER — Encounter: Admit: 2020-12-17 | Discharge: 2020-12-17 | Payer: MEDICARE

## 2020-12-18 ENCOUNTER — Encounter: Admit: 2020-12-18 | Discharge: 2020-12-18 | Payer: MEDICARE

## 2020-12-19 ENCOUNTER — Encounter: Admit: 2020-12-19 | Discharge: 2020-12-19 | Payer: MEDICARE

## 2020-12-19 DIAGNOSIS — Z940 Kidney transplant status: Secondary | ICD-10-CM

## 2020-12-19 DIAGNOSIS — D849 Immunodeficiency, unspecified: Secondary | ICD-10-CM

## 2020-12-19 DIAGNOSIS — Z79899 Other long term (current) drug therapy: Secondary | ICD-10-CM

## 2020-12-19 LAB — URINALYSIS DIPSTICK REFLEX TO CULTURE
NITRITE: NEGATIVE
URINE BILE: NEGATIVE
URINE BLOOD: NEGATIVE
URINE KETONE: NEGATIVE
URINE SPEC GRAVITY: 1 — AB (ref 1.005–1.030)

## 2020-12-19 LAB — TACROLIMUS LC-MS/MS: TACROLIMUS LC-MS/MS: 8.8

## 2020-12-19 LAB — COMPREHENSIVE METABOLIC PANEL
ALBUMIN: 3.8 g/dL — ABNORMAL LOW (ref 3.5–5.0)
ALK PHOSPHATASE: 60 U/L (ref 25–110)
ALT: 23 U/L (ref 7–56)
ANION GAP: 9 K/UL — ABNORMAL LOW (ref 3–12)
AST: 22 U/L (ref 7–40)
CREATININE: 2.1 mg/dL — ABNORMAL HIGH (ref 0.4–1.00)
EGFR: 27 mL/min — ABNORMAL LOW (ref >60)
GLUCOSE,PANEL: 89 mg/dL (ref 70–100)
POTASSIUM: 4.7 MMOL/L — ABNORMAL LOW (ref 3.5–5.1)
SODIUM: 142 MMOL/L — ABNORMAL LOW (ref 137–147)
TOTAL BILIRUBIN: 0.3 mg/dL — ABNORMAL HIGH (ref 0.3–1.2)
TOTAL PROTEIN: 6.1 g/dL (ref 6.0–8.0)

## 2020-12-19 LAB — CBC AND DIFF
ABSOLUTE BASO COUNT: 0 K/UL (ref 0–0.20)
ABSOLUTE EOS COUNT: 0.1 K/UL (ref 0–0.45)
RBC COUNT: 3.4 M/UL — ABNORMAL LOW (ref 4.0–5.0)
WBC COUNT: 5.9 K/UL (ref 4.5–11.0)

## 2020-12-19 LAB — PROTEIN/CR RATIO,UR RAN
PROT CREAT RAT/CAL: 0.2 — ABNORMAL HIGH (ref <0.15)
UR CREATININE, RAN: 46 mg/dL
UR TOTAL PROTEIN,RAN: 8 mg/dL (ref 5.0–8.0)

## 2020-12-19 LAB — MAGNESIUM: MAGNESIUM: 1.3 mg/dL — ABNORMAL LOW (ref 1.6–2.6)

## 2020-12-19 LAB — PHOSPHORUS: PHOSPHORUS: 2.7 mg/dL — ABNORMAL HIGH (ref 2.0–4.5)

## 2020-12-19 LAB — URIC ACID: URIC ACID: 6.3 mg/dL — ABNORMAL LOW (ref 2.0–7.0)

## 2020-12-19 LAB — URINALYSIS MICROSCOPIC REFLEX TO CULTURE

## 2020-12-19 MED FILL — MYCOPHENOLATE SODIUM 180 MG PO TBEC: 180 mg | ORAL | 30 days supply | Qty: 240 | Fill #6 | Status: AC

## 2020-12-21 ENCOUNTER — Encounter: Admit: 2020-12-21 | Discharge: 2020-12-21 | Payer: MEDICARE

## 2020-12-21 NOTE — Telephone Encounter
Reviewed labs from 12/19/2020 at this time    Hgb 9.8  WBC 5.9  Creatinine 2.1  Mg 1.3  Phos 2.7  Tacrolimus 8.8 (Goal ~8)  UPC 0.2    Received the following orders from Dr. Lyndel Safe :  Increase magnesium in diet     Reviewed labs with patient at this time via mychart.Advised to increase mg in diet and no dose change.

## 2021-01-02 ENCOUNTER — Encounter: Admit: 2021-01-02 | Discharge: 2021-01-02 | Payer: MEDICARE

## 2021-01-02 DIAGNOSIS — Z940 Kidney transplant status: Secondary | ICD-10-CM

## 2021-01-02 DIAGNOSIS — D849 Immunodeficiency, unspecified: Secondary | ICD-10-CM

## 2021-01-02 DIAGNOSIS — Z79899 Other long term (current) drug therapy: Secondary | ICD-10-CM

## 2021-01-02 LAB — URINALYSIS DIPSTICK REFLEX TO CULTURE
NITRITE: NEGATIVE
URINE ASCORBIC ACID, UA: NEGATIVE
URINE BILE: NEGATIVE K/UL (ref 0–0.20)
URINE BLOOD: NEGATIVE
URINE PH: 6 /HPF (ref 5.0–8.0)

## 2021-01-02 LAB — URINALYSIS MICROSCOPIC REFLEX TO CULTURE

## 2021-01-02 LAB — COMPREHENSIVE METABOLIC PANEL
ALBUMIN: 3.7 g/dL — ABNORMAL LOW (ref 3.5–5.0)
AST: 23 U/L (ref 7–40)
CREATININE: 2 mg/dL — ABNORMAL HIGH (ref 0.4–1.00)
GLUCOSE,PANEL: 90 mg/dL (ref 70–100)
POTASSIUM: 4.4 MMOL/L — ABNORMAL LOW (ref 3.5–5.1)
SODIUM: 143 MMOL/L — ABNORMAL LOW (ref 137–147)

## 2021-01-02 LAB — PROTEIN/CR RATIO,UR RAN: UR TOTAL PROTEIN,RAN: 16 mg/dL — ABNORMAL LOW (ref 150–400)

## 2021-01-02 LAB — PHOSPHORUS: PHOSPHORUS: 3.6 mg/dL — ABNORMAL HIGH (ref 2.0–4.5)

## 2021-01-02 LAB — CBC AND DIFF
MPV: 9.1 FL (ref 7–11)
RBC COUNT: 3.7 M/UL — ABNORMAL LOW (ref 4.0–5.0)
WBC COUNT: 5.8 K/UL (ref 4.5–11.0)

## 2021-01-02 LAB — MAGNESIUM: MAGNESIUM: 1.4 mg/dL — ABNORMAL LOW (ref 1.6–2.6)

## 2021-01-02 LAB — URIC ACID: URIC ACID: 6.7 mg/dL — ABNORMAL HIGH (ref <0.15)

## 2021-01-05 MED FILL — TACROLIMUS 1 MG PO CAP: 1 mg | ORAL | 30 days supply | Qty: 120 | Fill #1 | Status: AC

## 2021-01-12 ENCOUNTER — Encounter: Admit: 2021-01-12 | Discharge: 2021-01-12 | Payer: MEDICARE

## 2021-01-12 NOTE — Telephone Encounter
01/02/2021: Allosure <0.12

## 2021-01-13 MED FILL — MYCOPHENOLATE SODIUM 180 MG PO TBEC: 180 mg | ORAL | 30 days supply | Qty: 240 | Fill #7 | Status: AC

## 2021-01-16 ENCOUNTER — Ambulatory Visit: Admit: 2021-01-16 | Discharge: 2021-01-16 | Payer: MEDICARE

## 2021-01-16 ENCOUNTER — Encounter: Admit: 2021-01-16 | Discharge: 2021-01-16 | Payer: MEDICARE

## 2021-01-16 DIAGNOSIS — D849 Immunodeficiency, unspecified: Secondary | ICD-10-CM

## 2021-01-16 DIAGNOSIS — Z79899 Other long term (current) drug therapy: Secondary | ICD-10-CM

## 2021-01-16 DIAGNOSIS — Z940 Kidney transplant status: Secondary | ICD-10-CM

## 2021-01-16 LAB — MAGNESIUM: MAGNESIUM: 1.2 mg/dL — ABNORMAL LOW (ref 1.6–2.6)

## 2021-01-16 LAB — CBC AND DIFF
HEMATOCRIT: 30 % — ABNORMAL LOW (ref 36–45)
MCH: 28 pg (ref 26–34)
MCHC: 33 g/dL (ref 32.0–36.0)
MPV: 9.4 FL (ref 7–11)
NEUTROPHILS %: 83 % — ABNORMAL HIGH (ref 41–77)
PLATELET COUNT: 214 K/UL (ref 150–400)
RBC COUNT: 3.6 M/UL — ABNORMAL LOW (ref 4.0–5.0)
RDW: 14 % (ref 11–15)
WBC COUNT: 5.7 K/UL — ABNORMAL HIGH (ref 4.5–11.0)

## 2021-01-16 LAB — URINALYSIS MICROSCOPIC REFLEX TO CULTURE

## 2021-01-16 LAB — URINALYSIS DIPSTICK REFLEX TO CULTURE
LEUKOCYTES: NEGATIVE
NITRITE: NEGATIVE
URINE ASCORBIC ACID, UA: NEGATIVE
URINE SPEC GRAVITY: 1 % — ABNORMAL LOW (ref >60)

## 2021-01-16 LAB — PROTEIN/CR RATIO,UR RAN
UR CREATININE, RAN: 55 mg/dL — AB (ref 0–0.45)
UR TOTAL PROTEIN,RAN: 12 mg/dL (ref 0–0.80)

## 2021-01-16 LAB — PHOSPHORUS: PHOSPHORUS: 2.7 mg/dL — ABNORMAL LOW (ref 2.0–4.5)

## 2021-01-16 LAB — URIC ACID: URIC ACID: 6.4 mg/dL — ABNORMAL HIGH (ref <0.15)

## 2021-01-16 LAB — COMPREHENSIVE METABOLIC PANEL
POTASSIUM: 4.1 MMOL/L (ref 3.5–5.1)
SODIUM: 142 MMOL/L (ref 137–147)

## 2021-01-16 NOTE — Telephone Encounter
Call from [facility] in [place] lab  No standing orders  Patient is here  Jacqueline Shields in to order labs and was notified they are in as routine 3 of 25 orders have been used then noted they expired 01/14/21  Verbal auth for the lab to manually enter today's labs  Issue routed to Primary [facility]

## 2021-01-18 ENCOUNTER — Encounter: Admit: 2021-01-18 | Discharge: 2021-01-18 | Payer: MEDICARE

## 2021-01-18 DIAGNOSIS — D849 Immunodeficiency, unspecified: Secondary | ICD-10-CM

## 2021-01-18 DIAGNOSIS — Z940 Kidney transplant status: Secondary | ICD-10-CM

## 2021-01-18 DIAGNOSIS — Z79899 Other long term (current) drug therapy: Secondary | ICD-10-CM

## 2021-01-19 ENCOUNTER — Encounter: Admit: 2021-01-19 | Discharge: 2021-01-19 | Payer: MEDICARE

## 2021-01-19 MED ORDER — MAGNESIUM OXIDE 400 MG (241.3 MG MAGNESIUM) PO TAB
400 mg | ORAL_TABLET | Freq: Every day | ORAL | 3 refills | Status: AC
Start: 2021-01-19 — End: ?

## 2021-01-19 MED ORDER — TACROLIMUS 1 MG PO CAP
ORAL_CAPSULE | Freq: Every day | ORAL | 3 refills | 30.00000 days | Status: AC
Start: 2021-01-19 — End: ?

## 2021-01-19 NOTE — Telephone Encounter
Reviewed labs from 01/16/2021 at this time    Hgb 10.1  WBC 5.7  Creatinine  2.2  Mg 1.2  Phos 2.7  Tacrolimus  11.5 (Goal ~8)  UPC 0.2    Received the following orders from Dr. Lyndel Safe :  Reduce Tacrolimus to 2 mg q am and 1 mg q pm   Start magnesium oxide 1 tablet daily (sent to [facility] in [place], [place])     Reviewed labs with patient at this time via mychart.   Gave dose change to patient at this time and asked for message back to confirm. Medication list updated

## 2021-01-20 ENCOUNTER — Ambulatory Visit: Admit: 2021-01-20 | Discharge: 2021-01-21 | Payer: MEDICARE

## 2021-01-20 ENCOUNTER — Encounter: Admit: 2021-01-20 | Discharge: 2021-01-20 | Payer: MEDICARE

## 2021-01-20 DIAGNOSIS — N186 End stage renal disease: Secondary | ICD-10-CM

## 2021-01-20 DIAGNOSIS — E1121 Type 2 diabetes mellitus with diabetic nephropathy: Secondary | ICD-10-CM

## 2021-01-20 DIAGNOSIS — D649 Anemia, unspecified: Secondary | ICD-10-CM

## 2021-01-20 DIAGNOSIS — D472 Monoclonal gammopathy: Secondary | ICD-10-CM

## 2021-01-20 DIAGNOSIS — R897 Abnormal histological findings in specimens from other organs, systems and tissues: Secondary | ICD-10-CM

## 2021-01-20 DIAGNOSIS — N19 Unspecified kidney failure: Secondary | ICD-10-CM

## 2021-01-20 DIAGNOSIS — I2510 Atherosclerotic heart disease of native coronary artery without angina pectoris: Secondary | ICD-10-CM

## 2021-01-20 DIAGNOSIS — R809 Proteinuria, unspecified: Secondary | ICD-10-CM

## 2021-01-20 DIAGNOSIS — E119 Type 2 diabetes mellitus without complications: Secondary | ICD-10-CM

## 2021-01-20 DIAGNOSIS — I10 Essential (primary) hypertension: Secondary | ICD-10-CM

## 2021-01-20 DIAGNOSIS — E872 Acidosis: Secondary | ICD-10-CM

## 2021-01-21 DIAGNOSIS — D849 Immunodeficiency, unspecified: Secondary | ICD-10-CM

## 2021-01-21 DIAGNOSIS — Z794 Long term (current) use of insulin: Secondary | ICD-10-CM

## 2021-01-21 DIAGNOSIS — Z940 Kidney transplant status: Secondary | ICD-10-CM

## 2021-01-30 ENCOUNTER — Encounter: Admit: 2021-01-30 | Discharge: 2021-01-30 | Payer: MEDICARE

## 2021-01-30 ENCOUNTER — Ambulatory Visit: Admit: 2021-01-30 | Discharge: 2021-01-30 | Payer: MEDICARE

## 2021-01-30 DIAGNOSIS — Z79899 Other long term (current) drug therapy: Secondary | ICD-10-CM

## 2021-01-30 DIAGNOSIS — D849 Immunodeficiency, unspecified: Secondary | ICD-10-CM

## 2021-01-30 DIAGNOSIS — Z940 Kidney transplant status: Secondary | ICD-10-CM

## 2021-01-30 LAB — PROTEIN/CR RATIO,UR RAN
PROT CREAT RAT/CAL: 0.3 — ABNORMAL HIGH (ref <0.15)
UR CREATININE, RAN: 45 mg/dL (ref 5.0–8.0)
UR TOTAL PROTEIN,RAN: 12 mg/dL — AB (ref 1.005–1.030)

## 2021-01-30 LAB — URINALYSIS DIPSTICK REFLEX TO CULTURE
GLUCOSE,UA: NEGATIVE
NITRITE: NEGATIVE
URINE ASCORBIC ACID, UA: NEGATIVE
URINE BILE: NEGATIVE
URINE BLOOD: NEGATIVE
URINE KETONE: NEGATIVE

## 2021-01-30 LAB — HEMOGLOBIN A1C: HEMOGLOBIN A1C: 6.2 % — ABNORMAL HIGH (ref 4.0–6.0)

## 2021-01-30 LAB — CBC AND DIFF
ABSOLUTE BASO COUNT: 0 K/UL (ref 0–0.20)
ABSOLUTE EOS COUNT: 0.1 K/UL (ref 0–0.45)
ABSOLUTE LYMPH COUNT: 0.2 K/UL — ABNORMAL LOW (ref 1.0–4.8)
ABSOLUTE MONO COUNT: 0.4 K/UL (ref 0–0.80)
ABSOLUTE NEUTROPHIL: 3.8 K/UL (ref 1.8–7.0)
BASOPHILS %: 0 % (ref 0–2)
EOSINOPHILS %: 2 % — ABNORMAL LOW (ref >60)
HEMATOCRIT: 32 % — ABNORMAL LOW (ref 36–45)
HEMOGLOBIN: 10 g/dL — ABNORMAL LOW (ref 12.0–15.0)
LYMPHOCYTES %: 6 % — ABNORMAL LOW (ref 24–44)
MCH: 27 pg (ref 26–34)
MCHC: 32 g/dL (ref 32.0–36.0)
MCV: 83 FL (ref 80–100)
MPV: 9.3 FL (ref 7–11)
NEUTROPHILS %: 83 % — ABNORMAL HIGH (ref 41–77)
PLATELET COUNT: 219 K/UL (ref 150–400)
RBC COUNT: 3.8 M/UL — ABNORMAL LOW (ref 4.0–5.0)
RDW: 14 % (ref 11–15)
WBC COUNT: 4.7 K/UL (ref 4.5–11.0)

## 2021-01-30 LAB — URINALYSIS MICROSCOPIC REFLEX TO CULTURE

## 2021-01-30 LAB — URIC ACID: URIC ACID: 6.1 mg/dL (ref 2.0–7.0)

## 2021-01-30 LAB — PHOSPHORUS: PHOSPHORUS: 3.1 mg/dL (ref 2.0–4.5)

## 2021-01-30 LAB — MAGNESIUM: MAGNESIUM: 1.5 mg/dL — ABNORMAL LOW (ref 1.6–2.6)

## 2021-01-30 LAB — COMPREHENSIVE METABOLIC PANEL
POTASSIUM: 4.2 MMOL/L (ref 3.5–5.1)
SODIUM: 144 MMOL/L (ref 137–147)

## 2021-01-30 LAB — TACROLIMUS LC-MS/MS: TACROLIMUS LC-MS/MS: 10

## 2021-02-05 ENCOUNTER — Encounter: Admit: 2021-02-05 | Discharge: 2021-02-05 | Payer: MEDICARE

## 2021-02-05 DIAGNOSIS — Z940 Kidney transplant status: Secondary | ICD-10-CM

## 2021-02-05 MED ORDER — TACROLIMUS 1 MG PO CAP
1 mg | ORAL_CAPSULE | Freq: Two times a day (BID) | ORAL | 3 refills | 30.00000 days | Status: AC
Start: 2021-02-05 — End: ?

## 2021-02-05 NOTE — Telephone Encounter
Reviewed labs from 01/30/2021 at this time    Hgb 10.5  WBC 4.7  Creatinine  1.76  Mg 1.5  Phos 3.1  Tacrolimus 10.0 (Goal ~8)  UPC 0.3  A1C 6.2  BK not detected  CMV not detected    Received the following orders from Dr. Lyndel Safe :  Reduce tacrolimus to 1 mg twice daily     Reviewed labs with patient at this time via mychart.   Gave dose change to patient at this time and asked for message back to confirm. Medication list updated

## 2021-02-15 ENCOUNTER — Encounter: Admit: 2021-02-15 | Discharge: 2021-02-15 | Payer: MEDICARE

## 2021-02-16 ENCOUNTER — Encounter: Admit: 2021-02-16 | Discharge: 2021-02-16 | Payer: MEDICARE

## 2021-02-16 MED FILL — CALCITRIOL 0.25 MCG PO CAP: 0.25 mcg | ORAL | 90 days supply | Qty: 90 | Fill #3 | Status: AC

## 2021-02-16 MED FILL — PEN NEEDLE, DIABETIC 32 GAUGE X 5/32" MISC NDLE: 32 gauge x 5/" | 60 days supply | Qty: 300 | Fill #3 | Status: AC

## 2021-02-16 MED FILL — MYCOPHENOLATE SODIUM 180 MG PO TBEC: 180 mg | ORAL | 30 days supply | Qty: 240 | Fill #8 | Status: AC

## 2021-02-17 ENCOUNTER — Encounter: Admit: 2021-02-17 | Discharge: 2021-02-17 | Payer: MEDICARE

## 2021-02-18 ENCOUNTER — Encounter: Admit: 2021-02-18 | Discharge: 2021-02-18 | Payer: MEDICARE

## 2021-02-19 ENCOUNTER — Encounter: Admit: 2021-02-19 | Discharge: 2021-02-19 | Payer: MEDICARE

## 2021-02-20 ENCOUNTER — Ambulatory Visit: Admit: 2021-02-20 | Discharge: 2021-02-20 | Payer: MEDICARE

## 2021-02-20 ENCOUNTER — Encounter: Admit: 2021-02-20 | Discharge: 2021-02-20 | Payer: MEDICARE

## 2021-02-20 DIAGNOSIS — Z940 Kidney transplant status: Secondary | ICD-10-CM

## 2021-02-20 DIAGNOSIS — D849 Immunodeficiency, unspecified: Secondary | ICD-10-CM

## 2021-02-20 DIAGNOSIS — E1121 Type 2 diabetes mellitus with diabetic nephropathy: Secondary | ICD-10-CM

## 2021-02-20 DIAGNOSIS — Z79899 Other long term (current) drug therapy: Secondary | ICD-10-CM

## 2021-02-20 LAB — COMPREHENSIVE METABOLIC PANEL
POTASSIUM: 3.9 MMOL/L (ref 3.5–5.1)
SODIUM: 145 MMOL/L (ref 137–147)

## 2021-02-20 LAB — URINALYSIS MICROSCOPIC REFLEX TO CULTURE

## 2021-02-20 LAB — CBC AND DIFF
ABSOLUTE BASO COUNT: 0 K/UL (ref 0–0.20)
ABSOLUTE EOS COUNT: 0 K/UL (ref 0–0.45)
ABSOLUTE LYMPH COUNT: 0.3 K/UL — ABNORMAL LOW (ref 1.0–4.8)
ABSOLUTE MONO COUNT: 0.4 K/UL (ref 0–0.80)
ABSOLUTE NEUTROPHIL: 6.6 K/UL (ref 1.8–7.0)
BASOPHILS %: 0 % (ref 0–2)
EOSINOPHILS %: 1 % — ABNORMAL LOW (ref >60)
LYMPHOCYTES %: 5 % — ABNORMAL LOW (ref 24–44)
MCH: 27 pg (ref 26–34)
MCHC: 32 g/dL (ref 32.0–36.0)
MONOCYTES %: 6 % (ref 4–12)
PLATELET COUNT: 212 K/UL (ref 150–400)
RBC COUNT: 3.7 M/UL — ABNORMAL LOW (ref 4.0–5.0)
RDW: 14 % (ref 11–15)
WBC COUNT: 7.6 K/UL — ABNORMAL HIGH (ref 4.5–11.0)

## 2021-02-20 LAB — URINALYSIS DIPSTICK REFLEX TO CULTURE
GLUCOSE,UA: NEGATIVE
PROTEIN,UA: NEGATIVE
URINE PH: 6 (ref 5.0–8.0)

## 2021-02-20 LAB — PROTEIN/CR RATIO,UR RAN
UR CREATININE, RAN: 48 mg/dL
UR TOTAL PROTEIN,RAN: 16 mg/dL

## 2021-02-20 LAB — PHOSPHORUS: PHOSPHORUS: 3.2 mg/dL — ABNORMAL HIGH (ref <0.15)

## 2021-02-20 LAB — URIC ACID: URIC ACID: 6.1 mg/dL (ref 2.0–7.0)

## 2021-02-20 LAB — HEMOGLOBIN A1C: HEMOGLOBIN A1C: 6.9 % — ABNORMAL HIGH (ref 4.0–6.0)

## 2021-02-20 LAB — MAGNESIUM: MAGNESIUM: 1.5 mg/dL — ABNORMAL LOW (ref 1.6–2.6)

## 2021-02-22 ENCOUNTER — Encounter: Admit: 2021-02-22 | Discharge: 2021-02-22 | Payer: MEDICARE

## 2021-02-23 ENCOUNTER — Encounter: Admit: 2021-02-23 | Discharge: 2021-02-23 | Payer: MEDICARE

## 2021-02-23 MED ORDER — CEPHALEXIN 500 MG PO CAP
500 mg | ORAL_CAPSULE | Freq: Two times a day (BID) | ORAL | 0 refills | Status: AC
Start: 2021-02-23 — End: ?

## 2021-02-23 NOTE — Telephone Encounter
Reviewed labs from 02/20/2021 at this time    Hgb 10.2  WBC 7.6  Creatinine  1.79  Mg 1.5  Phos 3.2  Tacrolimus  5.3 (Goal 5-10)  UPC 0.3  A1c 6.9    Urine culture shows >100,000 E Coli patient reports burning with urination and pressure to abdomen    Received the following orders from Dr. Lyndel Safe :  Keflex 500 mg twice daily x 7 days for UTI (sent to [facility] [place])    Reviewed labs with patient at this time via mychart.   Advised of new medication to pickup and start.

## 2021-03-06 ENCOUNTER — Encounter: Admit: 2021-03-06 | Discharge: 2021-03-06 | Payer: MEDICARE

## 2021-03-06 ENCOUNTER — Ambulatory Visit: Admit: 2021-03-06 | Discharge: 2021-03-06 | Payer: MEDICARE

## 2021-03-06 DIAGNOSIS — Z79899 Other long term (current) drug therapy: Secondary | ICD-10-CM

## 2021-03-06 DIAGNOSIS — Z940 Kidney transplant status: Secondary | ICD-10-CM

## 2021-03-06 DIAGNOSIS — D849 Immunodeficiency, unspecified: Secondary | ICD-10-CM

## 2021-03-06 LAB — CBC AND DIFF
ABSOLUTE LYMPH COUNT: 0.5 K/UL — ABNORMAL LOW (ref 1.0–4.8)
ABSOLUTE MONO COUNT: 0.7 K/UL (ref 0–0.80)
ABSOLUTE NEUTROPHIL: 8.7 K/UL — ABNORMAL HIGH (ref 1.8–7.0)
BASOPHILS %: 0 % (ref 0–2)
EOSINOPHILS %: 1 % — ABNORMAL LOW (ref >60)
HEMATOCRIT: 34 % — ABNORMAL LOW (ref 36–45)
HEMOGLOBIN: 11 g/dL — ABNORMAL LOW (ref 12.0–15.0)
LYMPHOCYTES %: 5 % — ABNORMAL LOW (ref 24–44)
MCH: 27 pg (ref 26–34)
MCHC: 32 g/dL (ref 32.0–36.0)
MCV: 83 FL — ABNORMAL LOW (ref 80–100)
MONOCYTES %: 7 % — ABNORMAL HIGH (ref 4–12)
MPV: 8.9 FL (ref 7–11)
NEUTROPHILS %: 87 % — ABNORMAL HIGH (ref 41–77)
PLATELET COUNT: 224 K/UL (ref 150–400)
RBC COUNT: 4.1 M/UL — ABNORMAL HIGH (ref 4.0–5.0)
RDW: 14 % (ref 11–15)
WBC COUNT: 10 K/UL (ref 4.5–11.0)

## 2021-03-06 LAB — URINALYSIS DIPSTICK REFLEX TO CULTURE: URINE PH: 6 (ref 5.0–8.0)

## 2021-03-06 LAB — PROTEIN/CR RATIO,UR RAN
UR CREATININE, RAN: 97 mg/dL
UR TOTAL PROTEIN,RAN: 147 mg/dL

## 2021-03-06 LAB — COMPREHENSIVE METABOLIC PANEL
POTASSIUM: 4.3 MMOL/L (ref 3.5–5.1)
SODIUM: 141 MMOL/L (ref 137–147)

## 2021-03-06 LAB — URINALYSIS MICROSCOPIC REFLEX TO CULTURE

## 2021-03-06 LAB — URIC ACID: URIC ACID: 5.5 mg/dL (ref 2.0–7.0)

## 2021-03-06 LAB — MAGNESIUM: MAGNESIUM: 1.3 mg/dL — ABNORMAL LOW (ref 1.6–2.6)

## 2021-03-06 LAB — PHOSPHORUS: PHOSPHORUS: 2.8 mg/dL — ABNORMAL HIGH (ref <0.15)

## 2021-03-06 MED ORDER — TACROLIMUS 1 MG PO CAP
2 mg | ORAL_CAPSULE | Freq: Two times a day (BID) | ORAL | 3 refills
Start: 2021-03-06 — End: ?

## 2021-03-08 ENCOUNTER — Encounter: Admit: 2021-03-08 | Discharge: 2021-03-08 | Payer: MEDICARE

## 2021-03-08 DIAGNOSIS — N390 Urinary tract infection, site not specified: Secondary | ICD-10-CM

## 2021-03-08 MED ORDER — CEPHALEXIN 500 MG PO CAP
500 mg | ORAL_CAPSULE | Freq: Two times a day (BID) | ORAL | 0 refills | Status: AC
Start: 2021-03-08 — End: ?

## 2021-03-08 NOTE — Telephone Encounter
Reviewed labs from 03/06/2021 at this time    Hgb 11.4  WBC 10.1  Creatinine  2.14  Mg 1.3  Phos 2.8   Tacrolimus 8.6 (Goal ~8)  UPC 1.5  Urine culture shows   >100,000 CFU/ml   ESCHERICHIA COLI     Received the following orders from Dr. Lyndel Safe :  Keflex 500 mg twice daily x 7 days for UTI   Referral to Infectious disease and urology for frequent UTIs    Reviewed labs with patient at this time via mychart. Advised of new medication and referrals

## 2021-03-09 ENCOUNTER — Encounter: Admit: 2021-03-09 | Discharge: 2021-03-09 | Payer: MEDICARE

## 2021-03-09 DIAGNOSIS — Z940 Kidney transplant status: Secondary | ICD-10-CM

## 2021-03-09 MED ORDER — TACROLIMUS 1 MG PO CAP
1 mg | ORAL_CAPSULE | Freq: Two times a day (BID) | ORAL | 3 refills | 30.00000 days | Status: AC
Start: 2021-03-09 — End: ?
  Filled 2021-03-10: qty 60, 30d supply, fill #1

## 2021-03-09 MED FILL — PEN NEEDLE, DIABETIC 32 GAUGE X 5/32" MISC NDLE: 32 gauge x 5/" | 60 days supply | Qty: 300 | Fill #3 | Status: AC

## 2021-03-09 MED FILL — CALCITRIOL 0.25 MCG PO CAP: 0.25 mcg | ORAL | 90 days supply | Qty: 90 | Fill #3 | Status: AC

## 2021-03-10 ENCOUNTER — Encounter: Admit: 2021-03-10 | Discharge: 2021-03-10 | Payer: MEDICARE

## 2021-03-15 ENCOUNTER — Encounter: Admit: 2021-03-15 | Discharge: 2021-03-15 | Payer: MEDICARE

## 2021-03-15 MED FILL — MYCOPHENOLATE SODIUM 180 MG PO TBEC: 180 mg | ORAL | 30 days supply | Qty: 240 | Fill #9 | Status: AC

## 2021-03-17 ENCOUNTER — Ambulatory Visit: Admit: 2021-03-17 | Discharge: 2021-03-17 | Payer: MEDICARE

## 2021-03-17 ENCOUNTER — Encounter: Admit: 2021-03-17 | Discharge: 2021-03-17 | Payer: MEDICARE

## 2021-03-17 DIAGNOSIS — Z79899 Other long term (current) drug therapy: Secondary | ICD-10-CM

## 2021-03-17 DIAGNOSIS — Z940 Kidney transplant status: Secondary | ICD-10-CM

## 2021-03-17 DIAGNOSIS — D849 Immunodeficiency, unspecified: Secondary | ICD-10-CM

## 2021-03-17 LAB — URINALYSIS DIPSTICK REFLEX TO CULTURE
LEUKOCYTES: NEGATIVE
NITRITE: NEGATIVE
URINE ASCORBIC ACID, UA: NEGATIVE
URINE BILE: NEGATIVE
URINE BLOOD: NEGATIVE
URINE KETONE: NEGATIVE
URINE SPEC GRAVITY: 1 K/UL (ref 1.005–1.030)

## 2021-03-17 LAB — CBC AND DIFF
ABSOLUTE BASO COUNT: 0 K/UL (ref 0–0.20)
ABSOLUTE EOS COUNT: 0.1 K/UL (ref 0–0.45)
ABSOLUTE LYMPH COUNT: 0.6 K/UL — ABNORMAL LOW (ref 1.0–4.8)
MPV: 9.2 FL (ref 7–11)
NEUTROPHILS %: 76 % (ref 41–77)
RBC COUNT: 4.1 M/UL (ref 4.0–5.0)
RDW: 14 % — ABNORMAL HIGH (ref 11–15)
WBC COUNT: 5.5 K/UL (ref >60)

## 2021-03-17 LAB — URINALYSIS MICROSCOPIC REFLEX TO CULTURE

## 2021-03-17 LAB — COMPREHENSIVE METABOLIC PANEL
ALBUMIN: 3.9 g/dL — ABNORMAL LOW (ref 3.5–5.0)
ALK PHOSPHATASE: 91 U/L (ref 25–110)
AST: 22 U/L (ref 7–40)
BLD UREA NITROGEN: 35 mg/dL — ABNORMAL HIGH (ref 7–25)
CHLORIDE: 109 MMOL/L (ref 98–110)
CO2: 22 MMOL/L (ref 21–30)
EGFR: 25 mL/min — ABNORMAL LOW (ref >60)
GLUCOSE,PANEL: 96 mg/dL (ref 70–100)
POTASSIUM: 4.4 MMOL/L — ABNORMAL LOW (ref 3.5–5.1)
SODIUM: 142 MMOL/L — ABNORMAL LOW (ref 137–147)

## 2021-03-17 LAB — URIC ACID: URIC ACID: 6.1 mg/dL (ref 2.0–7.0)

## 2021-03-17 LAB — PROTEIN/CR RATIO,UR RAN
PROT CREAT RAT/CAL: 0.4 % — ABNORMAL HIGH (ref <0.15)
UR TOTAL PROTEIN,RAN: 33 mg/dL — AB (ref 26–34)

## 2021-03-17 MED ORDER — MYCOPHENOLATE SODIUM 180 MG PO TBEC
540 mg | ORAL_TABLET | Freq: Two times a day (BID) | ORAL | 3 refills | Status: AC
Start: 2021-03-17 — End: ?
  Filled 2021-03-17: qty 180, 30d supply, fill #1

## 2021-03-17 NOTE — Progress Notes
Stop magnesium r/t diarrhea  Draw labs today no tac  Increase magnesium in diet  Just finished kelfex regime last night     Medication changes: stop magnesium                                     Reduce myfortic to 540 mg BID     Increase magnesium in your diet  Go see ID and urology

## 2021-03-17 NOTE — Progress Notes
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  [name]   [place] [address]  Phone: [phone]  Fax: [phone]     Dear Dr. Elmer Bales,    We had the pleasure of meeting Ms. Fillingim in the [facility] for routine evaluation of her renal transplant.  She is doing well. Completed keflex last night. BP WNL 120/70s. UOP good, good PO. Denies pain , no change in appetite, denies blood in urine. On quite high dose of lantus 70 units BID. Works full time in [employer].   She denies N/V/D, fever or chills. Reports compliance with her immunosuppression regimen.   She denies ER visits, hospital admissions or infections since last seen.    REVIEW OF SYSTEMS: Comprehensive 14-point ROS reviewed  Positives noted in HPI otherwise negative.    Past History:  Medical History:   Diagnosis Date   ? Abnormal biopsy of kidney    ? Anemia    ? 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 Diabetes mellitus (HCC)    ? ESRD (end stage renal disease) (HCC)    ? Hypertension    ? Kidney failure    ? MGUS (monoclonal gammopathy of unknown significance)    ? Proteinuria        Surgical History:   Procedure Laterality Date   ? ANGIOGRAPHY CORONARY ARTERY WITH LEFT HEART CATHETERIZATION N/A 07/03/2020    Performed by Harley Alto, MD at [facility] CATH LAB   ? POSSIBLE PERCUTANEOUS CORONARY STENT PLACEMENT WITH ANGIOPLASTY N/A 07/03/2020    Performed by Harley Alto, MD at [facility] CATH LAB   ? ALLOTRANSPLANTATION KIDNEY FROM NON LIVING DONOR WITHOUT RECIPIENT NEPHRECTOMY N/A 07/15/2020    Performed by Nicholos Johns, MD at [facility] OR   ? REMOVAL TUNNELED INTRAPERITONEAL CATHETER Bilateral 08/24/2020    Performed by Nicholos Johns, MD at [facility] OR   ? CYSTOURETHROSCOPY WITH REMOVAL FOREIGN BODY/ CALCULUS/ STENT FROM URETHRA/ BLADDER - SIMPLE Right 08/24/2020    Performed by Marylen Ponto, MD at [facility] OR   ? CATHETER IMPLANT/REVISION      PD cath   ? HX HYSTERECTOMY     ? HX LITHOTRIPSY         Social History     Socioeconomic History   ? Marital status: Married     Spouse name: Fredrik Cove   ? Number of children: 2   Tobacco Use   ? Smoking status: Never Smoker   ? Smokeless tobacco: Never Used   Vaping Use   ? Vaping Use: Never used   Substance and Sexual Activity   ? Alcohol use: Not Currently   ? Drug use: Never       Family History   Problem Relation Age of Onset   ? Diabetes Mother    ? Hypertension Mother    ? Diabetes Father    ?  Hypertension Father    ? Cancer-Hematologic Father    ? Diabetes Sister    ? Migraines Sister    ? Cancer Sister         Endometrial       No Known Allergies    Current Medications:    Current Outpatient Medications:   ?  aspirin EC (ASPIR-LOW) 81 mg tablet, Take one tablet by mouth daily. Take with food., Disp: 90 tablet, Rfl: 3  ?  blood sugar diagnostic (ONETOUCH VERIO TEST STRIPS) test strip, Use one strip as directed before meals and at bedtime. ICD-10: Type II Diabetes with unspecified complications; with long term insulin use E11.8, Z79.4  Indications: type 2 diabetes mellitus, Disp: 300 strip, Rfl: 3  ?  calcitrioL (ROCALTROL) 0.25 mcg capsule, Take one capsule by mouth daily., Disp: 90 capsule, Rfl: 3  ?  cholecalciferol (VITAMIN D-3) 50 mcg (2,000 unit) tablet, Take one tablet by mouth daily., Disp: 90 tablet, Rfl: 3  ?  ferrous sulfate (FEOSOL) 325 mg (65 mg iron) tablet, Take one tablet by mouth at bedtime daily. Take on an empty stomach at least 1 hour before or 2 hours after food., Disp: 30 tablet, Rfl: 3  ?  insulin aspart (U-100) (NOVOLOG FLEXPEN U-100 INSULIN) 100 unit/mL (3 mL) PEN, Follow Novolog mealtime dose taper to match prednisone taper.  Once off of prednisone, continue 5 units with meals and add 0-14 units per correction scale.  Max 84 units/day (Patient taking differently: 20 Units. Follow Novolog mealtime dose taper to match prednisone taper.  Once off of prednisone, continue 5 units with meals and add 0-14 units per correction scale.  Max 84 units/day), Disp: 45 mL, Rfl: 1  ?  insulin glargine (LANTUS SOLOSTAR U-100 INSULIN) 100 unit/mL (3 mL) subcutaneous PEN, Inject 65 Units under the skin twice daily. (Patient taking differently: Inject 75 Units under the skin twice daily.), Disp: 45 mL, Rfl: 1  ?  lancets 33 gauge (ONETOUCH DELICA 33 GUAGE) 33 gauge, Use one each as directed four times daily. ICD-10: Type II Diabetes with unspecified complications; with long term insulin use E11.8, Z79.4, Disp: 300 each, Rfl: 3  ?  mycophenolate DR (MYFORTIC) 180 mg TbEC tablet, Take three tablets by mouth twice daily., Disp: 540 tablet, Rfl: 3  ?  pen needle, diabetic (BD ULTRA-FINE NANO PEN NEEDLE) 32 gauge x 5/32 pen needle, Use one each as directed as Needed. Use with insulin injections., Disp: 300 each, Rfl: 3  ?  rosuvastatin (CRESTOR) 20 mg tablet, Take one tablet by mouth daily., Disp: 90 tablet, Rfl: 1  ?  tacrolimus (PROGRAF) 1 mg capsule, Take one capsule by mouth twice daily., Disp: 180 capsule, Rfl: 3    Physical Exam:  Vitals:    03/17/21 1032 03/17/21 1037   BP: 106/73 104/60   BP Source: Arm, Right Upper Arm, Right Upper   Pulse: 74 83   Temp: 36.4 ?C (97.6 ?F)    SpO2: 99%    PF: 106 L/min    TempSrc: Oral    PainSc: Zero    Weight: 106.6 kg (235 lb 0.3 oz)    Height: 172.7 cm (5' 8)    Body mass index is 35.73 kg/m?Marland Kitchen     General: NAD, A+Ox4, calm and pleasant. Appears to be stated age.   HENT: Unremarkable, no oral lesions, wearing mask  Neck: Normal ROM, no LAD, no JVD  Lungs: Bilat. CTA  CV: RRR, S1, S2 without carotid bruit or murmur, no  edema  Abdomen: Soft, N/D, N/T without hepatosplenomegaly,  central obesity with mild pannus  Incision: RLQ Healed  M/S: Normal ROM and strength  Neuro: Nonfocal deficits without tremors  Skin: No skin rash  Psyc: Stable  Laboratory studies:     CMP:  CMP Latest Ref Rng & Units 03/17/2021 03/06/2021 02/20/2021 01/30/2021 01/16/2021   NA 137 - 147 MMOL/L 142 141 145 144 142   K 3.5 - 5.1 MMOL/L 4.4 4.3 3.9 4.2 4.1   CL 98 - 110 MMOL/L 109 106 112(H) 110 112(H)   CO2 21 - 30 MMOL/L 22 22 23 23  20(L)   GAP 3 - 12 11 13(H) 10 11 10    BUN 7 - 25 MG/DL 16(X) 21 24 23  28(H)   CR 0.4 - 1.00 MG/DL 0.96(E) 4.54(U) 9.81(X) 1.76(H) 2.20(H)   GLUX 70 - 100 MG/DL 96 914(N) 829(F) 621(H) 108(H)   CA 8.5 - 10.6 MG/DL 8.8 0.8(M) 5.7(Q) 8.5 4.6(N)   TP 6.0 - 8.0 G/DL 6.5 6.8 6.0 6.2 6.3   ALB 3.5 - 5.0 G/DL 3.9 3.9 3.7 3.8 4.1   ALKP 25 - 110 U/L 91 88 68 80 72   ALT 7 - 56 U/L 32 31 21 20 19    TBILI 0.3 - 1.2 MG/DL 0.3 0.4 0.3 0.3 0.3   GFR >60 mL/min - - - - -   GFRAA >60 mL/min - - - - -     Hemoglobin A1C (%)   Date Value   02/20/2021 6.9 (H)   01/30/2021 6.2 (H)   10/14/2020 10.5 (H)     PTH Hormone (PG/ML)   Date Value   10/14/2020 96.1 (H)   07/15/2020 272.3 (H)     No results found for: LIPASE  No results found for: AMY  BK Virus Plasma Quant (no units)   Date Value   03/06/2021     NOT DETECTED  Reference range: NOT DETECTED  Unit: IU/mL  TESTING PERFORMED AT LOW VOLUME ON PLASMA SPECIMEN FOR BKV, MAY  AFFECT RESULTS.  Marland Kitchen  Assay Range: 33 IU/mL to 3.30E+08 IU/mL  .  One IU is equal to 0.33 copies of BKV.  .  The limit of quantitation (LOQ) is 33 IU/mL. BK virus DNA detected  below the LOQ  will be reported as Detected:<33 IU/mL.  .  This test was developed and its performance characteristics  determined by  [facility]. It has not been cleared or approved by the U.S.  Food and Drug  Administration. Results should be used in conjunction with clinical  findings,  and should not form the sole basis for a diagnosis or treatment  decision.  ____________________________________________________________  Testing Performed At:  [facility]  [address]  [place], [place] [address]  Laboratory Director: Lind Guest Ph.D., BCLD (ABB)  CLIA#: [id number]  Phone: [phone]     01/30/2021     NOT DETECTED  Reference range: NOT DETECTED  Unit: IU/mL  TESTING PERFORMED AT LOW VOLUME ON PLASMA SPECIMEN FOR BKV , MAY  AFFECT RESULTS.  Marland Kitchen  Assay Range: 33 IU/mL to 3.30E+08 IU/mL  .  One IU is equal to 0.33 copies of BKV.  .  The limit of quantitation (LOQ) is 33 IU/mL. BK virus DNA detected  below the LOQ  will be reported as Detected:<33 IU/mL.  .  This test was developed and its performance characteristics  determined by  [facility]. It has not been cleared or approved by the U.S.  Food  and Drug  Administration. Results should be used in conjunction with clinical  findings,  and should not form the sole basis for a diagnosis or treatment  decision.  ____________________________________________________________  Testing Performed At:  [facility]  [address]  [place], [place] [address]  Laboratory Director: Lind Guest Ph.D., BCLD (ABB)  CLIA#: [id number]  Phone: [phone]     10/22/2020     NOT DETECTED  Reference range: NOT DETECTED  Unit: copies/mL  .  Assay Range: 39 copies/mL to 1.00E+10 copies/mL  .  The limit of quantitation (LOQ) is 39 copies/mL. BK virus DNA  detected below the  LOQ will be reported as Detected:<39 copies/mL.  .  This test was developed and its performance characteristics  determined by  [facility]. It has not been cleared or approved by the U.S.  Food and Drug  Administration. Results should be used in conjunction with clinical  findings,  and should not form the sole basis for a diagnosis or treatment  decision.  ____________________________________________________________  Testing Performed At:  [facility]  [address]  [place] [address]  Laboratory Director: Lind Guest Ph.D., BCLD (ABB)  CLIA#: [id number]  Phone: [phone]     08/26/2020     NOT DETECTED  Reference range: NOT DETECTED  Unit: copies/mL  TESTING PERFORMED AT LOW VOLUME ON PLASMA SPECIMEN FOR BKV, MAY  AFFECT RESULTS.  Marland Kitchen  Assay Range: 39 copies/mL to 1.00E+10 copies/mL  .  The limit of quantitation (LOQ) is 39 copies/mL. BK virus DNA  detected below the  LOQ will be reported as Detected:<39 copies/mL.  .  This test was developed and its performance characteristics  determined by  [facility]. It has not been cleared or approved by the U.S.  Food and Drug  Administration. Results should be used in conjunction with clinical  findings,  and should not form the sole basis for a diagnosis or treatment  decision.  ____________________________________________________________  Testing Performed At:  [facility]  [address]  [place] [address]  Laboratory Director: Lind Guest Ph.D., BCLD (ABB)  CLIA#: [id number]  Phone: [phone]     08/19/2020 BK Virus Not Detected     CMV DNA Quant PCR ([IU]/mL)   Date Value   03/06/2021 CMV DNA NOT DETECTED   01/30/2021 CMV DNA NOT DETECTED   10/19/2020 CMV DNA NOT DETECTED     IU/mL CMV Blood (no units)   Date Value   03/06/2021     <50 IU/mL  The test method detects and quantitates CMV DNA using the Abbott RealTime assay,   and is approved by the FDA for monitoring hematopoietic stem cell transplant   patients who are undergoing anti-CMV therapy.  Please correlate results with the   clinical status of the patient.     01/30/2021     <50 IU/mL  The test method detects and quantitates CMV DNA using the Abbott RealTime assay,   and is approved by the FDA for monitoring hematopoietic stem cell transplant   patients who are undergoing anti-CMV therapy.  Please correlate results with the   clinical status of the patient.     10/19/2020     <50 IU/mL  The test method detects and quantitates CMV DNA using the Abbott RealTime assay,   and is approved by the FDA for monitoring hematopoietic stem cell transplant   patients who are undergoing anti-CMV therapy.  Please correlate results with the   clinical status of  the patient.       No results found for: COPIES  No results found for: EBVDNAQT    TACROLIMUS LEVEL:  No results found for: TACROLIMUS    CBC with Diff:  CBC with Diff Latest Ref Rng & Units 03/17/2021 03/06/2021   WBC 4.5 - 11.0 K/UL 5.5 10.1   RBC 4.0 - 5.0 M/UL 4.18 4.19   HGB 12.0 - 15.0 GM/DL 11.4(L) 11.4(L)   HCT 36 - 45 % 35.5(L) 34.9(L)   MCV 80 - 100 FL 85.0 83.3   MCH 26 - 34 PG 27.3 27.3   MCHC 32.0 - 36.0 G/DL 16.1 09.6   RDW 11 - 15 % 14.4 14.6   PLT 150 - 400 K/UL 267 224   MPV 7 - 11 FL 9.2 8.9   NEUT 41 - 77 % 76 87(H)   ANC 1.8 - 7.0 K/UL 4.20 8.70(H)   LYMA 24 - 44 % 11(L) 5(L)   ALYM 1.0 - 4.8 K/UL 0.60(L) 0.50(L)   MONA 4 - 12 % 10 7   AMONO 0 - 0.80 K/UL 0.55 0.73   EOSA 0 - 5 % 2 1   AEOS 0 - 0.45 K/UL 0.10 0.11   BASA 0 - 2 % 1 0   ABAS 0 - 0.20 K/UL 0.04 0.02     Lab Results   Component Value Date/Time    IRON 58 10/14/2020 11:16 AM    TIBC 273 10/14/2020 11:16 AM    PSAT 21 (L) 10/14/2020 11:16 AM    FERRITIN 699 (H) 10/14/2020 11:16 AM    FERRITIN 719 (H) 07/17/2020 06:06 AM       Urinalysis:  Lab Results   Component Value Date/Time    UCOLOR YELLOW 03/17/2021 11:23 AM    TURBID CLEAR 03/17/2021 11:23 AM    USPGR 1.015 03/17/2021 11:23 AM    UPH 5.0 03/17/2021 11:23 AM    UPROTEIN NEG 03/17/2021 11:23 AM    UAGLU 1+ (A) 03/17/2021 11:23 AM    UKET NEG 03/17/2021 11:23 AM    UBILE NEG 03/17/2021 11:23 AM    UBLD NEG 03/17/2021 11:23 AM    UROB NORMAL 03/17/2021 11:23 AM     Protein/CR ratio (no units)   Date Value   03/17/2021 0.4 (H)   03/06/2021 1.5 (H)   02/20/2021 0.3 (H)   01/30/2021 0.3 (H)   01/16/2021 0.2 (H)       Imaging:    Results for orders placed during the hospital encounter of 07/29/20    CHEST 2 VIEWS    Impression  No evidence of acute cardiopulmonary disease.      Finalized by Arlana Hove, M.D. on 07/29/2020 12:15 PM. Dictated by Arlana Hove, M.D. on 07/29/2020 12:14 PM.    Assessment and Plan: labs rev'd with pt from 01/16/2021  Ms. 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 Aim for goal of 4-8.  - steroid minimization due to poorly controlled DM and 0% PRA, prednisone stopped at transplant discharge, to remain off prednisone  - Allosure <0.12% on 09/30/20  - 09/30/20 DSA negative    #. HTN: good control  - Not on medication.  ?  # ID:  - CMV D-/R- acyclovir x 3 months post tx completed  - PJP ppx bactrim x 6 months-completed  - rec'd Evusheld for preventative COVID-19 measures in immunocompromised pt  - CMV, BK -ve   - Stop mag for diarrhea  ?  # Anemia: normocytic  - Hgb ~10  - Fe 58, tsat 21% iron insufficient, cont PO Fe supplement  ?  #. 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  # CAD s/p cath 07/03/20: superior branch LAD 80% disease, RCA 40-50% mid RCA stenosis at/around origin RV marginal branch; RV marginal branch also has ~80% stenosis; guide directed medical therapy, risk factor optimization  -On aspirin, statin, not on beta-blocker- will consider in near future, depending on blood pressure/.    #) Health maintenance by PCP   Skin cancer surveillance: Annual follow up with Dermatology for skin eval due to long term immunosuppression   Cancer screening: Mammogram, Pap Smear, colonoscopy per guidelines   Annual high dose flu vaccine    Avoid all live vaccines. When clinically indicated or age appropriate, can receive the Shingrix vaccine one year post transplant.      Fully vaccinated for COVID-19 [Moderna] x 2 doses. 3rd dose today.    RTC 2 months   Labs 2 weeks.     Corena Herter, MD  Transplant Nephrology     Cc: Elmer Bales  Cc: Leron Croak    Please contact the [facility] at [phone] for any transplant related questions or concerns that may arise.

## 2021-03-18 ENCOUNTER — Encounter: Admit: 2021-03-18 | Discharge: 2021-03-18 | Payer: MEDICARE

## 2021-03-19 ENCOUNTER — Encounter: Admit: 2021-03-19 | Discharge: 2021-03-19 | Payer: MEDICARE

## 2021-03-19 NOTE — Telephone Encounter
Reviewed labs from 03/17/2021 at this time    Hgb 11.4  WBC 5.5  Creatinine  2.23  Mg 1.6  Phos 3.9  Tacrolimus not drawn patient took meds   UPC 0.4      Reviewed labs with patient at this time via mychart. Advised patient to increase hydration

## 2021-03-25 ENCOUNTER — Encounter: Admit: 2021-03-25 | Discharge: 2021-03-25 | Payer: MEDICARE

## 2021-04-03 ENCOUNTER — Encounter: Admit: 2021-04-03 | Discharge: 2021-04-03 | Payer: MEDICARE

## 2021-04-03 ENCOUNTER — Ambulatory Visit: Admit: 2021-04-03 | Discharge: 2021-04-03 | Payer: MEDICARE

## 2021-04-03 DIAGNOSIS — D849 Immunodeficiency, unspecified: Secondary | ICD-10-CM

## 2021-04-03 DIAGNOSIS — Z940 Kidney transplant status: Secondary | ICD-10-CM

## 2021-04-03 DIAGNOSIS — Z79899 Other long term (current) drug therapy: Secondary | ICD-10-CM

## 2021-04-03 LAB — CBC AND DIFF
ABSOLUTE BASO COUNT: 0 K/UL (ref 0–0.20)
ABSOLUTE EOS COUNT: 0.1 K/UL (ref 0–0.45)
ABSOLUTE LYMPH COUNT: 0.3 K/UL — ABNORMAL LOW (ref 1.0–4.8)
ABSOLUTE MONO COUNT: 0.5 K/UL (ref 0–0.80)
ABSOLUTE NEUTROPHIL: 5.9 K/UL (ref 1.8–7.0)
BASOPHILS %: 1 % (ref 0–2)
EOSINOPHILS %: 2 % — ABNORMAL LOW (ref >60)
HEMATOCRIT: 34 % — ABNORMAL LOW (ref 36–45)
HEMOGLOBIN: 11 g/dL — ABNORMAL LOW (ref 12.0–15.0)
LYMPHOCYTES %: 5 % — ABNORMAL LOW (ref 24–44)
MCH: 26 pg (ref 26–34)
MCHC: 32 g/dL (ref 32.0–36.0)
MCV: 82 FL (ref 80–100)
MONOCYTES %: 8 % — ABNORMAL HIGH (ref 4–12)
MPV: 8.8 FL (ref 7–11)
NEUTROPHILS %: 84 % — ABNORMAL HIGH (ref 41–77)
PLATELET COUNT: 273 K/UL (ref 150–400)
RBC COUNT: 4.1 M/UL — ABNORMAL HIGH (ref 4.0–5.0)
RDW: 14 % (ref 11–15)
WBC COUNT: 7.1 K/UL (ref 4.5–11.0)

## 2021-04-03 LAB — URINALYSIS DIPSTICK REFLEX TO CULTURE
GLUCOSE,UA: NEGATIVE
URINE BILE: NEGATIVE
URINE KETONE: NEGATIVE
URINE PH: 6 (ref 5.0–8.0)

## 2021-04-03 LAB — MAGNESIUM: MAGNESIUM: 1.5 mg/dL — ABNORMAL LOW (ref 1.6–2.6)

## 2021-04-03 LAB — COMPREHENSIVE METABOLIC PANEL
POTASSIUM: 3.9 MMOL/L (ref 3.5–5.1)
SODIUM: 143 MMOL/L (ref 137–147)

## 2021-04-03 LAB — PHOSPHORUS: PHOSPHORUS: 3.1 mg/dL (ref 2.0–4.5)

## 2021-04-03 LAB — URINALYSIS MICROSCOPIC REFLEX TO CULTURE

## 2021-04-03 LAB — URIC ACID: URIC ACID: 5.2 mg/dL — AB (ref 2.0–7.0)

## 2021-04-06 ENCOUNTER — Encounter: Admit: 2021-04-06 | Discharge: 2021-04-06 | Payer: MEDICARE

## 2021-04-06 MED FILL — TACROLIMUS 1 MG PO CAP: 1 mg | ORAL | 30 days supply | Qty: 60 | Fill #2 | Status: AC

## 2021-04-08 ENCOUNTER — Encounter: Admit: 2021-04-08 | Discharge: 2021-04-08 | Payer: MEDICARE

## 2021-04-08 MED ORDER — CIPROFLOXACIN HCL 250 MG PO TAB
250 mg | ORAL_TABLET | Freq: Two times a day (BID) | ORAL | 0 refills | 10.00000 days | Status: AC
Start: 2021-04-08 — End: ?

## 2021-04-08 NOTE — Telephone Encounter
Reviewed labs from 04/03/2021 at this time    Hgb 11.1  WBC 7.1  Creatinine 1.91  Tacrolimus 4.4 (Goal 4-8)  UPC 0.4  Urine culture shows >100,000 cfu/ml E. Coli  CMV neg  BK neg    Patient reports urinary frequency and urgency    Received the following orders from Dr. Lenice Llamas :  Cipro 250 mg twice daily x 7 days (sent to [facility] in [place])    Reviewed labs with patient at this time via mychart. Advised of new mediation for UTI and no dose change.

## 2021-04-12 ENCOUNTER — Encounter: Admit: 2021-04-12 | Discharge: 2021-04-12 | Payer: MEDICARE

## 2021-04-13 MED FILL — MYCOPHENOLATE SODIUM 180 MG PO TBEC: 180 mg | ORAL | 30 days supply | Qty: 180 | Fill #2 | Status: AC

## 2021-04-17 ENCOUNTER — Ambulatory Visit: Admit: 2021-04-17 | Discharge: 2021-04-17 | Payer: MEDICARE

## 2021-04-17 ENCOUNTER — Encounter: Admit: 2021-04-17 | Discharge: 2021-04-17 | Payer: MEDICARE

## 2021-04-17 DIAGNOSIS — D849 Immunodeficiency, unspecified: Secondary | ICD-10-CM

## 2021-04-17 DIAGNOSIS — Z940 Kidney transplant status: Secondary | ICD-10-CM

## 2021-04-17 DIAGNOSIS — Z79899 Other long term (current) drug therapy: Secondary | ICD-10-CM

## 2021-04-17 LAB — CBC AND DIFF
ABSOLUTE BASO COUNT: 0 K/UL (ref 0–0.20)
ABSOLUTE EOS COUNT: 0.1 K/UL (ref 0–0.45)
ABSOLUTE LYMPH COUNT: 0.5 K/UL — ABNORMAL LOW (ref 1.0–4.8)
ABSOLUTE MONO COUNT: 0.5 K/UL (ref 0–0.80)
ABSOLUTE NEUTROPHIL: 5.8 K/UL (ref 1.8–7.0)
BASOPHILS %: 1 % (ref 0–2)
EOSINOPHILS %: 2 % (ref 0–5)
HEMATOCRIT: 35 % — ABNORMAL LOW (ref 36–45)
HEMOGLOBIN: 11 g/dL — ABNORMAL LOW (ref 12.0–15.0)
LYMPHOCYTES %: 7 % — ABNORMAL LOW (ref >60)
MCH: 26 pg (ref 26–34)
MCHC: 32 g/dL (ref 32.0–36.0)
MCV: 84 FL (ref 80–100)
MONOCYTES %: 7 % (ref 4–12)
MPV: 8.9 FL (ref 7–11)
NEUTROPHILS %: 83 % — ABNORMAL HIGH (ref 41–77)
PLATELET COUNT: 293 K/UL (ref 150–400)
RBC COUNT: 4.2 M/UL — ABNORMAL HIGH (ref 4.0–5.0)
RDW: 14 % (ref 11–15)
WBC COUNT: 7 K/UL (ref 4.5–11.0)

## 2021-04-17 LAB — MAGNESIUM: MAGNESIUM: 1.6 mg/dL (ref 1.6–2.6)

## 2021-04-17 LAB — COMPREHENSIVE METABOLIC PANEL
CHLORIDE: 104 MMOL/L (ref 98–110)
GLUCOSE,PANEL: 173 mg/dL — ABNORMAL HIGH (ref 70–100)
POTASSIUM: 3.8 MMOL/L (ref 3.5–5.1)
SODIUM: 139 MMOL/L (ref 137–147)

## 2021-04-17 LAB — PHOSPHORUS: PHOSPHORUS: 3.1 mg/dL — ABNORMAL HIGH (ref <0.15)

## 2021-04-17 LAB — URINALYSIS MICROSCOPIC REFLEX TO CULTURE

## 2021-04-17 LAB — URINALYSIS DIPSTICK REFLEX TO CULTURE
LEUKOCYTES: NEGATIVE
URINE PH: 6 (ref 5.0–8.0)

## 2021-04-17 LAB — PROTEIN/CR RATIO,UR RAN
UR CREATININE, RAN: 53 mg/dL
UR TOTAL PROTEIN,RAN: 32 mg/dL

## 2021-04-17 LAB — URIC ACID: URIC ACID: 4.1 mg/dL (ref 2.0–7.0)

## 2021-04-19 ENCOUNTER — Encounter: Admit: 2021-04-19 | Discharge: 2021-04-19 | Payer: MEDICARE

## 2021-04-20 ENCOUNTER — Encounter: Admit: 2021-04-20 | Discharge: 2021-04-20 | Payer: MEDICARE

## 2021-04-22 ENCOUNTER — Ambulatory Visit: Admit: 2021-04-22 | Discharge: 2021-04-22 | Payer: MEDICARE

## 2021-04-22 ENCOUNTER — Encounter: Admit: 2021-04-22 | Discharge: 2021-04-22 | Payer: MEDICARE

## 2021-04-22 DIAGNOSIS — D849 Immunodeficiency, unspecified: Secondary | ICD-10-CM

## 2021-04-22 DIAGNOSIS — I10 Essential (primary) hypertension: Secondary | ICD-10-CM

## 2021-04-22 DIAGNOSIS — Z940 Kidney transplant status: Secondary | ICD-10-CM

## 2021-04-22 DIAGNOSIS — N390 Urinary tract infection, site not specified: Principal | ICD-10-CM

## 2021-04-22 DIAGNOSIS — D472 Monoclonal gammopathy: Secondary | ICD-10-CM

## 2021-04-22 DIAGNOSIS — D649 Anemia, unspecified: Secondary | ICD-10-CM

## 2021-04-22 DIAGNOSIS — I2510 Atherosclerotic heart disease of native coronary artery without angina pectoris: Secondary | ICD-10-CM

## 2021-04-22 DIAGNOSIS — N186 End stage renal disease: Secondary | ICD-10-CM

## 2021-04-22 DIAGNOSIS — E119 Type 2 diabetes mellitus without complications: Secondary | ICD-10-CM

## 2021-04-22 DIAGNOSIS — R897 Abnormal histological findings in specimens from other organs, systems and tissues: Secondary | ICD-10-CM

## 2021-04-22 DIAGNOSIS — R809 Proteinuria, unspecified: Secondary | ICD-10-CM

## 2021-04-22 DIAGNOSIS — N19 Unspecified kidney failure: Secondary | ICD-10-CM

## 2021-04-22 NOTE — Progress Notes
INFECTIOUS DISEASES CLINIC  NEW PATIENT VISIT    PATIENT:  Jacqueline Shields  DOB:  02-24-1966  MRN:  [id number]  Date: 04/22/2021     Reason for Visit   Recurrent UTI        History of Present Illness:     DEZRA SOTOLONGO is a 55 y.o. 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    Ureteral stent and PD catheter were removed on 08/24/20. Post transplant course c/b perinephric fluid collection 7.5 x 3.5cm in 09/2020. she presented for aspiration on 10/16/20, but fluid was nearly resolved at this point so not pursued. Renal biopsy on 10/21/20 without evidence of rejection. She is on 2 drug IS as avoiding pred with DM. She completed 6 mo of bactrim ppx in June 2022.    She reports no history of UTI or UTI symptoms pre transplant. She had hysterectomy about 20 years, ovaries were retained but hasn't had regular periods in year. She had UTI symptoms with 1st episode of UTI in July of dysuria and discomfort with urination. Culture grew panS E coli which was treated with keflex. She had residual symptoms after it was completed and repeat uclture still with  [facility], so given another course of keflex. She hasn't had symptoms since but then is having UA checked every 2 weeks with her labs. She denies vaginal dryness or dyspareunia. Doesn't feel like empties bladder completely, often has to go to bathroom right after urinating to go again. She follows with family doctor for gynecologic care. Today she denies dysuria, suprapubic pain, flank pain. She did not have diarrhea or other side effects from antibiotics. Said she did not have symptoms with most recent UTI.    Urine testing --  11/21/20 urine cx - E faecalis and urogenital flora  02/20/21 UA w/ 20-50 WBC, urine cx - >100k E coli panS - tx keflex  03/06/21 UA w/ packed WBC, urine cx - >100k E coli panS -tx keflex  04/03/21 UA w/ packed WBC, urine cx - >100k E coli panS - tx cipro    Recent labs include: ALC = 0.5, Cr 1.67, near baseline, BK neg 9/3. Referred to urology but not scheduled until January 2023!       Review of Systems:  A comprehensive 14-point ROS was negative.          Past Medical History:  Medical History:   Diagnosis Date   ? Abnormal biopsy of kidney    ? Anemia    ? 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 Diabetes mellitus (HCC)    ? ESRD (end stage renal disease) (HCC)    ? Hypertension    ? Kidney failure    ? MGUS (monoclonal gammopathy of unknown significance)    ? Proteinuria        Past Surgical History:   Surgical History:   Procedure Laterality Date   ? ANGIOGRAPHY CORONARY ARTERY WITH LEFT HEART CATHETERIZATION N/A 07/03/2020    Performed by Harley Alto, MD at [facility] CATH LAB   ? POSSIBLE PERCUTANEOUS CORONARY STENT PLACEMENT WITH ANGIOPLASTY N/A 07/03/2020    Performed by Harley Alto, MD at [facility] CATH LAB   ? 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 REMOVAL TUNNELED INTRAPERITONEAL CATHETER Bilateral 08/24/2020    Performed by Nicholos Johns, MD at [facility] OR   ? CYSTOURETHROSCOPY WITH REMOVAL FOREIGN BODY/ CALCULUS/ STENT FROM URETHRA/ BLADDER - SIMPLE Right 08/24/2020    Performed by Marylen Ponto, MD at [facility] OR   ? CATHETER IMPLANT/REVISION      PD cath   ? HX HYSTERECTOMY     ? HX LITHOTRIPSY         Social History:  Social History     Socioeconomic History   ? Marital status: Married     Spouse name: Fredrik Cove   ? Number of children: 2   Tobacco Use   ? Smoking status: Never Smoker   ? Smokeless tobacco: Never Used   Vaping Use   ? Vaping Use: Never used   Substance and Sexual Activity   ? Alcohol use: Not Currently   ? Drug use: Never        Family History:  Family History   Problem Relation Age of Onset   ? Diabetes Mother    ? Hypertension Mother    ? Diabetes Father    ? Hypertension Father    ? Cancer-Hematologic Father    ? Diabetes Sister    ? Migraines Sister    ? Cancer Sister         Endometrial        Immunizations:  Immunization History   Administered Date(s) Administered   ? COVID-19 (MODERNA), mRNA vacc, 100 mcg/0.5 mL (PF) 10/16/2019, 11/13/2019, 01/20/2021        Allergies:  No Known Allergies     Current Outpatient Medications:  ? aspirin EC (ASPIR-LOW) 81 mg tablet Take one tablet by mouth daily. Take with food.   ? blood sugar diagnostic (ONETOUCH VERIO TEST STRIPS) test strip Use one strip as directed before meals and at bedtime. ICD-10: Type II Diabetes with unspecified complications; with long term insulin use E11.8, Z79.4  Indications: type 2 diabetes mellitus   ? calcitrioL (ROCALTROL) 0.25 mcg capsule Take one capsule by mouth daily.   ? cholecalciferol (VITAMIN D-3) 50 mcg (2,000 unit) tablet Take one tablet by mouth daily.   ? ferrous sulfate (FEOSOL) 325 mg (65 mg iron) tablet Take one tablet by mouth at bedtime daily. Take on an empty stomach at least 1 hour before or 2 hours after food.   ? insulin aspart (U-100) (NOVOLOG FLEXPEN U-100 INSULIN) 100 unit/mL (3 mL) PEN Follow Novolog mealtime dose taper to match prednisone taper.  Once off of prednisone, continue 5 units with meals and add 0-14 units per correction scale.  Max 84 units/day (Patient taking differently: 20 Units. Follow Novolog mealtime dose taper to match prednisone taper.  Once off of prednisone, continue 5 units with meals and add 0-14 units per correction scale.  Max 84 units/day)   ? insulin glargine (LANTUS SOLOSTAR U-100 INSULIN) 100 unit/mL (3 mL) subcutaneous PEN Inject 65 Units under the skin twice daily. (Patient taking differently: Inject 75 Units under the skin twice daily.)   ? lancets 33 gauge (ONETOUCH DELICA 33 GUAGE) 33 gauge Use one each as directed four times daily. ICD-10: Type II Diabetes with unspecified complications; with long term insulin use E11.8, Z79.4   ? mycophenolate DR (MYFORTIC) 180 mg TbEC tablet Take three tablets by mouth twice daily.   ? pen needle, diabetic (BD ULTRA-FINE NANO PEN  NEEDLE) 32 gauge x 5/32 pen needle Use one each as directed as Needed. Use with insulin injections.   ? rosuvastatin (CRESTOR) 20 mg tablet Take one tablet by mouth daily.   ? tacrolimus (PROGRAF) 1 mg capsule Take one capsule by mouth daily AND two capsules at bedtime daily.       Patient's medications, allergies, past medical, surgical, social and family histories were reviewed.  These were updated as appropriate.        Physical Exam:  Vitals:    04/22/21 1008   BP: 133/73   Pulse: 66   Temp: 36.6 ?C (97.8 ?F)   PainSc: Zero   Weight: 107.1 kg (236 lb 1.6 oz)   Height: 172.7 cm (5' 8)     GENERAL: in no acute distress, sitting comfortably in chair  EYES: no conjunctival hemorrhages, no scleral icterus   HEENT: no oropharyngeal lesions, thrush, or ulcers  RESPIRATORY: normal work of breathing, clear to auscultation bilaterally  CARDIOVASCULAR: normal rate, regular rhythm, no murmur, no LE edema  GI: abdomen soft, nontender, nondistended, no hepatomegaly or splenomegaly  LYMPHATIC: no lymphadenopathy of the cervical and axillary regions  MUSCULOSKELETAL: no acute joint erythema, effusions  PSYCHIATRIC: normal mood and affect  NEUROLOGIC: CN II-XII grossly intact, no gross motor deficits  SKIN: Well healed RLQ incision and L mid abdominal incision from transplant and PD catheter removal, respectively. no rashes or lesions on exposed skin, no embolic phenomena       Lab/Radiology/Other Diagnostic Tests:  CBC  Lab Results   Component Value Date    WBC 7.0 04/17/2021    HGB 11.5 (L) 04/17/2021    PLTCT 293 04/17/2021    ANC 5.85 04/17/2021    ALC 0.50 (L) 04/17/2021    MCV 84.2 04/17/2021       CMP  Lab Results   Component Value Date    NA 139 04/17/2021    K 3.8 04/17/2021    CL 104 04/17/2021    CO2 24 04/17/2021    CR 1.67 (H) 04/17/2021    GFR 19 (L) 07/03/2020    GLU 173 (H) 04/17/2021    AST 20 04/17/2021    ALT 21 04/17/2021    ALKPHOS 98 04/17/2021    TOTBILI 0.4 04/17/2021       Inflammatory markers  No results found for: ESR, CRP    Microbiology:  11/21/20 urine cx - E faecalis and urogenital flora  02/20/21 UA w/ 20-50 WBC, urine cx - >100k E coli panS - tx keflex  03/06/21 UA w/ packed WBC, urine cx - >100k E coli panS -tx keflex  04/03/21 UA w/ packed WBC, urine cx - >100k E coli panS - tx cipro    Imaging:  09/2020 Renal u/s  Right lower quadrant renal graft: Measures 11.0 cm. ?Minimal transplant   hydronephrosis. Along the posterior aspect of the right kidney, there is   an irregular fluid collection measuring approximately 7.5 x 1.8 x 3.5 cm;   this is poorly visualized/evaluated, but does not demonstrate obvious   internal complexity. Intraparenchymal flow: Intrarenal resistive indices are normal, ranging from 0.62-0.71.  Urinary Bladder: Mildly distended with fluid and otherwise unremarkable.   IMPRESSION   1. Minimal transplant hydronephrosis. Otherwise, unremarkable sonographic   appearance of the right lower quadrant transplant kidney.   2. Small fluid collection posterior to the transplant kidney.     Assessment     Assessment / Plan :  MAKYNZEE TIGGES is a 55  y.o. 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    Patient reports symptoms only with positive cultures in July and August, not with most recent positive culture in September. She denies vaginal dryness and/or dyspareunia, but has been in menopause for at least 15 years and likely has some component of vaginal atrophy. I recommended she discuss with her family doctor about need for vaginal estrogen cream as this may help reduce UTI frequency. In addition, we discussed hydration and frequent/full emptying of bladder to encourage passage of bacteria in bladder. She will see urology in January and may need urodynamics testing. We discussed at length that she is likely colonized with E coli in the bladder with need for judicious use of UA/culture and antibiotics only when symptomatic. We also discussed risk of antibiotic resistance with increasing use of antibiotics. We discussed use of OTC supplement such as D-mannose to reduce future UTI occurrences.     1. Recurrent UTI with E coli  -etiology unclear, but likely colonized with E coli  -patient will see urology in January 2023  -patient will discuss vaginal estrogen cream with her family doctor who provides her gynecological care  -recommended hydrating with fluids and frequent emptying of bladder  -we discussed possible use of OTC supplements such as D-mannose or Elura cranberry extract if able to afford  -recommend judicious use of UA and culture only when symptoms are present  -recommend against antibiotics for asymptomatic bacteriuria as this will lead to antibiotic resistance without decreasing future UTI episodes  -patient can see ID on as needed basis           Cyd Silence, MD  Transplant & Immunocompromised Infectious Diseases   04/22/21   Pager [phone]    Total Time Today was 60 minutes in the following activities: Preparing to see the patient, Obtaining and/or reviewing separately obtained history, Performing a medically appropriate examination and/or evaluation, Counseling and educating the patient/family/caregiver, Referring and communication with other health care professionals (when not separately reported), Documenting clinical information in the electronic or other health record and Independently interpreting results (not separately reported) and communicating results to the patient/family/caregiver

## 2021-04-30 ENCOUNTER — Encounter: Admit: 2021-04-30 | Discharge: 2021-04-30 | Payer: MEDICARE

## 2021-05-01 ENCOUNTER — Encounter: Admit: 2021-05-01 | Discharge: 2021-05-01 | Payer: MEDICARE

## 2021-05-01 ENCOUNTER — Ambulatory Visit: Admit: 2021-05-01 | Discharge: 2021-05-01 | Payer: MEDICARE

## 2021-05-01 DIAGNOSIS — D849 Immunodeficiency, unspecified: Secondary | ICD-10-CM

## 2021-05-01 DIAGNOSIS — Z79899 Other long term (current) drug therapy: Secondary | ICD-10-CM

## 2021-05-01 DIAGNOSIS — Z940 Kidney transplant status: Secondary | ICD-10-CM

## 2021-05-01 LAB — CBC AND DIFF
ABSOLUTE BASO COUNT: 0 K/UL (ref 0–0.20)
ABSOLUTE EOS COUNT: 0.2 K/UL (ref 0–0.45)
ABSOLUTE LYMPH COUNT: 0.4 K/UL — ABNORMAL LOW (ref 1.0–4.8)
ABSOLUTE MONO COUNT: 0.4 K/UL (ref 0–0.80)
ABSOLUTE NEUTROPHIL: 5.5 K/UL (ref 1.8–7.0)
BASOPHILS %: 1 % (ref 0–2)
EOSINOPHILS %: 3 % — ABNORMAL LOW (ref >60)
HEMATOCRIT: 36 % — ABNORMAL HIGH (ref 36–45)
HEMOGLOBIN: 11 g/dL — ABNORMAL LOW (ref 12.0–15.0)
LYMPHOCYTES %: 7 % — ABNORMAL LOW (ref 24–44)
MCH: 26 pg (ref 26–34)
MCHC: 31 g/dL — ABNORMAL LOW (ref 32.0–36.0)
MCV: 84 FL (ref 80–100)
MONOCYTES %: 7 % (ref 4–12)
MPV: 9.8 FL (ref 7–11)
NEUTROPHILS %: 82 % — ABNORMAL HIGH (ref 41–77)
PLATELET COUNT: 181 K/UL (ref 150–400)
RBC COUNT: 4.3 M/UL — ABNORMAL HIGH (ref 4.0–5.0)
RDW: 14 % (ref 11–15)
WBC COUNT: 6.8 K/UL (ref 4.5–11.0)

## 2021-05-01 LAB — MAGNESIUM: MAGNESIUM: 1.7 mg/dL (ref 1.6–2.6)

## 2021-05-01 LAB — URINALYSIS DIPSTICK REFLEX TO CULTURE
LEUKOCYTES: NEGATIVE
PROTEIN,UA: NEGATIVE
URINE PH: 6 (ref 5.0–8.0)

## 2021-05-01 LAB — PROTEIN/CR RATIO,UR RAN
UR CREATININE, RAN: 71 mg/dL
UR TOTAL PROTEIN,RAN: 42 mg/dL

## 2021-05-01 LAB — URIC ACID: URIC ACID: 4.7 mg/dL (ref 2.0–7.0)

## 2021-05-01 LAB — COMPREHENSIVE METABOLIC PANEL
POTASSIUM: 4.6 MMOL/L (ref 3.5–5.1)
SODIUM: 144 MMOL/L (ref 137–147)

## 2021-05-01 LAB — URINALYSIS MICROSCOPIC REFLEX TO CULTURE

## 2021-05-01 LAB — PHOSPHORUS: PHOSPHORUS: 3.9 mg/dL — ABNORMAL HIGH (ref <0.15)

## 2021-05-02 MED FILL — TACROLIMUS 1 MG PO CAP: 1 mg | ORAL | 30 days supply | Qty: 90 | Fill #1 | Status: AC

## 2021-05-09 IMAGING — CT CALCIUM_SCORING(Adult)
2 series · 14 of 20 positions shown, 16 images · non-contrast
Comparison: none

[Series 2: calcium scoring 3.00 qr36 bestdiast 66% · axial · 0.37mm/px · z∈[+1740,+1852]mm · 6 of 40 slices shown, 8 images]
[im 6/40  vessel]
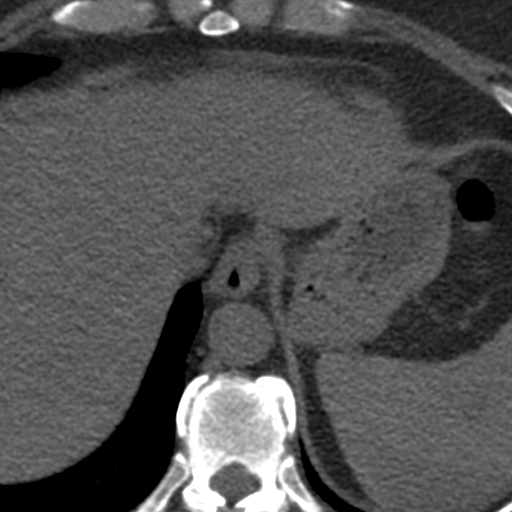
[im 6/40  lung]
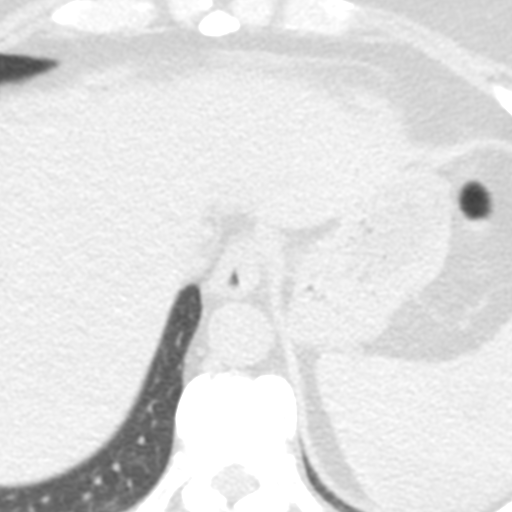
[im 12/40  vessel]
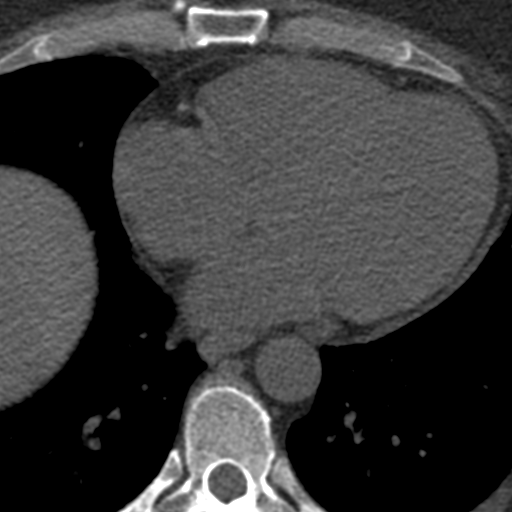
[im 17/40  vessel]
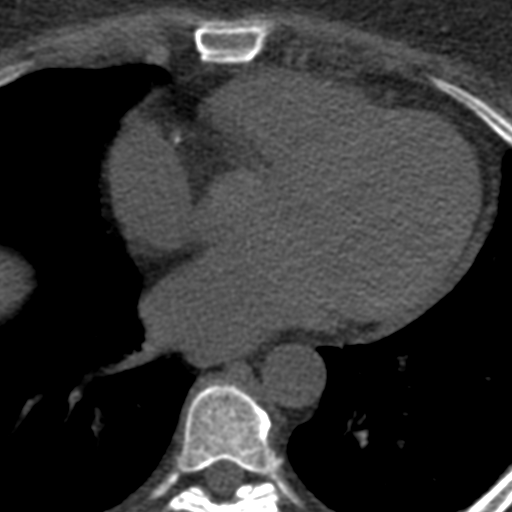
[im 23/40  vessel]
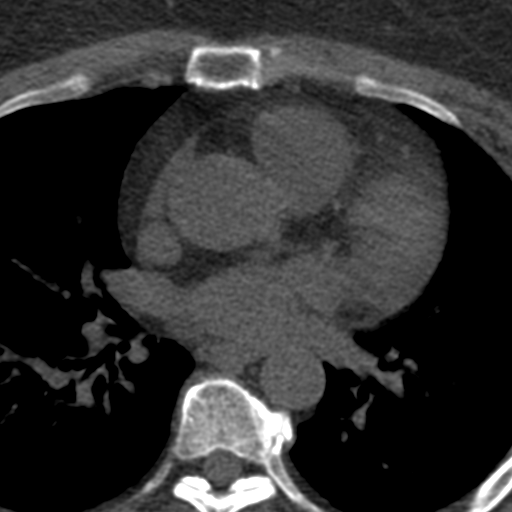
[im 28/40  vessel]
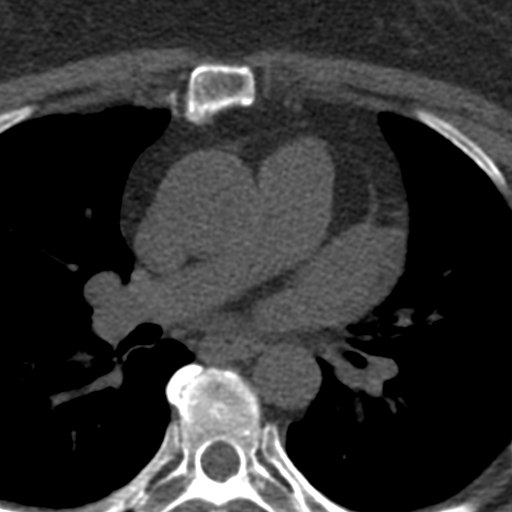
[im 28/40  lung]
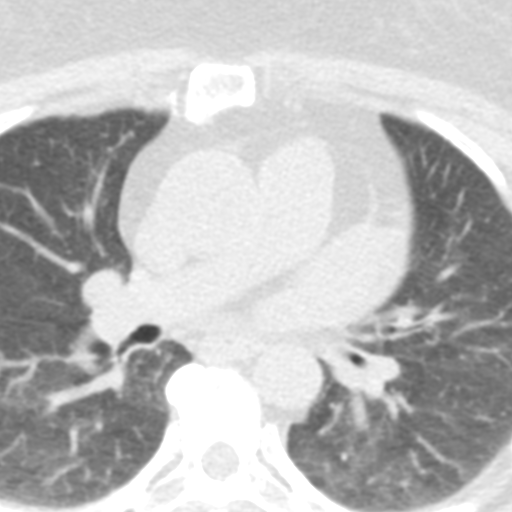
[im 34/40  vessel]
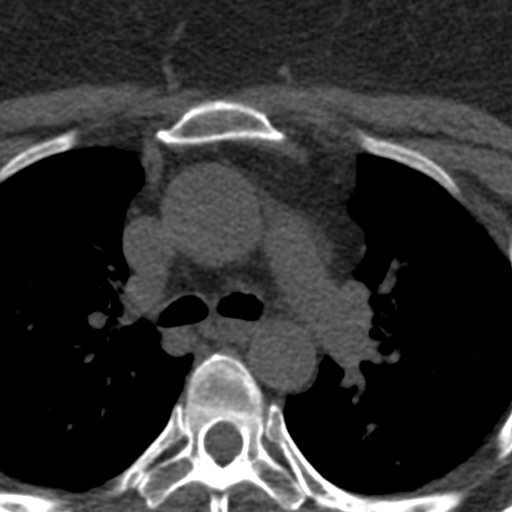

[Series 3: calcium scoring 1.50 br60 bestdiast 66% lung · axial · 0.37mm/px · z∈[+1734,+1859]mm · 8 of 63 slices shown]
[im 12/63  lung]
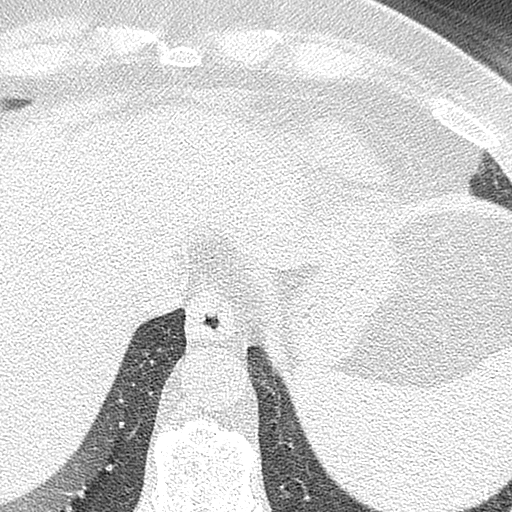
[im 17/63  lung]
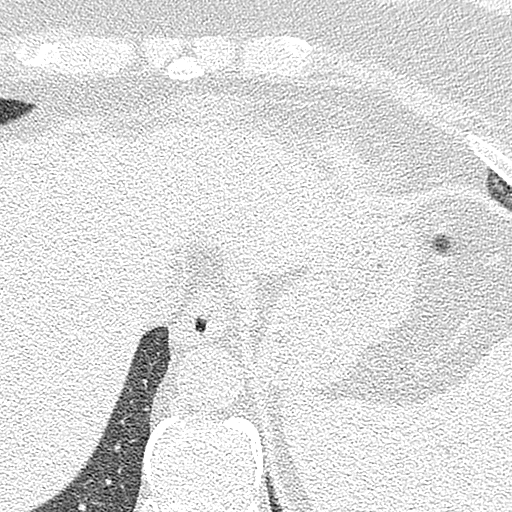
[im 23/63  lung]
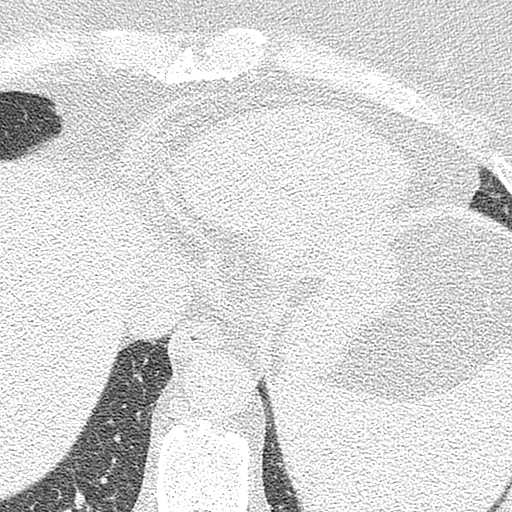
[im 29/63  lung]
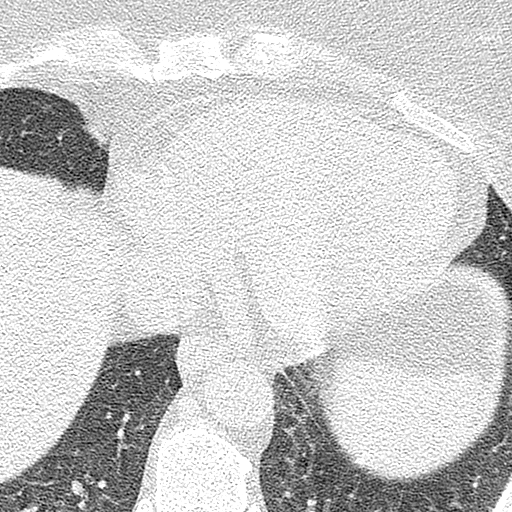
[im 40/63  lung]
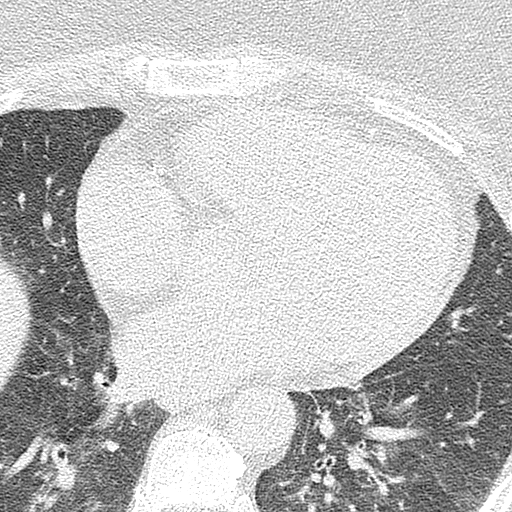
[im 46/63  lung]
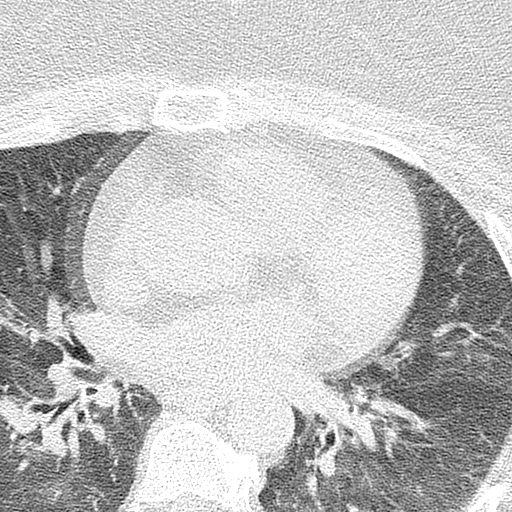
[im 51/63  lung]
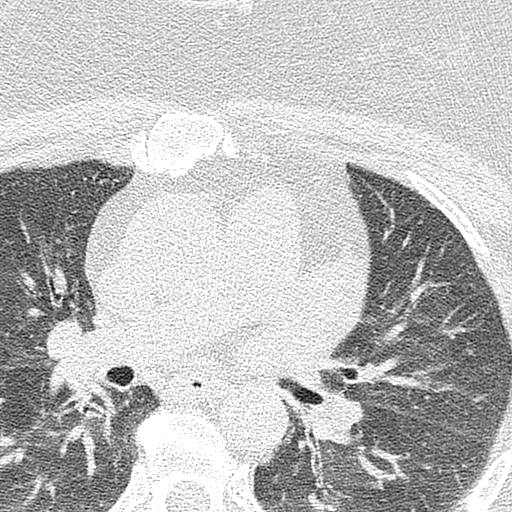
[im 57/63  lung]
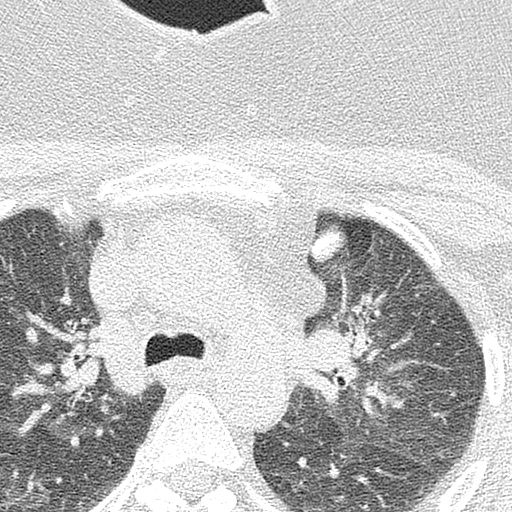

[14 of 20 positions shown; findings below may reference images not displayed]

Ordering:    HUNTER, ROSANNE

EXAM

CT cardiac calcium scoring

INDICATION

Pre transplant evaluation

TECHNIQUE

Gated calcification scoring has been obtained. All CT scans at this facility use dose modulation,
iterative reconstruction, and/or weight based dosing when appropriate to reduce radiation dose to as
low as reasonably achievable.

# of CT scans in the past year: 0 # of Myocardial perfusion scans this past year: 0

COMPARISONS

None available at the time of dictation.

FINDINGS

Total calcification score is calculated at

Left main artery RIGUENE 130 score: 0

Left anterior descending RIGUENE 130 score:

Left circumflex RIGUENE 130 score: 0

Right coronary artery RIGUENE 130 score:

These scores demonstrate mild plaque burden. Moderate cardiovascular disease risk.

The heart is normal size.

EXTRACARDIAC FINDINGS: Low lung volumes. Diffusely distributed areas of ground-glass attenuation
which may be exaggerated secondary to low lung volumes in expiratory phase of imaging.

IMPRESSION

1. Coronary calcification score calculated at 85.5.

Calcium score

0-0: No identifiable atherosclerotic plaque. Very low cardiovascular disease risk.

1-10: Minimal plaque burden. Low cardiovascular disease risk.

11-100: Mild plaque burden. Moderate cardiovascular disease risk.

101-400: Moderate plaque burden. High cardiovascular disease risk.

Greater than 401: Extensive plaque burden. Very High cardiovascular disease risk.

Tech Notes:

PT STATES HAS HTN. HX OF RENAL FAILURE. CT/NM 0/0. TJ

## 2021-05-11 ENCOUNTER — Encounter: Admit: 2021-05-11 | Discharge: 2021-05-11 | Payer: MEDICARE

## 2021-05-11 MED FILL — MYCOPHENOLATE SODIUM 180 MG PO TBEC: 180 mg | ORAL | 30 days supply | Qty: 180 | Fill #3 | Status: AC

## 2021-05-15 ENCOUNTER — Ambulatory Visit: Admit: 2021-05-15 | Discharge: 2021-05-15 | Payer: MEDICARE

## 2021-05-15 ENCOUNTER — Encounter: Admit: 2021-05-15 | Discharge: 2021-05-15 | Payer: MEDICARE

## 2021-05-15 DIAGNOSIS — Z79899 Other long term (current) drug therapy: Secondary | ICD-10-CM

## 2021-05-15 DIAGNOSIS — D849 Immunodeficiency, unspecified: Secondary | ICD-10-CM

## 2021-05-15 DIAGNOSIS — Z940 Kidney transplant status: Secondary | ICD-10-CM

## 2021-05-15 LAB — PHOSPHORUS: PHOSPHORUS: 3.7 mg/dL (ref 2.0–4.5)

## 2021-05-15 LAB — CBC AND DIFF
ABSOLUTE BASO COUNT: 0 K/UL (ref 0–0.20)
ABSOLUTE EOS COUNT: 0.1 K/UL (ref 0–0.45)
ABSOLUTE MONO COUNT: 0.6 K/UL (ref 0–0.80)
RBC COUNT: 4.1 M/UL — ABNORMAL HIGH (ref 4.0–5.0)
WBC COUNT: 3.6 K/UL — ABNORMAL LOW (ref 4.5–11.0)

## 2021-05-15 LAB — URINALYSIS DIPSTICK REFLEX TO CULTURE
LEUKOCYTES: NEGATIVE K/UL (ref 1.8–7.0)
NITRITE: NEGATIVE % (ref 0–2)
URINE ASCORBIC ACID, UA: NEGATIVE K/UL — ABNORMAL LOW (ref 1.0–4.8)
URINE BILE: NEGATIVE % — ABNORMAL LOW (ref 24–44)
URINE BLOOD: NEGATIVE % — ABNORMAL HIGH (ref 4–12)
URINE KETONE: NEGATIVE % (ref 41–77)

## 2021-05-15 LAB — URIC ACID: URIC ACID: 5 mg/dL (ref 2.0–7.0)

## 2021-05-15 LAB — COMPREHENSIVE METABOLIC PANEL
POTASSIUM: 4.4 MMOL/L (ref 3.5–5.1)
SODIUM: 142 MMOL/L (ref 137–147)

## 2021-05-15 LAB — URINALYSIS MICROSCOPIC REFLEX TO CULTURE

## 2021-05-15 LAB — PROTEIN/CR RATIO,UR RAN
PROT CREAT RAT/CAL: 0.5 K/UL — ABNORMAL HIGH (ref <0.15)
UR CREATININE, RAN: 94 mg/dL (ref 5.0–8.0)
UR TOTAL PROTEIN,RAN: 45 mg/dL (ref 1.005–1.030)

## 2021-05-15 LAB — MAGNESIUM: MAGNESIUM: 1.6 mg/dL (ref 1.6–2.6)

## 2021-05-17 ENCOUNTER — Encounter: Admit: 2021-05-17 | Discharge: 2021-05-17 | Payer: MEDICARE

## 2021-05-17 DIAGNOSIS — Z940 Kidney transplant status: Secondary | ICD-10-CM

## 2021-05-17 MED ORDER — TACROLIMUS 1 MG PO CAP
2 mg | ORAL_CAPSULE | Freq: Every day | ORAL | 3 refills | Status: CN
Start: 2021-05-17 — End: ?

## 2021-05-17 NOTE — Telephone Encounter
Reviewed labs from 05/15/2021 at this time    Hgb 11.1  WBC 3.6  Creatinine  1.95  Mg 1.6  Phos 3.7  Tacrolimus 3.6  (Goal 4-8)  UPC 0.5    Received the following orders from Dr. Lyndel Safe :  Increase tacrolimus to 2 mg twice daily     Reviewed labs with patient at this time via mychart  Gave dose change to patient at this time and asked for message back to confirm.   Medication list updated

## 2021-05-18 ENCOUNTER — Encounter: Admit: 2021-05-18 | Discharge: 2021-05-18 | Payer: MEDICARE

## 2021-05-18 ENCOUNTER — Ambulatory Visit: Admit: 2021-05-18 | Discharge: 2021-05-19 | Payer: MEDICARE

## 2021-05-18 DIAGNOSIS — Z940 Kidney transplant status: Secondary | ICD-10-CM

## 2021-05-18 DIAGNOSIS — E1369 Other specified diabetes mellitus with other specified complication: Secondary | ICD-10-CM

## 2021-05-18 MED ORDER — MYCOPHENOLATE SODIUM 180 MG PO TBEC
360 mg | ORAL_TABLET | Freq: Two times a day (BID) | ORAL | 3 refills | Status: CN
Start: 2021-05-18 — End: ?

## 2021-05-25 ENCOUNTER — Encounter: Admit: 2021-05-25 | Discharge: 2021-05-25 | Payer: MEDICARE

## 2021-05-25 DIAGNOSIS — Z940 Kidney transplant status: Secondary | ICD-10-CM

## 2021-05-25 MED ORDER — TACROLIMUS 1 MG PO CAP
2 mg | ORAL_CAPSULE | Freq: Two times a day (BID) | ORAL | 3 refills | Status: CN
Start: 2021-05-25 — End: ?

## 2021-05-29 ENCOUNTER — Encounter: Admit: 2021-05-29 | Discharge: 2021-05-29 | Payer: MEDICARE

## 2021-05-29 ENCOUNTER — Ambulatory Visit: Admit: 2021-05-29 | Discharge: 2021-05-29 | Payer: MEDICARE

## 2021-05-29 DIAGNOSIS — Z940 Kidney transplant status: Secondary | ICD-10-CM

## 2021-05-29 DIAGNOSIS — D849 Immunodeficiency, unspecified: Secondary | ICD-10-CM

## 2021-05-29 DIAGNOSIS — Z79899 Other long term (current) drug therapy: Secondary | ICD-10-CM

## 2021-05-29 DIAGNOSIS — E1369 Other specified diabetes mellitus with other specified complication: Secondary | ICD-10-CM

## 2021-05-29 LAB — CBC AND DIFF
ABSOLUTE BASO COUNT: 0 K/UL (ref 0–0.20)
ABSOLUTE EOS COUNT: 0.1 K/UL (ref 0–0.45)
ABSOLUTE LYMPH COUNT: 0.5 K/UL — ABNORMAL LOW (ref 1.0–4.8)
ABSOLUTE MONO COUNT: 0.6 K/UL (ref 0–0.80)
ABSOLUTE NEUTROPHIL: 4.4 K/UL (ref 1.8–7.0)
BASOPHILS %: 1 % (ref 0–2)
EOSINOPHILS %: 3 % — ABNORMAL LOW (ref >60)
HEMATOCRIT: 35 % — ABNORMAL LOW (ref 36–45)
HEMOGLOBIN: 11 g/dL — ABNORMAL LOW (ref 12.0–15.0)
LYMPHOCYTES %: 10 % — ABNORMAL LOW (ref 24–44)
MCH: 27 pg (ref 26–34)
MCHC: 33 g/dL (ref 32.0–36.0)
MCV: 82 FL (ref 80–100)
MONOCYTES %: 11 % (ref 4–12)
MPV: 9.1 FL (ref 7–11)
NEUTROPHILS %: 75 % (ref 41–77)
PLATELET COUNT: 176 K/UL (ref 150–400)
RBC COUNT: 4.2 M/UL — ABNORMAL HIGH (ref 4.0–5.0)
RDW: 14 % (ref 11–15)
WBC COUNT: 5.8 K/UL (ref 4.5–11.0)

## 2021-05-29 LAB — MAGNESIUM: MAGNESIUM: 1.6 mg/dL (ref 1.6–2.6)

## 2021-05-29 LAB — PHOSPHORUS: PHOSPHORUS: 3.7 mg/dL — ABNORMAL HIGH (ref <0.15)

## 2021-05-29 LAB — URINALYSIS DIPSTICK REFLEX TO CULTURE
LEUKOCYTES: NEGATIVE
URINE PH: 6 (ref 5.0–8.0)

## 2021-05-29 LAB — PROTEIN/CR RATIO,UR RAN
UR CREATININE, RAN: 58 mg/dL
UR TOTAL PROTEIN,RAN: 32 mg/dL

## 2021-05-29 LAB — URIC ACID: URIC ACID: 4.3 mg/dL (ref 2.0–7.0)

## 2021-05-29 LAB — URINALYSIS MICROSCOPIC REFLEX TO CULTURE

## 2021-05-29 LAB — HEMOGLOBIN A1C: HEMOGLOBIN A1C: 10 % — ABNORMAL HIGH (ref 4.0–6.0)

## 2021-05-29 LAB — COMPREHENSIVE METABOLIC PANEL
POTASSIUM: 4.7 MMOL/L (ref 3.5–5.1)
SODIUM: 139 MMOL/L (ref 137–147)

## 2021-05-31 ENCOUNTER — Encounter: Admit: 2021-05-31 | Discharge: 2021-05-31 | Payer: MEDICARE

## 2021-05-31 MED FILL — TACROLIMUS 1 MG PO CAP: 1 mg | ORAL | 30 days supply | Qty: 120 | Fill #1 | Status: AC

## 2021-06-01 ENCOUNTER — Encounter: Admit: 2021-06-01 | Discharge: 2021-06-01 | Payer: MEDICARE

## 2021-06-12 ENCOUNTER — Ambulatory Visit: Admit: 2021-06-12 | Discharge: 2021-06-12 | Payer: MEDICARE

## 2021-06-12 ENCOUNTER — Encounter: Admit: 2021-06-12 | Discharge: 2021-06-12 | Payer: MEDICARE

## 2021-06-12 DIAGNOSIS — Z79899 Other long term (current) drug therapy: Secondary | ICD-10-CM

## 2021-06-12 DIAGNOSIS — Z940 Kidney transplant status: Secondary | ICD-10-CM

## 2021-06-12 DIAGNOSIS — D849 Immunodeficiency, unspecified: Secondary | ICD-10-CM

## 2021-06-12 LAB — PHOSPHORUS: PHOSPHORUS: 4 mg/dL (ref 2.0–4.5)

## 2021-06-12 LAB — URINALYSIS MICROSCOPIC REFLEX TO CULTURE

## 2021-06-12 LAB — URINALYSIS DIPSTICK REFLEX TO CULTURE
LEUKOCYTES: NEGATIVE
NITRITE: NEGATIVE
URINE ASCORBIC ACID, UA: NEGATIVE
URINE BILE: NEGATIVE
URINE BLOOD: NEGATIVE
URINE KETONE: NEGATIVE

## 2021-06-12 LAB — CBC AND DIFF
ABSOLUTE BASO COUNT: 0 K/UL (ref 0–0.20)
ABSOLUTE EOS COUNT: 0.2 K/UL (ref 0–0.45)
RBC COUNT: 4.5 M/UL (ref 4.0–5.0)
WBC COUNT: 6.4 K/UL (ref 4.5–11.0)

## 2021-06-12 LAB — PROTEIN/CR RATIO,UR RAN
UR CREATININE, RAN: 54 mg/dL
UR TOTAL PROTEIN,RAN: 28 mg/dL

## 2021-06-12 LAB — COMPREHENSIVE METABOLIC PANEL
ALBUMIN: 4.1 g/dL — ABNORMAL LOW (ref 3.5–5.0)
ALK PHOSPHATASE: 82 U/L (ref 25–110)
ALT: 35 U/L (ref 7–56)
ANION GAP: 10 K/UL — ABNORMAL LOW (ref 3–12)
AST: 29 U/L (ref 7–40)
CALCIUM: 9.1 mg/dL (ref 8.5–10.6)
CO2: 25 MMOL/L (ref 21–30)
CREATININE: 1.7 mg/dL — ABNORMAL HIGH (ref 0.4–1.00)
EGFR: 34 mL/min — ABNORMAL LOW (ref >60)
GLUCOSE,PANEL: 124 mg/dL — ABNORMAL HIGH (ref 70–100)
POTASSIUM: 4.4 MMOL/L (ref 3.5–5.1)
SODIUM: 141 MMOL/L (ref 137–147)
TOTAL PROTEIN: 6.7 g/dL (ref 6.0–8.0)

## 2021-06-12 LAB — MAGNESIUM: MAGNESIUM: 1.5 mg/dL — ABNORMAL LOW (ref 1.6–2.6)

## 2021-06-12 LAB — URIC ACID: URIC ACID: 4.6 mg/dL (ref 2.0–7.0)

## 2021-06-14 ENCOUNTER — Encounter: Admit: 2021-06-14 | Discharge: 2021-06-14 | Payer: MEDICARE

## 2021-06-14 NOTE — Telephone Encounter
Reviewed labs from 06/12/2021 at this time    Hgb 12.2  WBC 6.4  Creatinine 1.72  Mg 1.5  Phos 4.0  Tacrolimus 9.3 (Goal 4-8)  UPC 0.5    Received the following orders from Dr. Lyndel Safe :  No dose change  Monitor next labs     Reviewed labs with patient at this time via mychart

## 2021-06-26 ENCOUNTER — Ambulatory Visit: Admit: 2021-06-26 | Discharge: 2021-06-26 | Payer: MEDICARE

## 2021-06-26 ENCOUNTER — Encounter: Admit: 2021-06-26 | Discharge: 2021-06-26 | Payer: MEDICARE

## 2021-06-26 DIAGNOSIS — Z940 Kidney transplant status: Secondary | ICD-10-CM

## 2021-06-26 DIAGNOSIS — D849 Immunodeficiency, unspecified: Secondary | ICD-10-CM

## 2021-06-26 DIAGNOSIS — Z79899 Other long term (current) drug therapy: Secondary | ICD-10-CM

## 2021-06-26 LAB — URIC ACID: URIC ACID: 4.4 mg/dL (ref 2.0–7.0)

## 2021-06-26 LAB — CBC AND DIFF
ABSOLUTE MONO COUNT: 0.6 K/UL — ABNORMAL LOW (ref 0–0.80)
HEMATOCRIT: 39 % — ABNORMAL HIGH (ref 36–45)
HEMOGLOBIN: 12 g/dL — ABNORMAL HIGH (ref 12.0–15.0)
MCH: 27 pg (ref 26–34)
MCHC: 32 g/dL (ref 32.0–36.0)
MCV: 82 FL (ref 80–100)
MPV: 9 FL — ABNORMAL LOW (ref 7–11)
RBC COUNT: 4.7 M/UL — ABNORMAL HIGH (ref 4.0–5.0)
WBC COUNT: 8 K/UL (ref 4.5–11.0)

## 2021-06-26 LAB — COMPREHENSIVE METABOLIC PANEL
ALK PHOSPHATASE: 79 U/L — ABNORMAL LOW (ref 25–110)
CO2: 24 MMOL/L — ABNORMAL HIGH (ref 21–30)
POTASSIUM: 4.5 MMOL/L (ref 3.5–5.1)

## 2021-06-26 LAB — MAGNESIUM: MAGNESIUM: 1.6 mg/dL (ref 1.6–2.6)

## 2021-06-26 LAB — URINALYSIS MICROSCOPIC REFLEX TO CULTURE

## 2021-06-26 LAB — URINALYSIS DIPSTICK REFLEX TO CULTURE
LEUKOCYTES: NEGATIVE
PROTEIN,UA: NEGATIVE
URINE ASCORBIC ACID, UA: NEGATIVE MMOL/L (ref 137–147)
URINE PH: 6 (ref 5.0–8.0)

## 2021-06-26 LAB — PROTEIN/CR RATIO,UR RAN
PROT CREAT RAT/CAL: 0.5 mg/dL — ABNORMAL HIGH (ref <0.15)
UR CREATININE, RAN: 71 mg/dL
UR TOTAL PROTEIN,RAN: 39 mg/dL

## 2021-06-29 ENCOUNTER — Encounter: Admit: 2021-06-29 | Discharge: 2021-06-29 | Payer: MEDICARE

## 2021-06-29 MED FILL — TACROLIMUS 1 MG PO CAP: 1 mg | ORAL | 30 days supply | Qty: 120 | Fill #2 | Status: AC

## 2021-06-29 MED FILL — MYCOPHENOLATE SODIUM 180 MG PO TBEC: 180 mg | ORAL | 30 days supply | Qty: 120 | Fill #1 | Status: AC

## 2021-07-05 ENCOUNTER — Encounter: Admit: 2021-07-05 | Discharge: 2021-07-05 | Payer: MEDICARE

## 2021-07-05 NOTE — Telephone Encounter
Reviewed labs from 06/26/2021 at this time    Hgb 12.9  WBC 8.0  Creatinine 1.9  Mg 1.6  Phos 3.4  Tacrolimus 7.9 (Goal 4-8)  UPC 0.5  CMV not detected  BK not detected     Reviewed labs with patient at this time via mychart. Advised no dose change.

## 2021-07-10 ENCOUNTER — Ambulatory Visit: Admit: 2021-07-10 | Discharge: 2021-07-10 | Payer: MEDICARE

## 2021-07-10 ENCOUNTER — Encounter: Admit: 2021-07-10 | Discharge: 2021-07-10 | Payer: MEDICARE

## 2021-07-10 DIAGNOSIS — D849 Immunodeficiency, unspecified: Secondary | ICD-10-CM

## 2021-07-10 DIAGNOSIS — Z79899 Other long term (current) drug therapy: Secondary | ICD-10-CM

## 2021-07-10 DIAGNOSIS — Z940 Kidney transplant status: Secondary | ICD-10-CM

## 2021-07-10 DIAGNOSIS — E1369 Other specified diabetes mellitus with other specified complication: Secondary | ICD-10-CM

## 2021-07-10 LAB — CBC AND DIFF
ABSOLUTE BASO COUNT: 0 K/UL (ref 0–0.20)
ABSOLUTE EOS COUNT: 0.2 K/UL (ref 0–0.45)
ABSOLUTE LYMPH COUNT: 0.8 K/UL — ABNORMAL LOW (ref 1.0–4.8)
ABSOLUTE MONO COUNT: 0.6 K/UL (ref 0–0.80)
ABSOLUTE NEUTROPHIL: 4.9 K/UL (ref 1.8–7.0)
BASOPHILS %: 1 % (ref 0–2)
EOSINOPHILS %: 4 % — ABNORMAL LOW (ref >60)
HEMATOCRIT: 37 % — ABNORMAL HIGH (ref 36–45)
MCH: 27 pg (ref 26–34)
MCHC: 32 g/dL (ref 32.0–36.0)
MCV: 83 FL (ref 80–100)
NEUTROPHILS %: 73 % (ref 41–77)
WBC COUNT: 6.7 K/UL (ref 4.5–11.0)

## 2021-07-10 LAB — COMPREHENSIVE METABOLIC PANEL
ALBUMIN: 3.9 g/dL (ref 3.5–5.0)
ALK PHOSPHATASE: 84 U/L (ref 25–110)
ALT: 25 U/L — ABNORMAL LOW (ref 7–56)
ANION GAP: 10 % (ref 3–12)
AST: 20 U/L (ref 7–40)
BLD UREA NITROGEN: 29 mg/dL — ABNORMAL HIGH (ref 7–25)
GLUCOSE,PANEL: 92 mg/dL (ref 70–100)
POTASSIUM: 4.2 MMOL/L (ref 3.5–5.1)
SODIUM: 140 MMOL/L (ref 137–147)

## 2021-07-10 LAB — URINALYSIS DIPSTICK REFLEX TO CULTURE
LEUKOCYTES: NEGATIVE
NITRITE: NEGATIVE mg/dL — ABNORMAL LOW (ref 1.6–2.6)
PROTEIN,UA: NEGATIVE
URINE BILE: NEGATIVE mg/dL
URINE PH: 7 (ref 5.0–8.0)

## 2021-07-10 LAB — URINALYSIS MICROSCOPIC REFLEX TO CULTURE

## 2021-07-10 LAB — PROTEIN/CR RATIO,UR RAN: UR TOTAL PROTEIN,RAN: 27 mg/dL

## 2021-07-10 LAB — PHOSPHORUS: PHOSPHORUS: 3.2 mg/dL — ABNORMAL HIGH (ref <0.15)

## 2021-07-10 LAB — URIC ACID: URIC ACID: 4.9 mg/dL (ref 2.0–7.0)

## 2021-07-13 ENCOUNTER — Encounter: Admit: 2021-07-13 | Discharge: 2021-07-13 | Payer: MEDICARE

## 2021-07-13 DIAGNOSIS — Z20822 Encounter for screening laboratory testing for COVID-19 virus in asymptomatic patient: Secondary | ICD-10-CM

## 2021-07-13 DIAGNOSIS — Z940 Kidney transplant status: Secondary | ICD-10-CM

## 2021-07-13 DIAGNOSIS — D849 Immunodeficiency, unspecified: Secondary | ICD-10-CM

## 2021-07-13 DIAGNOSIS — Z79899 Other long term (current) drug therapy: Secondary | ICD-10-CM

## 2021-07-13 NOTE — Telephone Encounter
Reviewed labs from 07/10/2021 at this time    Hgb 12.5  WBC 6.7  Creatinine 1.78  Mg 1.5  Phos 3.2  Tacrolimus 7.3 (Goal 4-8)  UPC 0.5    Reviewed labs with patient at this time via mychart. Advised no dose change.

## 2021-07-21 ENCOUNTER — Encounter: Admit: 2021-07-21 | Discharge: 2021-07-21 | Payer: MEDICARE

## 2021-07-21 MED ORDER — CHOLECALCIFEROL (VITAMIN D3) 50 MCG (2,000 UNIT) PO CAP
ORAL_CAPSULE | Freq: Every day | ORAL | 3 refills | 84.00000 days | Status: AC
Start: 2021-07-21 — End: ?

## 2021-07-23 ENCOUNTER — Encounter: Admit: 2021-07-23 | Discharge: 2021-07-23 | Payer: MEDICARE

## 2021-07-23 MED FILL — TACROLIMUS 1 MG PO CAP: 1 mg | ORAL | 30 days supply | Qty: 120 | Fill #3 | Status: AC

## 2021-07-23 MED FILL — MYCOPHENOLATE SODIUM 180 MG PO TBEC: 180 mg | ORAL | 30 days supply | Qty: 120 | Fill #2 | Status: AC

## 2021-07-31 ENCOUNTER — Encounter: Admit: 2021-07-31 | Discharge: 2021-07-31 | Payer: MEDICARE

## 2021-07-31 MED ORDER — PEN NEEDLE, DIABETIC 32 GAUGE X 5/32" MISC NDLE
1 | 3 refills | PRN
Start: 2021-07-31 — End: ?

## 2021-08-03 ENCOUNTER — Encounter: Admit: 2021-08-03 | Discharge: 2021-08-03 | Payer: MEDICARE

## 2021-08-03 ENCOUNTER — Ambulatory Visit: Admit: 2021-08-03 | Discharge: 2021-08-03 | Payer: MEDICARE

## 2021-08-03 DIAGNOSIS — I2510 Atherosclerotic heart disease of native coronary artery without angina pectoris: Secondary | ICD-10-CM

## 2021-08-03 DIAGNOSIS — N19 Unspecified kidney failure: Secondary | ICD-10-CM

## 2021-08-03 DIAGNOSIS — I10 Essential (primary) hypertension: Secondary | ICD-10-CM

## 2021-08-03 DIAGNOSIS — D472 Monoclonal gammopathy: Secondary | ICD-10-CM

## 2021-08-03 DIAGNOSIS — D649 Anemia, unspecified: Secondary | ICD-10-CM

## 2021-08-03 DIAGNOSIS — Z940 Kidney transplant status: Secondary | ICD-10-CM

## 2021-08-03 DIAGNOSIS — R809 Proteinuria, unspecified: Secondary | ICD-10-CM

## 2021-08-03 DIAGNOSIS — R897 Abnormal histological findings in specimens from other organs, systems and tissues: Secondary | ICD-10-CM

## 2021-08-03 DIAGNOSIS — N186 End stage renal disease: Secondary | ICD-10-CM

## 2021-08-03 DIAGNOSIS — Z79899 Other long term (current) drug therapy: Secondary | ICD-10-CM

## 2021-08-03 DIAGNOSIS — D849 Immunodeficiency, unspecified: Secondary | ICD-10-CM

## 2021-08-03 DIAGNOSIS — E119 Type 2 diabetes mellitus without complications: Secondary | ICD-10-CM

## 2021-08-03 DIAGNOSIS — R21 Rash and other nonspecific skin eruption: Secondary | ICD-10-CM

## 2021-08-03 LAB — CBC AND DIFF
ABSOLUTE BASO COUNT: 0 K/UL (ref 0–0.20)
ABSOLUTE EOS COUNT: 0.2 K/UL (ref 0–0.45)
ABSOLUTE LYMPH COUNT: 0.7 K/UL — ABNORMAL LOW (ref 1.0–4.8)
ABSOLUTE MONO COUNT: 0.5 K/UL (ref 0–0.80)
ABSOLUTE NEUTROPHIL: 6.5 K/UL (ref 1.8–7.0)
BASOPHILS %: 1 % (ref 0–2)
EOSINOPHILS %: 3 % — ABNORMAL LOW (ref >60)
HEMATOCRIT: 35 % — ABNORMAL LOW (ref 36–45)
HEMOGLOBIN: 11 g/dL — ABNORMAL LOW (ref 12.0–15.0)
LYMPHOCYTES %: 9 % — ABNORMAL LOW (ref 24–44)
MCH: 27 pg (ref 26–34)
MCHC: 33 g/dL (ref 32.0–36.0)
MCV: 82 FL (ref 80–100)
MONOCYTES %: 7 % (ref 4–12)
MPV: 8.9 FL (ref 7–11)
NEUTROPHILS %: 80 % — ABNORMAL HIGH (ref 41–77)
PLATELET COUNT: 223 K/UL (ref 150–400)
RBC COUNT: 4.2 M/UL — ABNORMAL HIGH (ref 4.0–5.0)
RDW: 14 % (ref 11–15)
WBC COUNT: 8.1 K/UL (ref 4.5–11.0)

## 2021-08-03 LAB — URINALYSIS MICROSCOPIC REFLEX TO CULTURE

## 2021-08-03 LAB — PROTEIN/CR RATIO,UR RAN
UR CREATININE, RAN: 64 mg/dL — AB
UR TOTAL PROTEIN,RAN: 42 mg/dL — AB

## 2021-08-03 LAB — COMPREHENSIVE METABOLIC PANEL
POTASSIUM: 4.2 MMOL/L (ref 3.5–5.1)
SODIUM: 139 MMOL/L — AB (ref 137–147)

## 2021-08-03 LAB — PHOSPHORUS: PHOSPHORUS: 3.4 mg/dL — ABNORMAL HIGH (ref <0.15)

## 2021-08-03 LAB — URINALYSIS DIPSTICK REFLEX TO CULTURE: URINE PH: 5 (ref 5.0–8.0)

## 2021-08-03 LAB — URIC ACID: URIC ACID: 4.6 mg/dL — AB (ref 2.0–7.0)

## 2021-08-03 LAB — MAGNESIUM: MAGNESIUM: 1.6 mg/dL (ref 1.6–2.6)

## 2021-08-03 MED ORDER — INSULIN GLARGINE 100 UNIT/ML (3 ML) SC INJ PEN
75 [IU] | Freq: Two times a day (BID) | SUBCUTANEOUS | 0 refills | 68.00000 days | Status: AC
Start: 2021-08-03 — End: ?

## 2021-08-03 MED ORDER — ESTRADIOL 0.01 % (0.1 MG/GRAM) VA CREA
VAGINAL | 3 refills | 30.00000 days | Status: AC
Start: 2021-08-03 — End: ?

## 2021-08-03 NOTE — Patient Instructions
Fingertip Application Method  For Estrogen Cream    Wash your hands with soap and water and dry thoroughly.    Squeeze tube to express out 1 gram of cream (about enough to cover the tip of your index finger, from the last joint to the finger tip.  (see Figure 1)          Figure 1      Locate the vaginal opening (see Figure 2).  Immediately above the vaginal opening is the urethra (a small opening where urine is eliminated from you body).  The urethra may not be as easily identified as the vagina because the opening is much smaller, however, us the diagram to determine its approximate location.    Carefully spread the cream onto the top wall of the vagina just underneath the urethral area (see Figure 2, yellow highlighted area).  As the cream is spread, some may be gently inserted into the vagina: however, it is not necessary to push the cream high into the vagina. Rub this into the vaginal wall underneath the urethra as one would rub lotion into the skin.    Do NOT use the applicator that may come with the prescription for the estrogen cream. Use only the small finger tip amount as noted above.                       Figure 2

## 2021-08-04 ENCOUNTER — Encounter: Admit: 2021-08-04 | Discharge: 2021-08-04 | Payer: MEDICARE

## 2021-08-04 MED FILL — PEN NEEDLE, DIABETIC 32 GAUGE X 5/32" MISC NDLE: 32 gauge x 5/" | 60 days supply | Qty: 300 | Fill #1 | Status: AC

## 2021-08-17 ENCOUNTER — Encounter: Admit: 2021-08-17 | Discharge: 2021-08-17 | Payer: MEDICARE

## 2021-08-17 NOTE — Telephone Encounter
08/06/2021: Transplant [place]:  1. Normal resistive indices without significant elevated velocities  2. No hydronephrosis or renal sinus cyst or perniephric fluid collections  3. If continued clinic concern, consider ultrasound-guided biopsy

## 2021-08-27 ENCOUNTER — Encounter: Admit: 2021-08-27 | Discharge: 2021-08-27 | Payer: MEDICARE

## 2021-08-27 MED FILL — MYCOPHENOLATE SODIUM 180 MG PO TBEC: 180 mg | ORAL | 30 days supply | Qty: 120 | Fill #3 | Status: AC

## 2021-08-27 MED FILL — TACROLIMUS 1 MG PO CAP: 1 mg | ORAL | 30 days supply | Qty: 120 | Fill #4 | Status: AC

## 2021-09-04 ENCOUNTER — Encounter: Admit: 2021-09-04 | Discharge: 2021-09-04 | Payer: MEDICARE

## 2021-09-04 ENCOUNTER — Ambulatory Visit: Admit: 2021-09-04 | Discharge: 2021-09-04 | Payer: MEDICARE

## 2021-09-04 DIAGNOSIS — D849 Immunodeficiency, unspecified: Secondary | ICD-10-CM

## 2021-09-04 DIAGNOSIS — Z79899 Other long term (current) drug therapy: Secondary | ICD-10-CM

## 2021-09-04 DIAGNOSIS — Z940 Kidney transplant status: Secondary | ICD-10-CM

## 2021-09-04 LAB — CBC AND DIFF
ABSOLUTE BASO COUNT: 0 K/UL (ref 0–0.20)
ABSOLUTE EOS COUNT: 0.1 K/UL (ref 0–0.45)
ABSOLUTE LYMPH COUNT: 0.7 K/UL — ABNORMAL LOW (ref 1.0–4.8)
ABSOLUTE MONO COUNT: 0.5 K/UL (ref 0–0.80)
ABSOLUTE NEUTROPHIL: 6.9 K/UL (ref 1.8–7.0)
BASOPHILS %: 1 % (ref 0–2)
EOSINOPHILS %: 2 % — ABNORMAL LOW (ref >60)
HEMATOCRIT: 35 % — ABNORMAL LOW (ref 36–45)
HEMOGLOBIN: 11 g/dL — ABNORMAL LOW (ref 12.0–15.0)
LYMPHOCYTES %: 8 % — ABNORMAL LOW (ref 24–44)
MCH: 27 pg (ref 26–34)
MCHC: 33 g/dL (ref 32.0–36.0)
MCV: 82 FL (ref 80–100)
MONOCYTES %: 7 % (ref 4–12)
MPV: 9.6 FL (ref 7–11)
NEUTROPHILS %: 82 % — ABNORMAL HIGH (ref 41–77)
PLATELET COUNT: 234 K/UL (ref 150–400)
RBC COUNT: 4.2 M/UL — ABNORMAL HIGH (ref 4.0–5.0)
RDW: 14 % (ref 11–15)
WBC COUNT: 8.4 K/UL (ref 4.5–11.0)

## 2021-09-04 LAB — FK506 TACROLIMUS: TACROLIMUS: 6.3 ng/mL (ref 5.0–15.0)

## 2021-09-04 LAB — URINALYSIS DIPSTICK REFLEX TO CULTURE: URINE PH: 6 (ref 5.0–8.0)

## 2021-09-04 LAB — PROTEIN/CR RATIO,UR RAN
UR CREATININE, RAN: 64 mg/dL
UR TOTAL PROTEIN,RAN: 37 mg/dL

## 2021-09-04 LAB — MAGNESIUM: MAGNESIUM: 1.6 mg/dL (ref 1.6–2.6)

## 2021-09-04 LAB — COMPREHENSIVE METABOLIC PANEL
POTASSIUM: 4.3 MMOL/L (ref 3.5–5.1)
SODIUM: 139 MMOL/L (ref 137–147)

## 2021-09-04 LAB — PHOSPHORUS: PHOSPHORUS: 3.3 mg/dL — ABNORMAL HIGH (ref <0.15)

## 2021-09-04 LAB — URIC ACID: URIC ACID: 4.6 mg/dL (ref 2.0–7.0)

## 2021-09-04 LAB — URINALYSIS MICROSCOPIC REFLEX TO CULTURE

## 2021-09-06 ENCOUNTER — Encounter: Admit: 2021-09-06 | Discharge: 2021-09-06 | Payer: MEDICARE

## 2021-09-07 ENCOUNTER — Encounter: Admit: 2021-09-07 | Discharge: 2021-09-07 | Payer: MEDICARE

## 2021-09-15 ENCOUNTER — Encounter: Admit: 2021-09-15 | Discharge: 2021-09-15 | Payer: MEDICARE

## 2021-09-28 ENCOUNTER — Encounter: Admit: 2021-09-28 | Discharge: 2021-09-28 | Payer: MEDICARE

## 2021-09-28 MED FILL — TACROLIMUS 1 MG PO CAP: 1 mg | ORAL | 30 days supply | Qty: 120 | Fill #5 | Status: AC

## 2021-09-28 MED FILL — MYCOPHENOLATE SODIUM 180 MG PO TBEC: 180 mg | ORAL | 30 days supply | Qty: 120 | Fill #4 | Status: AC

## 2021-10-02 ENCOUNTER — Ambulatory Visit: Admit: 2021-10-02 | Discharge: 2021-10-02 | Payer: MEDICARE

## 2021-10-02 ENCOUNTER — Encounter: Admit: 2021-10-02 | Discharge: 2021-10-02 | Payer: MEDICARE

## 2021-10-02 DIAGNOSIS — D849 Immunodeficiency, unspecified: Secondary | ICD-10-CM

## 2021-10-02 DIAGNOSIS — Z940 Kidney transplant status: Secondary | ICD-10-CM

## 2021-10-02 DIAGNOSIS — Z79899 Other long term (current) drug therapy: Secondary | ICD-10-CM

## 2021-10-02 LAB — URINALYSIS DIPSTICK REFLEX TO CULTURE
NITRITE: NEGATIVE
URINE ASCORBIC ACID, UA: NEGATIVE
URINE BILE: NEGATIVE
URINE KETONE: NEGATIVE

## 2021-10-02 LAB — CBC AND DIFF
ABSOLUTE BASO COUNT: 0 K/UL (ref 0–0.20)
ABSOLUTE EOS COUNT: 0.1 K/UL (ref 0–0.45)
ABSOLUTE LYMPH COUNT: 0.7 K/UL — ABNORMAL LOW (ref 1.0–4.8)
ABSOLUTE MONO COUNT: 0.7 K/UL (ref 0–0.80)
ABSOLUTE NEUTROPHIL: 5.4 K/UL (ref 1.8–7.0)
BASOPHILS %: 1 % (ref 0–2)
EOSINOPHILS %: 2 % — ABNORMAL LOW (ref >60)
HEMATOCRIT: 37 % — ABNORMAL HIGH (ref 36–45)
HEMOGLOBIN: 12 g/dL — ABNORMAL HIGH (ref 12.0–15.0)
LYMPHOCYTES %: 10 % — ABNORMAL LOW (ref 24–44)
MCHC: 32 g/dL (ref 32.0–36.0)
MONOCYTES %: 10 % (ref 4–12)
MPV: 9.6 FL (ref 7–11)
NEUTROPHILS %: 77 % (ref 41–77)
PLATELET COUNT: 235 K/UL (ref 150–400)
RBC COUNT: 4.5 M/UL (ref 4.0–5.0)
RDW: 14 % (ref 11–15)
WBC COUNT: 7.1 K/UL (ref 4.5–11.0)

## 2021-10-02 LAB — COMPREHENSIVE METABOLIC PANEL
POTASSIUM: 3.5 MMOL/L (ref 3.5–5.1)
SODIUM: 140 MMOL/L (ref 137–147)

## 2021-10-02 LAB — PHOSPHORUS: PHOSPHORUS: 2.7 mg/dL (ref 2.0–4.5)

## 2021-10-02 LAB — MAGNESIUM: MAGNESIUM: 1.8 mg/dL (ref 1.6–2.6)

## 2021-10-02 LAB — URINALYSIS MICROSCOPIC REFLEX TO CULTURE

## 2021-10-02 LAB — PROTEIN/CR RATIO,UR RAN
PROT CREAT RAT/CAL: 0.5 — ABNORMAL HIGH (ref <0.15)
UR CREATININE, RAN: 62 mg/dL (ref 5.0–8.0)
UR TOTAL PROTEIN,RAN: 32 mg/dL — AB (ref 1.005–1.030)

## 2021-10-02 LAB — FK506 TACROLIMUS: TACROLIMUS: 6.2 ng/mL (ref 5.0–15.0)

## 2021-10-02 LAB — URIC ACID: URIC ACID: 5.1 mg/dL (ref 2.0–7.0)

## 2021-10-05 ENCOUNTER — Encounter: Admit: 2021-10-05 | Discharge: 2021-10-05 | Payer: MEDICARE

## 2021-10-05 DIAGNOSIS — Z940 Kidney transplant status: Secondary | ICD-10-CM

## 2021-10-05 DIAGNOSIS — D849 Immunodeficiency, unspecified: Secondary | ICD-10-CM

## 2021-10-05 DIAGNOSIS — T8610 Unspecified complication of kidney transplant: Secondary | ICD-10-CM

## 2021-10-05 DIAGNOSIS — N189 Chronic kidney disease, unspecified: Secondary | ICD-10-CM

## 2021-10-05 DIAGNOSIS — Z79899 Other long term (current) drug therapy: Secondary | ICD-10-CM

## 2021-10-05 NOTE — Telephone Encounter
Reviewed labs from 10/02/2021 at this time    Hgb 12.2  WBC 7.1  Creatinine 2.12  Mg 1.8  Phos 2.7  Tacrolimus  6.2 (Goal 5-10)  UPC 0.5  CMV not detected  BK not detected     Reviewed labs with patient at this time via mychart. Advised no dose change.

## 2021-10-12 ENCOUNTER — Encounter: Admit: 2021-10-12 | Discharge: 2021-10-12 | Payer: MEDICARE

## 2021-10-26 ENCOUNTER — Encounter: Admit: 2021-10-26 | Discharge: 2021-10-26 | Payer: MEDICARE

## 2021-10-26 MED FILL — TACROLIMUS 1 MG PO CAP: 1 mg | ORAL | 30 days supply | Qty: 120 | Fill #6 | Status: AC

## 2021-10-26 MED FILL — MYCOPHENOLATE SODIUM 180 MG PO TBEC: 180 mg | ORAL | 30 days supply | Qty: 120 | Fill #5 | Status: AC

## 2021-10-30 ENCOUNTER — Ambulatory Visit: Admit: 2021-10-30 | Discharge: 2021-10-30 | Payer: MEDICARE

## 2021-10-30 ENCOUNTER — Encounter: Admit: 2021-10-30 | Discharge: 2021-10-30 | Payer: MEDICARE

## 2021-10-30 DIAGNOSIS — N189 Chronic kidney disease, unspecified: Secondary | ICD-10-CM

## 2021-10-30 DIAGNOSIS — T8610 Unspecified complication of kidney transplant: Secondary | ICD-10-CM

## 2021-10-30 DIAGNOSIS — Z940 Kidney transplant status: Secondary | ICD-10-CM

## 2021-10-30 DIAGNOSIS — D849 Immunodeficiency, unspecified: Secondary | ICD-10-CM

## 2021-10-30 DIAGNOSIS — Z79899 Other long term (current) drug therapy: Secondary | ICD-10-CM

## 2021-10-30 LAB — IRON + BINDING CAPACITY + %SAT+ FERRITIN
% SATURATION: 18 % — ABNORMAL LOW (ref 28–42)
FERRITIN: 449 ng/mL — ABNORMAL HIGH (ref 10–200)
IRON BINDING: 273 ug/dL — ABNORMAL LOW (ref 270–380)
IRON: 49 ug/dL — ABNORMAL LOW (ref 50–160)

## 2021-10-30 LAB — CBC AND DIFF
ABSOLUTE BASO COUNT: 0 K/UL (ref 0–0.20)
ABSOLUTE EOS COUNT: 0.1 K/UL (ref 0–0.45)
ABSOLUTE LYMPH COUNT: 0.5 K/UL — ABNORMAL LOW (ref 1.0–4.8)
ABSOLUTE MONO COUNT: 0.5 K/UL (ref 0–0.80)
ABSOLUTE NEUTROPHIL: 5.7 K/UL (ref 1.8–7.0)
BASOPHILS %: 1 % (ref 0–2)
EOSINOPHILS %: 2 % — ABNORMAL LOW (ref >60)
MONOCYTES %: 7 % (ref 4–12)
RBC COUNT: 4.2 M/UL — ABNORMAL HIGH (ref 4.0–5.0)
WBC COUNT: 7 K/UL (ref 4.5–11.0)

## 2021-10-30 LAB — URINALYSIS DIPSTICK REFLEX TO CULTURE
PROTEIN,UA: NEGATIVE
URINE PH: 6 (ref 5.0–8.0)

## 2021-10-30 LAB — COMPREHENSIVE METABOLIC PANEL
POTASSIUM: 4 MMOL/L (ref 3.5–5.1)
SODIUM: 141 MMOL/L (ref 137–147)

## 2021-10-30 LAB — PARATHYROID HORMONE: PTH HORMONE: 195 pg/mL — ABNORMAL HIGH (ref 10–65)

## 2021-10-30 LAB — LIPID PROFILE
CHOLESTEROL: 96 mg/dL (ref <200)
LDL: 44 mg/dL (ref <100)
NON HDL CHOLESTEROL: 59 mg/dL — ABNORMAL LOW (ref 24–44)
TRIGLYCERIDES: 86 mg/dL (ref <150)

## 2021-10-30 LAB — MAGNESIUM: MAGNESIUM: 1.8 mg/dL (ref 1.6–2.6)

## 2021-10-30 LAB — PHOSPHORUS: PHOSPHORUS: 3.2 mg/dL — ABNORMAL HIGH (ref <0.15)

## 2021-10-30 LAB — HEMOGLOBIN A1C: HEMOGLOBIN A1C: 11 % — ABNORMAL HIGH (ref >40)

## 2021-10-30 LAB — URINALYSIS MICROSCOPIC REFLEX TO CULTURE

## 2021-10-30 LAB — PROTEIN/CR RATIO,UR RAN
UR CREATININE, RAN: 41 mg/dL
UR TOTAL PROTEIN,RAN: 28 mg/dL

## 2021-10-30 LAB — URIC ACID: URIC ACID: 4.8 mg/dL — AB (ref 2.0–7.0)

## 2021-10-30 LAB — FK506 TACROLIMUS: TACROLIMUS: 6.6 ng/mL — ABNORMAL LOW (ref 5.0–15.0)

## 2021-11-01 ENCOUNTER — Encounter: Admit: 2021-11-01 | Discharge: 2021-11-01 | Payer: MEDICARE

## 2021-11-01 NOTE — Telephone Encounter
Reviewed labs from 10/30/2021 at this time    Hgb 11.3  WBC 7.0  Creatinine 2.017  Mg 1.8  Phos 3.2  Tacrolimus 6.6 (Goal 5-10)  UPC 0.7  Iron 49  %sat 18  TIBC 273  Ferritin 449  A1C 11.6  PTH 195.9  Cholesterol 96  Triglycerides 86  HDL 37  LDL 44  Vit D 33.8    Received the following orders from Dr. Lyndel Safe :  Start cholecalciferol 2000 units daily (vit D3)   Start ferrous sulfate 325 mg daily (iron supplement)    Reviewed labs with patient at this time via mychart. Asked patient if she is taking above supplements as her labs do not reflect this

## 2021-11-02 ENCOUNTER — Encounter: Admit: 2021-11-02 | Discharge: 2021-11-02 | Payer: MEDICARE

## 2021-11-02 NOTE — Telephone Encounter
TC to Dr. Laverle Patter office and made follow up appt for 12/01/2021 @ 1530 for patient r/t hemoglobin A1C of 11.6.     Results faxed to Dr. Talbert Cage at [phone]. Mychart message to patient letting her know of appointment date/time.

## 2021-11-23 ENCOUNTER — Encounter: Admit: 2021-11-23 | Discharge: 2021-11-23 | Payer: MEDICARE

## 2021-11-23 MED FILL — MYCOPHENOLATE SODIUM 180 MG PO TBEC: 180 mg | ORAL | 30 days supply | Qty: 120 | Fill #6 | Status: AC

## 2021-11-23 MED FILL — TACROLIMUS 1 MG PO CAP: 1 mg | ORAL | 30 days supply | Qty: 120 | Fill #7 | Status: AC

## 2021-12-04 ENCOUNTER — Encounter: Admit: 2021-12-04 | Discharge: 2021-12-04 | Payer: MEDICARE

## 2021-12-04 ENCOUNTER — Ambulatory Visit: Admit: 2021-12-04 | Discharge: 2021-12-04 | Payer: MEDICARE

## 2021-12-04 DIAGNOSIS — Z940 Kidney transplant status: Secondary | ICD-10-CM

## 2021-12-04 DIAGNOSIS — D849 Immunodeficiency, unspecified: Secondary | ICD-10-CM

## 2021-12-04 DIAGNOSIS — Z79899 Other long term (current) drug therapy: Secondary | ICD-10-CM

## 2021-12-04 LAB — FK506 TACROLIMUS: TACROLIMUS: 6.7 ng/mL (ref 5.0–15.0)

## 2021-12-04 LAB — URINALYSIS DIPSTICK REFLEX TO CULTURE
GLUCOSE,UA: NEGATIVE
NITRITE: NEGATIVE
URINE ASCORBIC ACID, UA: NEGATIVE
URINE BILE: NEGATIVE
URINE KETONE: NEGATIVE
URINE PH: 6 (ref 5.0–8.0)
URINE SPEC GRAVITY: 1 (ref 1.005–1.030)

## 2021-12-04 LAB — COMPREHENSIVE METABOLIC PANEL
ALBUMIN: 3.9 g/dL — ABNORMAL LOW (ref 3.5–5.0)
ALK PHOSPHATASE: 78 U/L (ref 25–110)
ALT: 18 U/L (ref 7–56)
ANION GAP: 9 K/UL — ABNORMAL LOW (ref 3–12)
AST: 20 U/L (ref 7–40)
CO2: 25 MMOL/L (ref 21–30)
CREATININE: 2.4 mg/dL — ABNORMAL HIGH (ref 0.4–1.00)
EGFR: 23 mL/min — ABNORMAL LOW (ref >60)
GLUCOSE,PANEL: 120 mg/dL — ABNORMAL HIGH (ref 70–100)
POTASSIUM: 4.4 MMOL/L (ref 3.5–5.1)
SODIUM: 141 MMOL/L — ABNORMAL LOW (ref 137–147)
TOTAL BILIRUBIN: 0.4 mg/dL — ABNORMAL HIGH (ref 0.3–1.2)
TOTAL PROTEIN: 7.3 g/dL (ref 6.0–8.0)

## 2021-12-04 LAB — PROTEIN/CR RATIO,UR RAN
UR CREATININE, RAN: 52 mg/dL
UR TOTAL PROTEIN,RAN: 34 mg/dL

## 2021-12-04 LAB — URINALYSIS MICROSCOPIC REFLEX TO CULTURE

## 2021-12-04 LAB — PHOSPHORUS: PHOSPHORUS: 3.5 mg/dL — ABNORMAL HIGH (ref 2.0–4.5)

## 2021-12-04 LAB — CBC AND DIFF
ABSOLUTE BASO COUNT: 0 K/UL (ref 0–0.20)
ABSOLUTE EOS COUNT: 0.1 K/UL (ref 0–0.45)
RBC COUNT: 4.3 M/UL (ref 4.0–5.0)
WBC COUNT: 6.8 K/UL (ref 4.5–11.0)

## 2021-12-04 LAB — URIC ACID: URIC ACID: 5.2 mg/dL (ref 2.0–7.0)

## 2021-12-04 LAB — MAGNESIUM: MAGNESIUM: 1.6 mg/dL (ref 1.6–2.6)

## 2021-12-07 ENCOUNTER — Encounter: Admit: 2021-12-07 | Discharge: 2021-12-07 | Payer: MEDICARE

## 2021-12-07 NOTE — Telephone Encounter
Reviewed labs from 12/04/2021 at this time    Hgb 11.6  WBC 6.8  Creatinine  2.4 elevated   Mg 1.6  Phos 3.5  Tacrolimus  6.7 (Goal 5-10)  UPC 0.7  PTH 195.9  Cholesterol 96  Triglycerides 86  HDL 37  LDL 44  Vit D 33.8  CMV not detected    Received the following orders from Dr. Lyndel Safe :  No dose change     Reviewed labs with patient at this time via mychart

## 2021-12-13 ENCOUNTER — Encounter: Admit: 2021-12-13 | Discharge: 2021-12-13 | Payer: MEDICARE

## 2021-12-13 DIAGNOSIS — R609 Edema, unspecified: Secondary | ICD-10-CM

## 2021-12-13 DIAGNOSIS — R0602 Shortness of breath: Secondary | ICD-10-CM

## 2021-12-13 DIAGNOSIS — D849 Immunodeficiency, unspecified: Secondary | ICD-10-CM

## 2021-12-13 DIAGNOSIS — Z940 Kidney transplant status: Secondary | ICD-10-CM

## 2021-12-13 DIAGNOSIS — T8610 Unspecified complication of kidney transplant: Secondary | ICD-10-CM

## 2021-12-15 ENCOUNTER — Encounter: Admit: 2021-12-15 | Discharge: 2021-12-15 | Payer: MEDICARE

## 2021-12-22 ENCOUNTER — Encounter: Admit: 2021-12-22 | Discharge: 2021-12-22 | Payer: MEDICARE

## 2021-12-22 MED FILL — MYCOPHENOLATE SODIUM 180 MG PO TBEC: 180 mg | ORAL | 30 days supply | Qty: 120 | Fill #7 | Status: AC

## 2021-12-22 MED FILL — TACROLIMUS 1 MG PO CAP: 1 mg | ORAL | 30 days supply | Qty: 120 | Fill #8 | Status: AC

## 2021-12-23 ENCOUNTER — Encounter: Admit: 2021-12-23 | Discharge: 2021-12-23 | Payer: MEDICARE

## 2021-12-23 MED FILL — PEN NEEDLE, DIABETIC 32 GAUGE X 5/32" MISC NDLE: 32 gauge x 5/" | 60 days supply | Qty: 300 | Fill #2 | Status: AC

## 2021-12-24 ENCOUNTER — Encounter: Admit: 2021-12-24 | Discharge: 2021-12-24 | Payer: MEDICARE

## 2021-12-25 ENCOUNTER — Encounter: Admit: 2021-12-25 | Discharge: 2021-12-25 | Payer: MEDICARE

## 2021-12-26 ENCOUNTER — Encounter: Admit: 2021-12-26 | Discharge: 2021-12-26 | Payer: MEDICARE

## 2021-12-26 MED FILL — PEN NEEDLE, DIABETIC 32 GAUGE X 5/32" MISC NDLE: 32 gauge x 5/" | 60 days supply | Qty: 300 | Fill #2 | Status: AC

## 2021-12-27 ENCOUNTER — Encounter: Admit: 2021-12-27 | Discharge: 2021-12-27 | Payer: MEDICARE

## 2022-01-08 ENCOUNTER — Encounter: Admit: 2022-01-08 | Discharge: 2022-01-08 | Payer: MEDICARE

## 2022-01-08 ENCOUNTER — Ambulatory Visit: Admit: 2022-01-08 | Discharge: 2022-01-08 | Payer: MEDICARE

## 2022-01-08 DIAGNOSIS — D849 Immunodeficiency, unspecified: Secondary | ICD-10-CM

## 2022-01-08 DIAGNOSIS — Z940 Kidney transplant status: Secondary | ICD-10-CM

## 2022-01-08 DIAGNOSIS — Z79899 Other long term (current) drug therapy: Secondary | ICD-10-CM

## 2022-01-08 LAB — PROTEIN/CR RATIO,UR RAN
PROT CREAT RAT/CAL: 0.7 — ABNORMAL HIGH (ref <0.15)
UR CREATININE, RAN: 55 mg/dL (ref 5.0–8.0)
UR TOTAL PROTEIN,RAN: 36 mg/dL — AB (ref 1.005–1.030)

## 2022-01-08 LAB — PHOSPHORUS: PHOSPHORUS: 3 mg/dL — ABNORMAL HIGH (ref 2.0–4.5)

## 2022-01-08 LAB — COMPREHENSIVE METABOLIC PANEL
ALBUMIN: 3.8 g/dL — ABNORMAL LOW (ref 3.5–5.0)
ALK PHOSPHATASE: 84 U/L (ref 25–110)
ALT: 16 U/L (ref 7–56)
ANION GAP: 9 K/UL — ABNORMAL LOW (ref 3–12)
AST: 16 U/L (ref 7–40)
CO2: 25 MMOL/L (ref 21–30)
CREATININE: 2.1 mg/dL — ABNORMAL HIGH (ref 0.4–1.00)
EGFR: 26 mL/min — ABNORMAL LOW (ref >60)
GLUCOSE,PANEL: 181 mg/dL — ABNORMAL HIGH (ref 70–100)
POTASSIUM: 4.4 MMOL/L — ABNORMAL LOW (ref 3.5–5.1)
SODIUM: 140 MMOL/L — ABNORMAL LOW (ref 137–147)
TOTAL BILIRUBIN: 0.5 mg/dL (ref 0.3–1.2)
TOTAL PROTEIN: 7.3 g/dL (ref 6.0–8.0)

## 2022-01-08 LAB — CBC AND DIFF
ABSOLUTE BASO COUNT: 0 K/UL (ref 0–0.20)
ABSOLUTE EOS COUNT: 0.1 K/UL (ref 0–0.45)
RBC COUNT: 4.3 M/UL (ref 4.0–5.0)
WBC COUNT: 4.5 K/UL (ref 4.5–11.0)

## 2022-01-08 LAB — URINALYSIS MICROSCOPIC REFLEX TO CULTURE

## 2022-01-08 LAB — FK506 TACROLIMUS: TACROLIMUS: 6.4 ng/mL (ref 5.0–15.0)

## 2022-01-08 LAB — MAGNESIUM: MAGNESIUM: 1.7 mg/dL (ref 1.6–2.6)

## 2022-01-08 LAB — URINALYSIS DIPSTICK REFLEX TO CULTURE
NITRITE: NEGATIVE
URINE ASCORBIC ACID, UA: NEGATIVE
URINE BILE: NEGATIVE
URINE KETONE: NEGATIVE

## 2022-01-08 LAB — URIC ACID: URIC ACID: 4.8 mg/dL — ABNORMAL LOW (ref 2.0–7.0)

## 2022-01-12 ENCOUNTER — Encounter: Admit: 2022-01-12 | Discharge: 2022-01-12 | Payer: MEDICARE

## 2022-01-12 DIAGNOSIS — T8610 Unspecified complication of kidney transplant: Secondary | ICD-10-CM

## 2022-01-12 DIAGNOSIS — D849 Immunodeficiency, unspecified: Secondary | ICD-10-CM

## 2022-01-12 DIAGNOSIS — Z79899 Other long term (current) drug therapy: Secondary | ICD-10-CM

## 2022-01-12 DIAGNOSIS — Z940 Kidney transplant status: Secondary | ICD-10-CM

## 2022-01-13 ENCOUNTER — Encounter: Admit: 2022-01-13 | Discharge: 2022-01-13 | Payer: MEDICARE

## 2022-01-13 NOTE — Telephone Encounter
Reviewed labs from 01/08/2022 at this time    Hgb 11.5  WBC 4.5  Creatinine 2.19  Mg 1.7  Phos 3.0  Tacrolimus  6.4 (Goal 5-10)  UPC  0.7    Received the following orders from Dr. Lyndel Safe :  No dose change     Reviewed labs with patient at this time via mychart

## 2022-01-24 ENCOUNTER — Encounter: Admit: 2022-01-24 | Discharge: 2022-01-24 | Payer: MEDICARE

## 2022-01-24 MED FILL — MYCOPHENOLATE SODIUM 180 MG PO TBEC: 180 mg | ORAL | 30 days supply | Qty: 120 | Fill #8 | Status: AC

## 2022-01-24 MED FILL — TACROLIMUS 1 MG PO CAP: 1 mg | ORAL | 30 days supply | Qty: 120 | Fill #9 | Status: AC

## 2022-01-25 ENCOUNTER — Encounter: Admit: 2022-01-25 | Discharge: 2022-01-25 | Payer: MEDICARE

## 2022-02-21 ENCOUNTER — Encounter: Admit: 2022-02-21 | Discharge: 2022-02-21 | Payer: MEDICARE

## 2022-02-21 MED FILL — TACROLIMUS 1 MG PO CAP: 1 mg | ORAL | 30 days supply | Qty: 120 | Fill #10 | Status: AC

## 2022-02-21 MED FILL — MYCOPHENOLATE SODIUM 180 MG PO TBEC: 180 mg | ORAL | 30 days supply | Qty: 120 | Fill #9 | Status: AC

## 2022-02-22 ENCOUNTER — Encounter: Admit: 2022-02-22 | Discharge: 2022-02-22 | Payer: MEDICARE

## 2022-03-03 ENCOUNTER — Encounter: Admit: 2022-03-03 | Discharge: 2022-03-03 | Payer: MEDICARE

## 2022-03-13 ENCOUNTER — Encounter: Admit: 2022-03-13 | Discharge: 2022-03-13 | Payer: MEDICARE

## 2022-03-16 ENCOUNTER — Encounter: Admit: 2022-03-16 | Discharge: 2022-03-16 | Payer: MEDICARE

## 2022-03-17 ENCOUNTER — Encounter: Admit: 2022-03-17 | Discharge: 2022-03-17 | Payer: MEDICARE

## 2022-03-18 ENCOUNTER — Encounter: Admit: 2022-03-18 | Discharge: 2022-03-18 | Payer: MEDICARE

## 2022-03-18 NOTE — Progress Notes
Contacted [facility]era to refill their medication(s) TACROLIMUS SL (PROGRAF SL) 1 MG CAP UK[facility], MYCOPHENOLATE SODIUM 180 MG PO TBEC.    We were unable to reach the patient after three attempts. At this time, we will no longer attempt to contact the patient for the refill. The patient may be re-enrolled in the pharmacy refill management program at any time by contacting the pharmacy or refilling their medication. Ambulatory pharmacist notified.    [place]  [phone]

## 2022-03-28 ENCOUNTER — Encounter: Admit: 2022-03-28 | Discharge: 2022-03-28 | Payer: MEDICARE

## 2022-03-28 MED FILL — TACROLIMUS 1 MG PO CAP: 1 mg | ORAL | 30 days supply | Qty: 120 | Fill #11 | Status: AC

## 2022-03-28 MED FILL — MYCOPHENOLATE SODIUM 180 MG PO TBEC: 180 mg | ORAL | 30 days supply | Qty: 120 | Fill #10 | Status: AC

## 2022-03-28 NOTE — Telephone Encounter
Patient called and would like to reschedule her Telehealth appointment for tomorrow. Please call [phone]. Thank you

## 2022-04-01 ENCOUNTER — Encounter: Admit: 2022-04-01 | Discharge: 2022-04-01 | Payer: MEDICARE

## 2022-04-02 ENCOUNTER — Encounter: Admit: 2022-04-02 | Discharge: 2022-04-02 | Payer: MEDICARE

## 2022-04-02 MED FILL — PEN NEEDLE, DIABETIC 32 GAUGE X 5/32" MISC NDLE: 32 gauge x 5/" | 60 days supply | Qty: 300 | Fill #3 | Status: AC

## 2022-04-17 ENCOUNTER — Encounter: Admit: 2022-04-17 | Discharge: 2022-04-17 | Payer: MEDICARE

## 2022-05-02 ENCOUNTER — Encounter: Admit: 2022-05-02 | Discharge: 2022-05-02 | Payer: MEDICARE

## 2022-05-02 MED FILL — MYCOPHENOLATE SODIUM 180 MG PO TBEC: 180 mg | ORAL | 30 days supply | Qty: 120 | Fill #11 | Status: AC

## 2022-05-03 ENCOUNTER — Encounter: Admit: 2022-05-03 | Discharge: 2022-05-03 | Payer: MEDICARE

## 2022-05-03 MED FILL — TACROLIMUS 1 MG PO CAP: 1 mg | ORAL | 30 days supply | Qty: 120 | Fill #12 | Status: AC

## 2022-05-22 ENCOUNTER — Encounter: Admit: 2022-05-22 | Discharge: 2022-05-22 | Payer: MEDICARE

## 2022-05-23 ENCOUNTER — Encounter: Admit: 2022-05-23 | Discharge: 2022-05-23 | Payer: MEDICARE

## 2022-05-25 ENCOUNTER — Encounter: Admit: 2022-05-25 | Discharge: 2022-05-25 | Payer: MEDICARE

## 2022-05-25 DIAGNOSIS — Z940 Kidney transplant status: Secondary | ICD-10-CM

## 2022-05-25 MED ORDER — MYCOPHENOLATE SODIUM 180 MG PO TBEC
360 mg | ORAL_TABLET | Freq: Two times a day (BID) | ORAL | 3 refills
Start: 2022-05-25 — End: ?

## 2022-05-25 MED ORDER — TACROLIMUS 1 MG PO CAP
2 mg | ORAL_CAPSULE | Freq: Two times a day (BID) | ORAL | 3 refills
Start: 2022-05-25 — End: ?

## 2022-05-26 ENCOUNTER — Encounter: Admit: 2022-05-26 | Discharge: 2022-05-26 | Payer: MEDICARE

## 2022-05-27 ENCOUNTER — Encounter: Admit: 2022-05-27 | Discharge: 2022-05-27 | Payer: MEDICARE

## 2022-05-30 ENCOUNTER — Encounter: Admit: 2022-05-30 | Discharge: 2022-05-30 | Payer: MEDICARE

## 2022-05-30 MED FILL — MYCOPHENOLATE SODIUM 180 MG PO TBEC: 180 mg | ORAL | 30 days supply | Qty: 120 | Fill #1 | Status: AC

## 2022-05-30 MED FILL — TACROLIMUS 1 MG PO CAP: 1 mg | ORAL | 30 days supply | Qty: 120 | Fill #1 | Status: AC

## 2022-06-17 ENCOUNTER — Encounter: Admit: 2022-06-17 | Discharge: 2022-06-17 | Payer: MEDICARE

## 2022-06-21 ENCOUNTER — Encounter: Admit: 2022-06-21 | Discharge: 2022-06-21 | Payer: MEDICARE

## 2022-06-27 ENCOUNTER — Encounter: Admit: 2022-06-27 | Discharge: 2022-06-27 | Payer: MEDICARE

## 2022-06-27 MED FILL — MYCOPHENOLATE SODIUM 180 MG PO TBEC: 180 mg | ORAL | 30 days supply | Qty: 120 | Fill #2 | Status: AC

## 2022-06-27 MED FILL — TACROLIMUS 1 MG PO CAP: 1 mg | ORAL | 30 days supply | Qty: 120 | Fill #2 | Status: AC

## 2022-06-28 ENCOUNTER — Encounter: Admit: 2022-06-28 | Discharge: 2022-06-28 | Payer: MEDICARE

## 2022-07-17 ENCOUNTER — Encounter: Admit: 2022-07-17 | Discharge: 2022-07-17 | Payer: MEDICARE

## 2022-07-19 ENCOUNTER — Encounter: Admit: 2022-07-19 | Discharge: 2022-07-19 | Payer: MEDICARE

## 2022-07-20 ENCOUNTER — Encounter: Admit: 2022-07-20 | Discharge: 2022-07-20 | Payer: MEDICARE

## 2022-07-21 ENCOUNTER — Encounter: Admit: 2022-07-21 | Discharge: 2022-07-21 | Payer: MEDICARE

## 2022-07-21 NOTE — Progress Notes
Contacted [facility] Sera to refill their medication(s) MYCOPHENOLATE SODIUM 180 MG PO TBEC, TACROLIMUS 1 MG PO CAP.    We were unable to reach the patient after three attempts. At this time, we will no longer attempt to contact the patient for the refill. The patient may be re-enrolled in the pharmacy refill management program at any time by contacting the pharmacy or refilling their medication. Ambulatory pharmacist notified.    [place]  [phone]

## 2022-07-22 ENCOUNTER — Encounter: Admit: 2022-07-22 | Discharge: 2022-07-22 | Payer: MEDICARE

## 2022-07-22 MED FILL — TACROLIMUS 1 MG PO CAP: 1 mg | ORAL | 30 days supply | Qty: 120 | Fill #3 | Status: AC

## 2022-07-22 MED FILL — MYCOPHENOLATE SODIUM 180 MG PO TBEC: 180 mg | ORAL | 30 days supply | Qty: 120 | Fill #3 | Status: AC

## 2022-07-26 ENCOUNTER — Encounter: Admit: 2022-07-26 | Discharge: 2022-07-26 | Payer: MEDICARE

## 2022-08-11 ENCOUNTER — Encounter: Admit: 2022-08-11 | Discharge: 2022-08-11 | Payer: MEDICARE

## 2022-08-17 ENCOUNTER — Encounter: Admit: 2022-08-17 | Discharge: 2022-08-17 | Payer: MEDICARE

## 2022-08-18 ENCOUNTER — Encounter: Admit: 2022-08-18 | Discharge: 2022-08-18 | Payer: MEDICARE

## 2022-08-22 ENCOUNTER — Encounter: Admit: 2022-08-22 | Discharge: 2022-08-22 | Payer: MEDICARE

## 2022-08-23 ENCOUNTER — Encounter: Admit: 2022-08-23 | Discharge: 2022-08-23 | Payer: MEDICARE

## 2022-08-23 NOTE — Progress Notes
Contacted [facility] Sera to refill their medication(s) MYCOPHENOLATE SODIUM, TACROLIMUS.    We were unable to reach the patient after three attempts. At this time, we will no longer attempt to contact the patient for the refill. The patient may be re-enrolled in the pharmacy refill management program at any time by contacting the pharmacy or refilling their medication. Ambulatory pharmacist notified.    [place]  [phone]

## 2022-09-07 ENCOUNTER — Encounter: Admit: 2022-09-07 | Discharge: 2022-09-07 | Payer: MEDICARE

## 2022-09-07 MED FILL — TACROLIMUS 1 MG PO CAP: 1 mg | ORAL | 30 days supply | Qty: 120 | Fill #4 | Status: AC

## 2022-09-07 MED FILL — MYCOPHENOLATE SODIUM 180 MG PO TBEC: 180 mg | ORAL | 30 days supply | Qty: 120 | Fill #4 | Status: AC

## 2022-09-19 ENCOUNTER — Encounter: Admit: 2022-09-19 | Discharge: 2022-09-19 | Payer: MEDICARE

## 2022-09-19 MED ORDER — PEN NEEDLE, DIABETIC 32 GAUGE X 5/32" MISC NDLE
1 | 3 refills | 90.00000 days | Status: AC | PRN
Start: 2022-09-19 — End: ?

## 2022-09-21 ENCOUNTER — Encounter: Admit: 2022-09-21 | Discharge: 2022-09-21 | Payer: MEDICARE

## 2022-09-22 ENCOUNTER — Encounter: Admit: 2022-09-22 | Discharge: 2022-09-22 | Payer: MEDICARE

## 2022-09-23 ENCOUNTER — Encounter: Admit: 2022-09-23 | Discharge: 2022-09-23 | Payer: MEDICARE

## 2022-09-23 MED FILL — PEN NEEDLE, DIABETIC 32 GAUGE X 5/32" MISC NDLE: 32 gauge x 5/" | 60 days supply | Qty: 300 | Fill #1 | Status: AC

## 2022-09-26 ENCOUNTER — Encounter: Admit: 2022-09-26 | Discharge: 2022-09-26 | Payer: MEDICARE

## 2022-09-27 ENCOUNTER — Encounter: Admit: 2022-09-27 | Discharge: 2022-09-27 | Payer: MEDICARE

## 2022-10-03 ENCOUNTER — Encounter: Admit: 2022-10-03 | Discharge: 2022-10-03 | Payer: MEDICARE

## 2022-10-03 MED FILL — TACROLIMUS 1 MG PO CAP: 1 mg | ORAL | 30 days supply | Qty: 120 | Fill #5 | Status: AC

## 2022-10-03 MED FILL — MYCOPHENOLATE SODIUM 180 MG PO TBEC: 180 mg | ORAL | 30 days supply | Qty: 120 | Fill #5 | Status: AC

## 2022-10-21 ENCOUNTER — Encounter: Admit: 2022-10-21 | Discharge: 2022-10-21 | Payer: MEDICARE

## 2022-10-25 ENCOUNTER — Encounter: Admit: 2022-10-25 | Discharge: 2022-10-25 | Payer: MEDICARE

## 2022-10-26 ENCOUNTER — Encounter: Admit: 2022-10-26 | Discharge: 2022-10-26 | Payer: MEDICARE

## 2022-10-26 MED FILL — TACROLIMUS 1 MG PO CAP: 1 mg | ORAL | 30 days supply | Qty: 120 | Fill #6 | Status: AC

## 2022-10-26 MED FILL — MYCOPHENOLATE SODIUM 180 MG PO TBEC: 180 mg | ORAL | 30 days supply | Qty: 120 | Fill #6 | Status: AC

## 2022-11-15 ENCOUNTER — Encounter: Admit: 2022-11-15 | Discharge: 2022-11-15 | Payer: MEDICARE

## 2022-11-17 ENCOUNTER — Encounter: Admit: 2022-11-17 | Discharge: 2022-11-17 | Payer: MEDICARE

## 2022-11-17 DIAGNOSIS — Z940 Kidney transplant status: Secondary | ICD-10-CM

## 2022-11-17 MED ORDER — TACROLIMUS 1 MG PO CAP
2 mg | ORAL_CAPSULE | Freq: Two times a day (BID) | ORAL | 3 refills
Start: 2022-11-17 — End: ?

## 2022-11-17 MED ORDER — MYCOPHENOLATE SODIUM 180 MG PO TBEC
360 mg | ORAL_TABLET | Freq: Two times a day (BID) | ORAL | 3 refills
Start: 2022-11-17 — End: ?

## 2022-11-21 ENCOUNTER — Encounter: Admit: 2022-11-21 | Discharge: 2022-11-21 | Payer: MEDICARE

## 2022-11-22 ENCOUNTER — Encounter: Admit: 2022-11-22 | Discharge: 2022-11-22 | Payer: MEDICARE

## 2022-11-24 ENCOUNTER — Encounter: Admit: 2022-11-24 | Discharge: 2022-11-24 | Payer: MEDICARE

## 2022-11-28 ENCOUNTER — Encounter: Admit: 2022-11-28 | Discharge: 2022-11-28 | Payer: MEDICARE

## 2022-11-28 DIAGNOSIS — E1121 Type 2 diabetes mellitus with diabetic nephropathy: Secondary | ICD-10-CM

## 2022-11-28 DIAGNOSIS — Z940 Kidney transplant status: Secondary | ICD-10-CM

## 2022-11-28 DIAGNOSIS — D849 Immunodeficiency, unspecified: Secondary | ICD-10-CM

## 2022-11-28 DIAGNOSIS — E7849 Other hyperlipidemia: Secondary | ICD-10-CM

## 2022-11-28 MED FILL — TACROLIMUS 1 MG PO CAP: 1 mg | ORAL | 90 days supply | Qty: 360 | Fill #1 | Status: AC

## 2022-11-28 MED FILL — MYCOPHENOLATE SODIUM 180 MG PO TBEC: 180 mg | ORAL | 90 days supply | Qty: 360 | Fill #1 | Status: AC

## 2022-12-06 ENCOUNTER — Encounter: Admit: 2022-12-06 | Discharge: 2022-12-06 | Payer: MEDICARE

## 2022-12-07 ENCOUNTER — Encounter: Admit: 2022-12-07 | Discharge: 2022-12-07 | Payer: MEDICARE

## 2022-12-12 ENCOUNTER — Encounter: Admit: 2022-12-12 | Discharge: 2022-12-12 | Payer: MEDICARE

## 2022-12-13 ENCOUNTER — Encounter: Admit: 2022-12-13 | Discharge: 2022-12-13 | Payer: MEDICARE

## 2022-12-20 ENCOUNTER — Encounter: Admit: 2022-12-20 | Discharge: 2022-12-20 | Payer: MEDICARE

## 2022-12-23 ENCOUNTER — Encounter: Admit: 2022-12-23 | Discharge: 2022-12-23 | Payer: MEDICARE

## 2022-12-29 ENCOUNTER — Encounter: Admit: 2022-12-29 | Discharge: 2022-12-29 | Payer: MEDICARE

## 2023-02-15 ENCOUNTER — Encounter: Admit: 2023-02-15 | Discharge: 2023-02-15 | Payer: MEDICARE

## 2023-02-16 ENCOUNTER — Encounter: Admit: 2023-02-16 | Discharge: 2023-02-16 | Payer: MEDICARE

## 2023-02-24 ENCOUNTER — Encounter: Admit: 2023-02-24 | Discharge: 2023-02-24 | Payer: MEDICARE

## 2023-02-27 ENCOUNTER — Encounter: Admit: 2023-02-27 | Discharge: 2023-02-27 | Payer: MEDICARE

## 2023-02-27 MED FILL — MYCOPHENOLATE SODIUM 180 MG PO TBEC: 180 mg | ORAL | 90 days supply | Qty: 360 | Fill #2 | Status: AC

## 2023-02-27 MED FILL — TACROLIMUS 1 MG PO CAP: 1 mg | ORAL | 90 days supply | Qty: 360 | Fill #2 | Status: AC

## 2023-04-11 ENCOUNTER — Encounter: Admit: 2023-04-11 | Discharge: 2023-04-11 | Payer: MEDICARE

## 2023-04-13 ENCOUNTER — Inpatient Hospital Stay: Admit: 2023-04-13 | Payer: MEDICARE

## 2023-04-13 ENCOUNTER — Inpatient Hospital Stay: Admit: 2023-04-13 | Discharge: 2023-04-13 | Payer: MEDICARE

## 2023-04-13 ENCOUNTER — Encounter: Admit: 2023-04-13 | Discharge: 2023-04-13 | Payer: MEDICARE

## 2023-04-13 DIAGNOSIS — E872 Metabolic acidosis: Secondary | ICD-10-CM

## 2023-04-13 LAB — POC GLUCOSE: POC GLUCOSE: 234 mg/dL — ABNORMAL HIGH (ref 70–100)

## 2023-04-13 MED ORDER — INSULIN ASPART 100 UNIT/ML SC FLEXPEN
0-6 [IU] | Freq: Before meals | SUBCUTANEOUS | 0 refills | Status: AC
Start: 2023-04-13 — End: ?
  Administered 2023-04-14: 03:00:00 1 [IU] via SUBCUTANEOUS

## 2023-04-13 MED ORDER — DEXTROSE 50 % IN WATER (D50W) IV SYRG
12.5-25 g | INTRAVENOUS | 0 refills | Status: AC | PRN
Start: 2023-04-13 — End: ?

## 2023-04-13 MED ORDER — DEXTROSE 50 % IN WATER (D50W) IV SYRG
12.5-25 g | INTRAVENOUS | 0 refills | Status: DC | PRN
Start: 2023-04-13 — End: 2023-04-14

## 2023-04-13 MED ORDER — POLYETHYLENE GLYCOL 3350 17 GRAM PO PWPK
1 | Freq: Every day | ORAL | 0 refills | Status: AC | PRN
Start: 2023-04-13 — End: ?

## 2023-04-13 MED ORDER — ASPIRIN 81 MG PO TBEC
81 mg | Freq: Every day | ORAL | 0 refills | Status: AC
Start: 2023-04-13 — End: ?
  Administered 2023-04-14 – 2023-04-20 (×6): 81 mg via ORAL
  Administered 2023-04-20: 14:00:00
  Administered 2023-04-21: 14:00:00 81 mg via ORAL

## 2023-04-13 MED ORDER — ONDANSETRON 4 MG PO TBDI
4 mg | ORAL | 0 refills | Status: AC | PRN
Start: 2023-04-13 — End: ?

## 2023-04-13 MED ORDER — ONDANSETRON HCL (PF) 4 MG/2 ML IJ SOLN
4 mg | INTRAVENOUS | 0 refills | Status: AC | PRN
Start: 2023-04-13 — End: ?
  Administered 2023-04-19: 23:00:00 4 mg via INTRAVENOUS

## 2023-04-13 MED ORDER — FERROUS SULFATE 325 MG (65 MG IRON) PO TAB
325 mg | Freq: Every evening | ORAL | 0 refills | Status: AC
Start: 2023-04-13 — End: ?
  Administered 2023-04-14 – 2023-04-20 (×7): 325 mg via ORAL
  Administered 2023-04-21: 02:00:00
  Administered 2023-04-21: 03:00:00 325 mg via ORAL

## 2023-04-13 MED ORDER — POTASSIUM CHLORIDE 20 MEQ PO TBTQ
40 meq | Freq: Once | ORAL | 0 refills | Status: CP
Start: 2023-04-13 — End: ?
  Administered 2023-04-14: 03:00:00 40 meq via ORAL

## 2023-04-13 MED ORDER — HEPARIN, PORCINE (PF) 5,000 UNIT/0.5 ML IJ SYRG
5000 [IU] | SUBCUTANEOUS | 0 refills | Status: AC
Start: 2023-04-13 — End: ?
  Administered 2023-04-13 – 2023-04-19 (×14): 5000 [IU] via SUBCUTANEOUS
  Administered 2023-04-20 (×2)
  Administered 2023-04-20 (×3): 5000 [IU] via SUBCUTANEOUS
  Administered 2023-04-21: 03:00:00
  Administered 2023-04-21 (×2): 5000 [IU] via SUBCUTANEOUS

## 2023-04-13 MED ORDER — MYCOPHENOLATE SODIUM 360 MG PO TBEC
360 mg | Freq: Two times a day (BID) | ORAL | 0 refills | Status: AC
Start: 2023-04-13 — End: ?
  Administered 2023-04-14 – 2023-04-15 (×4): 360 mg via ORAL

## 2023-04-13 MED ORDER — INSULIN GLARGINE 100 UNIT/ML (3 ML) SC INJ PEN
20 [IU] | Freq: Every evening | SUBCUTANEOUS | 0 refills | Status: AC
Start: 2023-04-13 — End: ?
  Administered 2023-04-14: 03:00:00 20 [IU] via SUBCUTANEOUS

## 2023-04-13 MED ORDER — TACROLIMUS 1 MG PO CAP
2 mg | Freq: Two times a day (BID) | ORAL | 0 refills | Status: AC
Start: 2023-04-13 — End: ?
  Administered 2023-04-13 – 2023-04-15 (×4): 2 mg via ORAL

## 2023-04-13 MED ORDER — ROSUVASTATIN 20 MG PO TAB
20 mg | Freq: Every day | ORAL | 0 refills | Status: AC
Start: 2023-04-13 — End: ?
  Administered 2023-04-14: 14:00:00 20 mg via ORAL

## 2023-04-13 MED ORDER — INSULIN ASPART 100 UNIT/ML SC FLEXPEN
0-12 [IU] | Freq: Before meals | SUBCUTANEOUS | 0 refills | Status: DC
Start: 2023-04-13 — End: 2023-04-14

## 2023-04-13 MED ORDER — MAGNESIUM SULFATE IN D5W 1 GRAM/100 ML IV PGBK
1 g | INTRAVENOUS | 0 refills | Status: AC
Start: 2023-04-13 — End: ?
  Administered 2023-04-14: 03:00:00 1 g via INTRAVENOUS

## 2023-04-13 MED ORDER — MELATONIN 5 MG PO TAB
5 mg | Freq: Every evening | ORAL | 0 refills | Status: AC | PRN
Start: 2023-04-13 — End: ?
  Administered 2023-04-16: 03:00:00 5 mg via ORAL

## 2023-04-13 MED ORDER — PIPERACILLIN/TAZOBACTAM 4.5 G/100ML NS IVPB (MB+)
4.5 g | Freq: Two times a day (BID) | INTRAVENOUS | 0 refills | Status: AC
Start: 2023-04-13 — End: ?
  Administered 2023-04-14 (×4): 4.5 g via INTRAVENOUS

## 2023-04-13 MED ORDER — SENNOSIDES-DOCUSATE SODIUM 8.6-50 MG PO TAB
1 | Freq: Every day | ORAL | 0 refills | Status: AC | PRN
Start: 2023-04-13 — End: ?

## 2023-04-13 MED ORDER — LACTATED RINGERS IV SOLP
1000 mL | Freq: Once | INTRAVENOUS | 0 refills | Status: CP
Start: 2023-04-13 — End: ?
  Administered 2023-04-14: 03:00:00 1000 mL via INTRAVENOUS

## 2023-04-13 MED ORDER — INSULIN ASPART 100 UNIT/ML SC FLEXPEN
0-24 [IU] | Freq: Before meals | SUBCUTANEOUS | 0 refills | Status: DC
Start: 2023-04-13 — End: 2023-04-14

## 2023-04-13 MED ORDER — MAGNESIUM SULFATE IN D5W 1 GRAM/100 ML IV PGBK
1 g | INTRAVENOUS | 0 refills | Status: AC
Start: 2023-04-13 — End: ?
  Administered 2023-04-14: 09:00:00 1 g via INTRAVENOUS

## 2023-04-13 NOTE — Progress Notes
HPI:  Pt. Hasn't felt well 2-3 mos.  Cr. Checked in June, 3.0.  Last couple days N/V/D.  Today in feeling acutely.  worse.  Decreased urine output     PMH:  DM, pt. 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 Is alert and oriented    Imaging / Procedures:    Labs:  WBC 14.7  Glucose 333  BUN 55.3  Cr. 6.3  K 3.4  ABG:  7.43/25/91/16  Lacatate 3.1  Lipase 22      Meds / Drips: 1L IVF, IV insulin, zofran    Reason for Transfer:  No

## 2023-04-14 ENCOUNTER — Encounter: Admit: 2023-04-14 | Discharge: 2023-04-14 | Payer: MEDICARE

## 2023-04-14 ENCOUNTER — Inpatient Hospital Stay: Admit: 2023-04-14 | Discharge: 2023-04-14 | Payer: MEDICARE

## 2023-04-14 DIAGNOSIS — Z940 Kidney transplant status: Secondary | ICD-10-CM

## 2023-04-14 LAB — LACTIC ACID(LACTATE)
LACTIC ACID: 1 MMOL/L (ref 0.5–2.0)
LACTIC ACID: 1.4 MMOL/L (ref 0.5–2.0)

## 2023-04-14 LAB — COMPREHENSIVE METABOLIC PANEL
ALBUMIN: 4.1 g/dL — ABNORMAL HIGH (ref 3.5–5.0)
ALK PHOSPHATASE: 77 U/L — ABNORMAL LOW (ref 25–110)
ALT: 10 U/L (ref 7–56)
ANION GAP: 13 10*3/uL — ABNORMAL HIGH (ref 3–12)
AST: 11 U/L (ref 7–40)
BLD UREA NITROGEN: 56 mg/dL — ABNORMAL HIGH (ref 7–25)
CALCIUM: 9.2 mg/dL (ref 8.5–10.6)
CHLORIDE: 102 MMOL/L — ABNORMAL LOW (ref 98–110)
CO2: 24 MMOL/L (ref 21–30)
CREATININE: 5.4 mg/dL — ABNORMAL HIGH (ref 0.4–1.00)
EGFR: 9 mL/min — ABNORMAL LOW (ref >60)
GLUCOSE,PANEL: 243 mg/dL — ABNORMAL HIGH (ref 70–100)
POTASSIUM: 3 MMOL/L — ABNORMAL LOW (ref 3.5–5.1)
SODIUM: 139 MMOL/L (ref 137–147)
TOTAL BILIRUBIN: 0.4 mg/dL (ref 0.2–1.3)
TOTAL PROTEIN: 7.6 g/dL (ref 6.0–8.0)

## 2023-04-14 LAB — URINALYSIS DIPSTICK REFLEX TO CULTURE
NITRITE: NEGATIVE
URINE ASCORBIC ACID, UA: NEGATIVE
URINE BILE: NEGATIVE

## 2023-04-14 LAB — URINALYSIS MICROSCOPIC REFLEX TO CULTURE

## 2023-04-14 LAB — CBC AND DIFF
ABSOLUTE BASO COUNT: 0 10*3/uL (ref 0–0.20)
ABSOLUTE EOS COUNT: 0.1 10*3/uL (ref 0–0.45)
ABSOLUTE MONO COUNT: 0.7 10*3/uL (ref 0–0.80)
WBC COUNT: 12 10*3/uL — ABNORMAL HIGH (ref 4.5–11.0)

## 2023-04-14 LAB — BETA HYDROXYBUTYRATE (KETONES): BETA HYDROXYBUTYRATE: 0.2 MMOL/L (ref <0.3)

## 2023-04-14 LAB — POC GLUCOSE: POC GLUCOSE: 252 mg/dL — ABNORMAL HIGH (ref 70–100)

## 2023-04-14 MED ADMIN — SODIUM CHLORIDE 0.9 % IV PGBK (MB+) [95161]: 2 g | INTRAVENOUS | @ 22:00:00 | NDC 00338915930

## 2023-04-14 MED ADMIN — SODIUM CHLORIDE 0.9 % IV SOLP [27838]: 250 mL | @ 04:00:00 | Stop: 2023-04-14 | NDC 00338004902

## 2023-04-14 MED ADMIN — POTASSIUM CHLORIDE 20 MEQ PO TBTQ [35943]: 40 meq | ORAL | @ 22:00:00 | Stop: 2023-04-14 | NDC 00832532510

## 2023-04-14 MED ADMIN — LACTATED RINGERS IV SOLP [4318]: 1000 mL | INTRAVENOUS | @ 14:00:00 | NDC 00338011704

## 2023-04-14 MED ADMIN — CEFEPIME 2 GRAM IJ SOLR [78195]: 2 g | INTRAVENOUS | @ 22:00:00 | NDC 60505614700

## 2023-04-14 MED ADMIN — INSULIN ASPART 100 UNIT/ML SC FLEXPEN [87504]: 8 [IU] | SUBCUTANEOUS | @ 22:00:00 | NDC 00169633910

## 2023-04-15 MED ADMIN — SODIUM CHLORIDE 0.9 % IV PGBK (MB+) [95161]: 2 g | INTRAVENOUS | @ 23:00:00 | NDC 00338915930

## 2023-04-15 MED ADMIN — LOPERAMIDE 2 MG PO CAP [4560]: 2 mg | ORAL | @ 15:00:00 | NDC 60687022911

## 2023-04-15 MED ADMIN — LACTATED RINGERS IV SOLP [4318]: 1000 mL | INTRAVENOUS | @ 06:00:00 | Stop: 2023-04-15 | NDC 00338011704

## 2023-04-15 MED ADMIN — ROSUVASTATIN 10 MG PO TAB [88503]: 10 mg | ORAL | @ 14:00:00 | NDC 00904677961

## 2023-04-15 MED ADMIN — LACTATED RINGERS IV SOLP [4318]: 1000 mL | INTRAVENOUS | @ 18:00:00 | NDC 00338011704

## 2023-04-15 MED ADMIN — TACROLIMUS 1 MG PO CAP [82570]: 1 mg | ORAL | @ 23:00:00 | NDC 70377001511

## 2023-04-15 MED ADMIN — CEFEPIME 2 GRAM IJ SOLR [78195]: 2 g | INTRAVENOUS | @ 23:00:00 | NDC 60505614700

## 2023-04-16 ENCOUNTER — Encounter: Admit: 2023-04-16 | Discharge: 2023-04-16 | Payer: MEDICARE

## 2023-04-16 MED ADMIN — LOPERAMIDE 2 MG PO CAP [4560]: 2 mg | ORAL | @ 07:00:00 | NDC 60687022911

## 2023-04-16 MED ADMIN — DEXTROSE 5% IN WATER IV SOLP [2364]: 1150.0000 mL | INTRAVENOUS | @ 17:00:00 | NDC 00338001704

## 2023-04-16 MED ADMIN — LOPERAMIDE 2 MG PO CAP [4560]: 2 mg | ORAL | @ 03:00:00 | NDC 60687022911

## 2023-04-16 MED ADMIN — TACROLIMUS 1 MG PO CAP [82570]: 1 mg | ORAL | @ 22:00:00 | NDC 70377001511

## 2023-04-16 MED ADMIN — SODIUM BICARBONATE 1 MEQ/ML (8.4 %) IV SOLN [7309]: 1150.0000 mL | INTRAVENOUS | @ 17:00:00 | NDC 81665020001

## 2023-04-16 MED ADMIN — MYCOPHENOLATE SODIUM 180 MG PO TBEC [89944]: 180 mg | ORAL | @ 14:00:00 | NDC 67877042612

## 2023-04-16 MED ADMIN — ACETAMINOPHEN 325 MG PO TAB [101]: 650 mg | ORAL | @ 02:00:00 | NDC 00904677361

## 2023-04-16 MED ADMIN — MYCOPHENOLATE SODIUM 180 MG PO TBEC [89944]: 180 mg | ORAL | @ 03:00:00 | NDC 67877042612

## 2023-04-16 MED ADMIN — TACROLIMUS 1 MG PO CAP [82570]: 1 mg | ORAL | @ 10:00:00 | NDC 70377001511

## 2023-04-16 MED ADMIN — ACETAMINOPHEN 325 MG PO TAB [101]: 650 mg | ORAL | @ 08:00:00 | NDC 00904677361

## 2023-04-16 MED ADMIN — LACTATED RINGERS IV SOLP [4318]: 1000 mL | INTRAVENOUS | @ 03:00:00 | NDC 00338011704

## 2023-04-16 MED ADMIN — ROSUVASTATIN 10 MG PO TAB [88503]: 10 mg | ORAL | @ 14:00:00 | NDC 00904677961

## 2023-04-16 MED ADMIN — LACTATED RINGERS IV SOLP [4318]: 1000 mL | INTRAVENOUS | @ 11:00:00 | Stop: 2023-04-16 | NDC 00338011704

## 2023-04-17 ENCOUNTER — Encounter: Admit: 2023-04-17 | Discharge: 2023-04-17 | Payer: MEDICARE

## 2023-04-17 ENCOUNTER — Inpatient Hospital Stay: Admit: 2023-04-17 | Discharge: 2023-04-17 | Payer: MEDICARE

## 2023-04-17 ENCOUNTER — Ambulatory Visit: Admit: 2023-04-17 | Discharge: 2023-04-17 | Payer: MEDICARE

## 2023-04-17 DIAGNOSIS — R897 Abnormal histological findings in specimens from other organs, systems and tissues: Secondary | ICD-10-CM

## 2023-04-17 DIAGNOSIS — N186 End stage renal disease: Secondary | ICD-10-CM

## 2023-04-17 DIAGNOSIS — E119 Type 2 diabetes mellitus without complications: Secondary | ICD-10-CM

## 2023-04-17 DIAGNOSIS — D649 Anemia, unspecified: Secondary | ICD-10-CM

## 2023-04-17 DIAGNOSIS — R197 Diarrhea, unspecified: Secondary | ICD-10-CM

## 2023-04-17 DIAGNOSIS — K259 Gastric ulcer, unspecified as acute or chronic, without hemorrhage or perforation: Secondary | ICD-10-CM

## 2023-04-17 DIAGNOSIS — N19 Unspecified kidney failure: Secondary | ICD-10-CM

## 2023-04-17 DIAGNOSIS — I2510 Atherosclerotic heart disease of native coronary artery without angina pectoris: Secondary | ICD-10-CM

## 2023-04-17 DIAGNOSIS — I10 Essential (primary) hypertension: Secondary | ICD-10-CM

## 2023-04-17 DIAGNOSIS — K2210 Ulcer of esophagus without bleeding: Secondary | ICD-10-CM

## 2023-04-17 DIAGNOSIS — D472 Monoclonal gammopathy: Secondary | ICD-10-CM

## 2023-04-17 DIAGNOSIS — K633 Ulcer of intestine: Secondary | ICD-10-CM

## 2023-04-17 DIAGNOSIS — R809 Proteinuria, unspecified: Secondary | ICD-10-CM

## 2023-04-17 MED ORDER — PROPOFOL INJ 10 MG/ML IV VIAL
INTRAVENOUS | 0 refills | Status: DC
Start: 2023-04-17 — End: 2023-04-17

## 2023-04-17 MED ORDER — SODIUM CHLORIDE 0.9 % IV SOLP (OR) 500ML
INTRAVENOUS | 0 refills | Status: DC
Start: 2023-04-17 — End: 2023-04-17

## 2023-04-17 MED ORDER — LIDOCAINE (PF) 20 MG/ML (2 %) IJ SOLN
INTRAVENOUS | 0 refills | Status: DC
Start: 2023-04-17 — End: 2023-04-17

## 2023-04-17 MED ORDER — PROPOFOL 10 MG/ML IV EMUL 100 ML (INFUSION)(AM)(OR)
INTRAVENOUS | 0 refills | Status: DC
Start: 2023-04-17 — End: 2023-04-17

## 2023-04-17 MED ADMIN — MYCOPHENOLATE SODIUM 180 MG PO TBEC [89944]: 180 mg | ORAL | @ 16:00:00 | NDC 67877042612

## 2023-04-17 MED ADMIN — SODIUM BICARBONATE 1 MEQ/ML (8.4 %) IV SOLN [7309]: 1150.0000 mL | INTRAVENOUS | @ 08:00:00 | NDC 81665020001

## 2023-04-17 MED ADMIN — MYCOPHENOLATE SODIUM 180 MG PO TBEC [89944]: 180 mg | ORAL | @ 04:00:00 | NDC 67877042612

## 2023-04-17 MED ADMIN — POTASSIUM CHLORIDE 20 MEQ PO TBTQ [35943]: 60 meq | ORAL | @ 16:00:00 | Stop: 2023-04-17 | NDC 00832532510

## 2023-04-17 MED ADMIN — TACROLIMUS 1 MG PO CAP [82570]: 2 mg | ORAL | @ 23:00:00 | NDC 70377001511

## 2023-04-17 MED ADMIN — TACROLIMUS 1 MG PO CAP [82570]: 1 mg | ORAL | @ 11:00:00 | Stop: 2023-04-17 | NDC 70377001511

## 2023-04-17 MED ADMIN — PEG-ELECTROLYTE SOLN 420 GRAM PO SOLR [79142]: 2 L | ORAL | @ 04:00:00 | Stop: 2023-04-17 | NDC 10572030201

## 2023-04-17 MED ADMIN — PANTOPRAZOLE 40 MG PO TBEC [80436]: 40 mg | ORAL | @ 20:00:00 | NDC 00904647461

## 2023-04-17 MED ADMIN — DEXTROSE 5% IN WATER IV SOLP [2364]: 1150.0000 mL | INTRAVENOUS | @ 08:00:00 | NDC 00338001704

## 2023-04-17 NOTE — Anesthesia Post-Procedure Evaluation
Post-Anesthesia Evaluation    Name: Jacqueline Shields      MRN: [id number]     DOB: Feb 15, 1966     Age: 57 y.o.     Sex: female   __________________________________________________________________________     Procedure Information       Anesthesia Start Date/Time: 04/17/23 0809    Procedures:       ESOPHAGOGASTRODUODENOSCOPY WITH BIOPSY - FLEXIBLE      COLONOSCOPY WITH BIOPSY - FLEXIBLE    Location: ENDO 3 / ENDO/GI    Surgeons: Remigio Eisenmenger, MD            Post-Anesthesia Vitals  BP: 112/71 (09/16 0915)  Temp: 36.6 ?C (97.8 ?F) (09/16 3244)  Pulse: 66 (09/16 0915)  Respirations: 15 PER MINUTE (09/16 0915)  SpO2: 100 % (09/16 0915)  O2 Device: None (Room air) (09/16 0900)   Vitals Value Taken Time   BP 112/71 04/17/23 0915   Temp     Pulse 66 04/17/23 0915   Respirations 15 PER MINUTE 04/17/23 0915   SpO2 100 % 04/17/23 0915   O2 Device None (Room air) 04/17/23 0900   ABP     ART BP           Post Anesthesia Evaluation Note    Evaluation location: other  Patient participation: recovered; patient participated in evaluation  Level of consciousness: sleepy but conscious    Pain score: 0  Pain management: adequate    Hydration: normovolemia  Temperature: 36.0?C - 38.4?C  Airway patency: adequate    Perioperative Events      Postoperative Status  Cardiovascular status: hemodynamically stable  Respiratory status: spontaneous ventilation        Perioperative Events  There were no known complications for this encounter.

## 2023-04-18 ENCOUNTER — Inpatient Hospital Stay: Admit: 2023-04-18 | Discharge: 2023-04-18 | Payer: MEDICARE

## 2023-04-18 ENCOUNTER — Encounter: Admit: 2023-04-18 | Discharge: 2023-04-18 | Payer: MEDICARE

## 2023-04-18 MED ADMIN — MYCOPHENOLATE SODIUM 180 MG PO TBEC [89944]: 180 mg | ORAL | @ 02:00:00 | NDC 67877042612

## 2023-04-18 MED ADMIN — SODIUM BICARBONATE 1 MEQ/ML (8.4 %) IV SOLN [7309]: 1150.0000 mL | INTRAVENOUS | @ 06:00:00 | Stop: 2023-04-18 | NDC 81665020001

## 2023-04-18 MED ADMIN — MAGNESIUM SULFATE IN D5W 1 GRAM/100 ML IV PGBK [166578]: 1 g | INTRAVENOUS | @ 14:00:00 | Stop: 2023-04-18 | NDC 00264440054

## 2023-04-18 MED ADMIN — PANTOPRAZOLE 40 MG PO TBEC [80436]: 40 mg | ORAL | @ 02:00:00 | NDC 00904647461

## 2023-04-18 MED ADMIN — TACROLIMUS 1 MG PO CAP [82570]: 2 mg | ORAL | @ 23:00:00 | NDC 70377001511

## 2023-04-18 MED ADMIN — MAGNESIUM SULFATE IN D5W 1 GRAM/100 ML IV PGBK [166578]: 1 g | INTRAVENOUS | @ 18:00:00 | Stop: 2023-04-18 | NDC 00264440054

## 2023-04-18 MED ADMIN — PANTOPRAZOLE 40 MG PO TBEC [80436]: 40 mg | ORAL | @ 16:00:00 | NDC 00904647461

## 2023-04-18 MED ADMIN — MYCOPHENOLATE SODIUM 180 MG PO TBEC [89944]: 180 mg | ORAL | @ 13:00:00 | NDC 67877042612

## 2023-04-18 MED ADMIN — DEXTROSE 5% IN WATER IV SOLP [2364]: 1150.0000 mL | INTRAVENOUS | @ 06:00:00 | Stop: 2023-04-18 | NDC 00338001704

## 2023-04-18 MED ADMIN — TACROLIMUS 1 MG PO CAP [82570]: 1 mg | ORAL | @ 11:00:00 | Stop: 2023-04-18 | NDC 70377001511

## 2023-04-18 MED ADMIN — ROSUVASTATIN 10 MG PO TAB [88503]: 10 mg | ORAL | @ 13:00:00 | NDC 00904677961

## 2023-04-19 ENCOUNTER — Inpatient Hospital Stay: Admit: 2023-04-19 | Discharge: 2023-04-19 | Payer: MEDICARE

## 2023-04-19 ENCOUNTER — Encounter: Admit: 2023-04-19 | Discharge: 2023-04-19 | Payer: MEDICARE

## 2023-04-19 DIAGNOSIS — D649 Anemia, unspecified: Secondary | ICD-10-CM

## 2023-04-19 DIAGNOSIS — N186 End stage renal disease: Secondary | ICD-10-CM

## 2023-04-19 DIAGNOSIS — D472 Monoclonal gammopathy: Secondary | ICD-10-CM

## 2023-04-19 DIAGNOSIS — E119 Type 2 diabetes mellitus without complications: Secondary | ICD-10-CM

## 2023-04-19 DIAGNOSIS — I2510 Atherosclerotic heart disease of native coronary artery without angina pectoris: Secondary | ICD-10-CM

## 2023-04-19 DIAGNOSIS — I10 Essential (primary) hypertension: Secondary | ICD-10-CM

## 2023-04-19 DIAGNOSIS — R809 Proteinuria, unspecified: Secondary | ICD-10-CM

## 2023-04-19 DIAGNOSIS — R897 Abnormal histological findings in specimens from other organs, systems and tissues: Secondary | ICD-10-CM

## 2023-04-19 DIAGNOSIS — N19 Unspecified kidney failure: Secondary | ICD-10-CM

## 2023-04-19 MED ADMIN — MYCOPHENOLATE SODIUM 180 MG PO TBEC [89944]: 180 mg | ORAL | @ 02:00:00 | NDC 67877042612

## 2023-04-19 MED ADMIN — CEFTRIAXONE INJ 1GM IVP [210253]: 1 g | INTRAVENOUS | @ 22:00:00 | Stop: 2023-04-19 | NDC 60505614800

## 2023-04-19 MED ADMIN — MYCOPHENOLATE SODIUM 180 MG PO TBEC [89944]: 180 mg | ORAL | @ 15:00:00 | Stop: 2023-04-19 | NDC 67877042612

## 2023-04-19 MED ADMIN — MIDAZOLAM 1 MG/ML IJ SOLN [10607]: 1 mg | INTRAVENOUS | @ 22:00:00 | Stop: 2023-04-19 | NDC 23155060031

## 2023-04-19 MED ADMIN — ROSUVASTATIN 10 MG PO TAB [88503]: 10 mg | ORAL | @ 15:00:00 | NDC 00904677961

## 2023-04-19 MED ADMIN — PANTOPRAZOLE 40 MG PO TBEC [80436]: 40 mg | ORAL | @ 02:00:00 | NDC 00904647461

## 2023-04-19 MED ADMIN — TACROLIMUS 1 MG PO CAP [82570]: 2 mg | ORAL | @ 10:00:00 | NDC 70377001511

## 2023-04-19 MED ADMIN — IOHEXOL 300 MG IODINE/ML IV SOLN [79156]: 10 mL | @ 23:00:00 | Stop: 2023-04-19 | NDC 00407141310

## 2023-04-19 MED ADMIN — PANTOPRAZOLE 40 MG PO TBEC [80436]: 40 mg | ORAL | @ 15:00:00 | NDC 00904647461

## 2023-04-20 ENCOUNTER — Encounter: Admit: 2023-04-20 | Discharge: 2023-04-20 | Payer: MEDICARE

## 2023-04-20 MED ADMIN — TACROLIMUS 1 MG PO CAP [82570]: ORAL | @ 11:00:00 | NDC 70377001511

## 2023-04-20 MED ADMIN — SODIUM CHLORIDE 0.9 % IJ SYRG [86437]: @ 14:00:00 | NDC 08290306546

## 2023-04-20 MED ADMIN — SODIUM CHLORIDE 0.9 % IJ SYRG [86437]: 10 mL | @ 03:00:00 | NDC 08290306546

## 2023-04-20 MED ADMIN — VALGANCICLOVIR 450 MG PO TAB [77195]: 450 mg | ORAL | @ 03:00:00 | NDC 31722083260

## 2023-04-20 MED ADMIN — TACROLIMUS 1 MG PO CAP [82570]: 2 mg | ORAL | @ 03:00:00 | NDC 70377001511

## 2023-04-20 MED ADMIN — PANTOPRAZOLE 40 MG PO TBEC [80436]: 40 mg | ORAL | @ 15:00:00 | NDC 00904647461

## 2023-04-20 MED ADMIN — TACROLIMUS 1 MG PO CAP [82570]: @ 23:00:00 | NDC 70377001511

## 2023-04-20 MED ADMIN — TACROLIMUS 1 MG PO CAP [82570]: 2 mg | ORAL | @ 22:00:00 | NDC 70377001511

## 2023-04-20 MED ADMIN — PANTOPRAZOLE 40 MG PO TBEC [80436]: 40 mg | ORAL | @ 03:00:00 | NDC 00904647461

## 2023-04-20 MED ADMIN — TACROLIMUS 1 MG PO CAP [82570]: 2 mg | ORAL | @ 11:00:00 | NDC 70377001511

## 2023-04-20 MED ADMIN — SODIUM CHLORIDE 0.9 % IJ SYRG [86437]: 10 mL | @ 13:00:00 | NDC 08290306546

## 2023-04-20 MED ADMIN — ROSUVASTATIN 10 MG PO TAB [88503]: 10 mg | ORAL | @ 13:00:00 | NDC 00904677961

## 2023-04-20 MED ADMIN — PANTOPRAZOLE 40 MG PO TBEC [80436]: @ 16:00:00 | NDC 00904647461

## 2023-04-20 MED ADMIN — ROSUVASTATIN 10 MG PO TAB [88503]: @ 14:00:00 | NDC 00904677961

## 2023-04-21 ENCOUNTER — Encounter: Admit: 2023-04-21 | Discharge: 2023-04-21 | Payer: MEDICARE

## 2023-04-21 MED ADMIN — SODIUM CHLORIDE 0.9 % IJ SYRG [86437]: 10 mL | @ 03:00:00 | NDC 08290306546

## 2023-04-21 MED ADMIN — PANTOPRAZOLE 40 MG PO TBEC [80436]: 40 mg | ORAL | @ 03:00:00 | NDC 00904647461

## 2023-04-21 MED ADMIN — TACROLIMUS 1 MG PO CAP [82570]: 2 mg | ORAL | @ 11:00:00 | Stop: 2023-04-21 | NDC 70377001511

## 2023-04-21 MED ADMIN — ROSUVASTATIN 10 MG PO TAB [88503]: 10 mg | ORAL | @ 14:00:00 | Stop: 2023-04-21 | NDC 00904677961

## 2023-04-21 MED ADMIN — PANTOPRAZOLE 40 MG PO TBEC [80436]: @ 02:00:00 | NDC 00904647461

## 2023-04-21 MED ADMIN — PANTOPRAZOLE 40 MG PO TBEC [80436]: 40 mg | ORAL | @ 18:00:00 | Stop: 2023-04-21 | NDC 00904647461

## 2023-04-21 MED ADMIN — SODIUM CHLORIDE 0.9 % IJ SYRG [86437]: @ 02:00:00 | NDC 08290306546

## 2023-04-21 MED ADMIN — SODIUM CHLORIDE 0.9 % IJ SYRG [86437]: 10 mL | @ 14:00:00 | Stop: 2023-04-21 | NDC 08290306546

## 2023-04-21 MED FILL — INSULIN ASPART 100 UNIT/ML SC FLEXPEN: 100 unit/mL (3 mL) | SUBCUTANEOUS | 100 days supply | Qty: 30 | Fill #1 | Status: CP

## 2023-04-21 MED FILL — SODIUM CHLORIDE 0.9 % IJ SYRG: 5 days supply | Qty: 100 | Fill #1 | Status: CP

## 2023-04-21 MED FILL — LANTUS SOLOSTAR U-100 INSULIN 100 UNIT/ML (3 ML) SC INPN: 100 unit/mL (3 mL) | SUBCUTANEOUS | 100 days supply | Qty: 30 | Fill #1 | Status: CP

## 2023-04-21 MED FILL — PANTOPRAZOLE 40 MG PO TBEC: 40 mg | ORAL | 90 days supply | Qty: 180 | Fill #1 | Status: CP

## 2023-04-21 MED FILL — VALGANCICLOVIR 450 MG PO TAB: 450 mg | ORAL | 28 days supply | Qty: 14 | Fill #1 | Status: CP

## 2023-04-21 MED FILL — PEN NEEDLE, DIABETIC 32 GAUGE X 5/32" MISC NDLE: 32 gauge x 5/" | 75 days supply | Qty: 300 | Fill #1 | Status: CP

## 2023-04-25 ENCOUNTER — Encounter: Admit: 2023-04-25 | Discharge: 2023-04-25 | Payer: MEDICARE

## 2023-04-25 ENCOUNTER — Ambulatory Visit: Admit: 2023-04-25 | Discharge: 2023-04-25 | Payer: MEDICARE

## 2023-04-25 ENCOUNTER — Ambulatory Visit: Admit: 2023-04-25 | Discharge: 2023-04-25 | Payer: BC Managed Care – PPO

## 2023-04-25 DIAGNOSIS — D472 Monoclonal gammopathy: Secondary | ICD-10-CM

## 2023-04-25 DIAGNOSIS — E559 Vitamin D deficiency, unspecified: Secondary | ICD-10-CM

## 2023-04-25 DIAGNOSIS — R897 Abnormal histological findings in specimens from other organs, systems and tissues: Secondary | ICD-10-CM

## 2023-04-25 DIAGNOSIS — E7849 Other hyperlipidemia: Secondary | ICD-10-CM

## 2023-04-25 DIAGNOSIS — I2510 Atherosclerotic heart disease of native coronary artery without angina pectoris: Secondary | ICD-10-CM

## 2023-04-25 DIAGNOSIS — D849 Immunodeficiency, unspecified: Secondary | ICD-10-CM

## 2023-04-25 DIAGNOSIS — Z87898 Personal history of other specified conditions: Secondary | ICD-10-CM

## 2023-04-25 DIAGNOSIS — R809 Proteinuria, unspecified: Secondary | ICD-10-CM

## 2023-04-25 DIAGNOSIS — E213 Hyperparathyroidism, unspecified: Secondary | ICD-10-CM

## 2023-04-25 DIAGNOSIS — R808 Other proteinuria: Secondary | ICD-10-CM

## 2023-04-25 DIAGNOSIS — Z940 Kidney transplant status: Secondary | ICD-10-CM

## 2023-04-25 DIAGNOSIS — Z79899 Other long term (current) drug therapy: Secondary | ICD-10-CM

## 2023-04-25 DIAGNOSIS — D649 Anemia, unspecified: Secondary | ICD-10-CM

## 2023-04-25 DIAGNOSIS — B259 Cytomegaloviral disease, unspecified: Secondary | ICD-10-CM

## 2023-04-25 DIAGNOSIS — N19 Unspecified kidney failure: Secondary | ICD-10-CM

## 2023-04-25 DIAGNOSIS — E119 Type 2 diabetes mellitus without complications: Secondary | ICD-10-CM

## 2023-04-25 DIAGNOSIS — N186 End stage renal disease: Secondary | ICD-10-CM

## 2023-04-25 DIAGNOSIS — I10 Essential (primary) hypertension: Secondary | ICD-10-CM

## 2023-04-25 LAB — COMPREHENSIVE METABOLIC PANEL
POTASSIUM: 3.9 MMOL/L (ref <100)
SODIUM: 144 MMOL/L (ref >40)

## 2023-04-25 LAB — PARATHYROID HORMONE: PTH HORMONE: 151 pg/mL — ABNORMAL HIGH (ref 10–65)

## 2023-04-25 LAB — CBC AND DIFF
ABSOLUTE BASO COUNT: 0 10*3/uL (ref 0–0.20)
ABSOLUTE NEUTROPHIL: 5.9 10*3/uL (ref 1.8–7.0)
BASOPHILS %: 1 % (ref 0–2)
EOSINOPHILS %: 2 % — ABNORMAL LOW (ref >60)
HEMATOCRIT: 34 % — ABNORMAL LOW (ref 36–45)
LYMPHOCYTES %: 9 % — ABNORMAL LOW (ref 24–44)
MCH: 28 pg (ref 26–34)
MCHC: 32 g/dL (ref 32.0–36.0)
MCV: 88 FL (ref 80–100)
MONOCYTES %: 4 % (ref 4–12)
MPV: 9.4 FL (ref 7–11)
NEUTROPHILS %: 84 % — ABNORMAL HIGH (ref 41–77)
PLATELET COUNT: 236 10*3/uL (ref 150–400)
RBC COUNT: 3.8 M/UL — ABNORMAL LOW (ref 4.0–5.0)
RDW: 15 % — ABNORMAL HIGH (ref 11–15)
WBC COUNT: 7.2 10*3/uL (ref 4.5–11.0)

## 2023-04-25 LAB — URINALYSIS DIPSTICK REFLEX TO CULTURE
NITRITE: NEGATIVE
URINE ASCORBIC ACID, UA: NEGATIVE
URINE BILE: NEGATIVE
URINE KETONE: NEGATIVE
URINE PH: 7 (ref 5.0–8.0)
URINE SPEC GRAVITY: 1 (ref 1.005–1.030)

## 2023-04-25 LAB — FK506 TACROLIMUS: TACROLIMUS: 7.3 ng/mL (ref 5.0–15.0)

## 2023-04-25 LAB — LIPID PROFILE
CHOLESTEROL: 124 mg/dL (ref <200)
TRIGLYCERIDES: 212 mg/dL — ABNORMAL HIGH (ref <150)

## 2023-04-25 LAB — MAGNESIUM: MAGNESIUM: 1.7 mg/dL (ref 1.6–2.6)

## 2023-04-25 LAB — URINALYSIS MICROSCOPIC REFLEX TO CULTURE

## 2023-04-25 LAB — URIC ACID: URIC ACID: 5 mg/dL (ref 2.0–7.0)

## 2023-04-25 LAB — PHOSPHORUS: PHOSPHORUS: 3.3 mg/dL (ref 2.0–4.5)

## 2023-04-25 LAB — 25-OH VITAMIN D (D2 + D3): VITAMIN D (25-OH) TOTAL: 35 ng/mL — ABNORMAL LOW (ref 30–80)

## 2023-04-25 NOTE — Progress Notes
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  She was recently admitted in the hospital for diarrhea-is being treated for CMV colitis, also underwent a PCN placement for hydronephrosis.  Feels better now, AKI was downtrending, labs pending from today.  She was discharged feeling fine.  Comes in today for follow-up.  Underwent lab this morning, Myfortic has been stopped, Valcyte started.  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  PCNU output around 2.5 L, not passing urine normally.  Has a urology appointment coming up.    REVIEW OF SYSTEMS: Comprehensive 14-point ROS reviewed  Positives noted in HPI otherwise negative.    Past History:  Past Medical History:   Diagnosis Date    Abnormal biopsy of kidney     Anemia     Coronary artery disease due to lipid rich plaque 07/03/2020    Diabetes mellitus (HCC)     ESRD (end stage renal disease) (HCC)     Hypertension     Kidney failure     MGUS (monoclonal gammopathy of unknown significance)     Proteinuria        Surgical History:   Procedure Laterality Date    ANGIOGRAPHY CORONARY ARTERY WITH LEFT HEART CATHETERIZATION N/A 07/03/2020    Performed by Harley Alto, MD at [facility] CATH LAB POSSIBLE PERCUTANEOUS CORONARY STENT PLACEMENT WITH ANGIOPLASTY N/A 07/03/2020    Performed by Harley Alto, MD at [facility] CATH LAB    ALLOTRANSPLANTATION KIDNEY FROM NON LIVING DONOR WITHOUT RECIPIENT NEPHRECTOMY N/A 07/15/2020    Performed by Nicholos Johns, MD at [facility] OR    REMOVAL TUNNELED INTRAPERITONEAL CATHETER Bilateral 08/24/2020    Performed by Nicholos Johns, MD at [facility] OR    CYSTOURETHROSCOPY WITH REMOVAL FOREIGN BODY/ CALCULUS/ STENT FROM URETHRA/ BLADDER - SIMPLE Right 08/24/2020    Performed by Marylen Ponto, MD at [facility] OR    ESOPHAGOGASTRODUODENOSCOPY WITH BIOPSY - FLEXIBLE N/A 04/17/2023    Performed by Remigio Eisenmenger, MD at [facility] ENDO    COLONOSCOPY WITH BIOPSY - FLEXIBLE N/A 04/17/2023    Performed by Remigio Eisenmenger, MD at [facility] ENDO    CATHETER IMPLANT/REVISION      PD cath    HX HYSTERECTOMY      HX LITHOTRIPSY         Social History     Socioeconomic History    Marital status: Married     Spouse name: Fredrik Cove    Number of children: 2   Tobacco Use    Smoking status: Never    Smokeless tobacco: Never   Vaping Use  Vaping status: Never Used   Substance and Sexual Activity    Alcohol use: Not Currently    Drug use: Never   Social History Narrative    Lives in [place], [place]. Works at [employer], puts labels on bottles.       Family History   Problem Relation Name Age of Onset    Diabetes Mother      Hypertension Mother      Diabetes Father      Hypertension Father      Cancer-Hematologic Father      Diabetes Sister      Migraines Sister      Cancer Sister          Endometrial       No Known Allergies    Current Medications:    Current Outpatient Medications:     aspirin EC (ASPIR-LOW) 81 mg tablet, Take one tablet by mouth daily. Take with food., Disp: 90 tablet, Rfl: 3    blood sugar diagnostic (ONETOUCH VERIO TEST STRIPS) test strip, Use one strip as directed before meals and at bedtime. ICD-10: Type II Diabetes with unspecified complications; with long term insulin use E11.8, Z79.4  Indications: type 2 diabetes mellitus, Disp: 300 strip, Rfl: 3    CALCIUM PO, Take 1 tablet by mouth daily., Disp: , Rfl:     CHOLEcalciferoL (vitamin D3) 50 mcg (2,000 unit) capsule, TAKE 1 CAPSULE BY MOUTH EVERY DAY, Disp: 90 capsule, Rfl: 3    cyanocobalamin (vitamin B-12) (VITAMIN B-12 PO), Take 2 Gummy by mouth daily., Disp: , Rfl:     d-mannose 500 mg cap, Take 1 capsule by mouth daily., Disp: , Rfl:     ferrous sulfate (FEOSOL) 325 mg (65 mg iron) tablet, Take one tablet by mouth at bedtime daily. Take on an empty stomach at least 1 hour before or 2 hours after food., Disp: 30 tablet, Rfl: 3    glucose (DEX4 GLUCOSE) 4 gram chewable tablet, Chew four tablets by mouth as Needed. Indications: low blood sugar, Disp: 50 tablet, Rfl: 0    insulin aspart (U-100) (NOVOLOG FLEXPEN U-100 INSULIN) 100 unit/mL (3 mL) PEN, Inject ten Units under the skin three times daily with meals. Indications: type 2 diabetes mellitus, Disp: 45 mL, Rfl: 1    insulin glargine (LANTUS SOLOSTAR U-100 INSULIN) 100 unit/mL (3 mL) subcutaneous PEN, Inject thirty Units under the skin at bedtime daily. Indications: type 2 diabetes mellitus, Disp: 45 mL, Rfl: 0    lancets 33 gauge (ONETOUCH DELICA 33 GUAGE) 33 gauge, Use one each as directed four times daily. ICD-10: Type II Diabetes with unspecified complications; with long term insulin use E11.8, Z79.4, Disp: 300 each, Rfl: 3    pantoprazole DR (PROTONIX) 40 mg tablet, Take one tablet by mouth twice daily for 90 days. Indications: a stomach ulcer, Disp: 180 tablet, Rfl: 0    pen needle, diabetic (BD ULTRA-FINE NANO PEN NEEDLE) 32 gauge x 5/32 pen needle, Use one each as directed as Needed. Use with insulin injections., Disp: 300 each, Rfl: 3    rosuvastatin (CRESTOR) 20 mg tablet, Take one tablet by mouth daily., Disp: 90 tablet, Rfl: 1    sodium chloride 0.9 % (flush) (NORMAL SALINE FLUSH) syringe, 10 mL by Intra-catheter route twice daily. Indications: nephrostomy tube, Disp: 125 mL, Rfl: 11    tacrolimus (PROGRAF) 1 mg capsule, Take two capsules by mouth twice daily., Disp: 720 capsule, Rfl: 3    valGANciclovir (VALCYTE) 450 mg tablet, Take one  tablet by mouth every 48 hours for 28 days. Indications: CMV colitis, Disp: 14 tablet, Rfl: 0    Physical Exam:  Vitals:    04/25/23 1150   BP: 108/78   BP Source: Arm, Right Upper   Pulse: 85   Temp: 36.5 ?C (97.7 ?F)   Resp: 16   SpO2: 100%   PainSc: Zero   Weight: 97.5 kg (215 lb)   Height: 172.7 cm (5' 8)   Body mass index is 32.69 kg/m?Marland Kitchen     General: In NAD; A&Ox3  Skin: Warm, dry, no signs of rash   Neck: Supple   CV: Regular, regular rate, normal S1/S2, no murmurs  Lungs: CTA bilaterally in posterior fields  Abd: Grossly normal without rebound, guarding, masses, or bruits   Transplant: Right lower quadrant, healed,  Foley placed w clean urine, PCNU -on 2.5 L of urine every day,  Ext: No edema  Neuro: No focal deficits   Psych: Affect appropriat    Laboratory studies:     CMP:      Latest Ref Rng & Units 04/21/2023     5:36 AM 04/20/2023     5:09 AM 04/19/2023     5:21 AM 04/18/2023     5:48 AM 04/17/2023     5:54 AM   CMP   Sodium 137 - 147 MMOL/L 142  143  144  143  143    Potassium 3.5 - 5.1 MMOL/L 4.1  4.0  3.9  3.9  3.4    Chloride 98 - 110 MMOL/L 110  110  110  110  112    CO2 21 - 30 MMOL/L 24  25  25  25  21     Anion Gap 3 - 12 8  8  9  8  10     Blood Urea Nitrogen 7 - 25 MG/DL 35  32  32  34  38    Creatinine 0.4 - 1.00 MG/DL 4.54  0.98  1.19  1.47  4.30    Glucose 70 - 100 MG/DL 829  562  130  865  784    Calcium 8.5 - 10.6 MG/DL 7.8  7.9  7.4  7.3  7.4    Total Protein 6.0 - 8.0 G/DL 5.7  5.7  5.5  5.3  5.5    Albumin 3.5 - 5.0 G/DL 3.1  3.0  3.0  2.8  3.0    Alk Phosphatase 25 - 110 U/L 40  43  43  44  42    ALT (SGPT) 7 - 56 U/L 14  13  12  10  6     Total Bilirubin 0.2 - 1.3 MG/DL 0.2  0.2  0.2  0.2  0.2      Hemoglobin A1C (%)   Date Value   04/13/2023 11.1 (H)   10/30/2021 11.6 (H)   05/29/2021 10.8 (H)     PTH Hormone (PG/ML)   Date Value   10/30/2021 195.9 (H)   10/14/2020 96.1 (H)     No results found for: LIPASE  No results found for: AMY  BK Virus Plasma Quant (no units)   Date Value   04/14/2023 BK Virus Not Detected   10/30/2021     NOT DETECTED  Reference range: NOT DETECTED  Unit: IU/mL  .  Assay Range: 33 IU/mL to 3.30E+08 IU/mL  .  One IU is equal to 0.33 copies of BKV.  .  The limit of quantitation (LOQ) is 33 IU/mL. BK  virus DNA detected  below the LOQ  will be reported as Detected:<33 IU/mL.  .  This test was developed and its performance characteristics  determined by  [facility]. It has not been cleared or approved by the U.S.  Food and Drug  Administration. Results should be used in conjunction with clinical  findings,  and should not form the sole basis for a diagnosis or treatment  decision.  ____________________________________________________________  Testing Performed At:  [facility]  [address]  [place], [place] [address]  Laboratory Director: Lind Guest Ph.D., BCLD (ABB)  CLIA#: [id number]  Phone: [phone]     10/02/2021     NOT DETECTED  Reference range: NOT DETECTED  Unit: IU/mL  TESTING PERFORMED AT LOW VOLUME ON PLASMA SPECIMEN FOR BKV qPCR, MAY  AFFECT  RESULTS.  Marland Kitchen  Assay Range: 33 IU/mL to 3.30E+08 IU/mL  .  One IU is equal to 0.33 copies of BKV.  .  The limit of quantitation (LOQ) is 33 IU/mL. BK virus DNA detected  below the LOQ  will be reported as Detected:<33 IU/mL.  .  This test was developed and its performance characteristics  determined by  [facility]. It has not been cleared or approved by the U.S.  Food and Drug  Administration. Results should be used in conjunction with clinical  findings,  and should not form the sole basis for a diagnosis or treatment  decision.  ____________________________________________________________  Testing Performed At:  [facility]  [address]  [place], [place] [address]  Laboratory Director: Lind Guest Ph.D., BCLD (ABB)  CLIA#: [id number]  Phone: [phone] 09/04/2021     NOT DETECTED  Reference range: NOT DETECTED  Unit: IU/mL  .  Assay Range: 33 IU/mL to 3.30E+08 IU/mL  .  One IU is equal to 0.33 copies of BKV.  .  The limit of quantitation (LOQ) is 33 IU/mL. BK virus DNA detected  below the LOQ  will be reported as Detected:<33 IU/mL.  .  This test was developed and its performance characteristics  determined by  [facility]. It has not been cleared or approved by the U.S.  Food and Drug  Administration. Results should be used in conjunction with clinical  findings,  and should not form the sole basis for a diagnosis or treatment  decision.  ____________________________________________________________  Testing Performed At:  [facility]  [address]  [place], [place] [address]  Laboratory Director: Lind Guest Ph.D., BCLD (ABB)  CLIA#: [id number]  Phone: [phone]     08/03/2021     NOT DETECTED  Reference range: NOT DETECTED  Unit: IU/mL  .  Assay Range: 33 IU/mL to 3.30E+08 IU/mL  .  One IU is equal to 0.33 copies of BKV.  .  The limit of quantitation (LOQ) is 33 IU/mL. BK virus DNA detected  below the LOQ  will be reported as Detected:<33 IU/mL.  .  This test was developed and its performance characteristics  determined by  [facility]. It has not been cleared or approved by the U.S.  Food and Drug  Administration. Results should be used in conjunction with clinical  findings,  and should not form the sole basis for a diagnosis or treatment  decision.  ____________________________________________________________  Testing Performed At:  [facility]  [address]  [place], [place] [address]  Laboratory Director: Lind Guest Ph.D., BCLD (ABB)  CLIA#: [id number]  Phone: [phone]       CMV DNA Quant PCR ([IU]/mL)  Date Value   04/14/2023 CMV DNA NOT DETECTED   12/04/2021 CMV DNA NOT DETECTED   10/30/2021 CMV DNA NOT DETECTED   10/02/2021 CMV DNA NOT DETECTED   09/04/2021 CMV DNA NOT DETECTED     IU/mL CMV Blood (no units)   Date Value 12/04/2021     <50 IU/mL  The test method detects and quantitates CMV DNA using the Abbott RealTime assay,   and is approved by the FDA for monitoring hematopoietic stem cell transplant   patients who are undergoing anti-CMV therapy.  Please correlate results with the   clinical status of the patient.     10/30/2021     <50 IU/mL  The test method detects and quantitates CMV DNA using the Abbott RealTime assay,   and is approved by the FDA for monitoring hematopoietic stem cell transplant   patients who are undergoing anti-CMV therapy.  Please correlate results with the   clinical status of the patient.     10/02/2021     <50 IU/mL  The test method detects and quantitates CMV DNA using the Abbott RealTime assay,   and is approved by the FDA for monitoring hematopoietic stem cell transplant   patients who are undergoing anti-CMV therapy.  Please correlate results with the   clinical status of the patient.     09/04/2021     <50 IU/mL  The test method detects and quantitates CMV DNA using the Abbott RealTime assay,   and is approved by the FDA for monitoring hematopoietic stem cell transplant   patients who are undergoing anti-CMV therapy.  Please correlate results with the   clinical status of the patient.     08/03/2021     <50 IU/mL  The test method detects and quantitates CMV DNA using the Abbott RealTime assay,   and is approved by the FDA for monitoring hematopoietic stem cell transplant   patients who are undergoing anti-CMV therapy.  Please correlate results with the   clinical status of the patient.       No results found for: COPIES  EBV DNA, Quant. (no units)   Date Value   04/14/2023 EBV DNA Not Detected       TACROLIMUS LEVEL:  Tacrolimus Immunoassay (ng/mL)   Date Value   04/21/2023 5.1   04/20/2023 6.1   04/19/2023 5.7   04/18/2023 4.9 (L)   04/17/2023 5.1   04/16/2023 10.0   04/15/2023 8.7   04/14/2023 7.7       CBC with Diff:      Latest Ref Rng & Units 04/21/2023     5:36 AM 04/20/2023     5:09 AM CBC with Diff   White Blood Cells 4.5 - 11.0 K/UL 6.1  6.1    RBC 4.0 - 5.0 M/UL 3.21  3.05    Hemoglobin 12.0 - 15.0 GM/DL 8.9  8.6    Hematocrit 36 - 45 % 27.9  26.0    MCV 80 - 100 FL 87.0  85.1    MCH 26 - 34 PG 27.8  28.1    MCHC 32.0 - 36.0 G/DL 91.4  78.2    RDW 11 - 15 % 14.8  14.6    Platelet Count 150 - 400 K/UL 178  162    MPV 7 - 11 FL 9.0  9.0    Neutrophils 41 - 77 % 70  72    Absolute Neutrophil Count 1.8 - 7.0 K/UL 4.30  4.41    Lymphocytes 24 -  44 % 17  16    Absolute Lymph Count 1.0 - 4.8 K/UL 1.03  0.95    Monocytes 4 - 12 % 9  9    Absolute Monocyte Count 0 - 0.80 K/UL 0.52  0.52    Eosinophils 0 - 5 % 3  2    Absolute Eosinophil Count 0 - 0.45 K/UL 0.17  0.14    Basophils 0 - 2 % 1  1    Absolute Basophil Count 0 - 0.20 K/UL 0.04  0.04      Lab Results   Component Value Date/Time    IRON 50 04/15/2023 05:28 AM    TIBC 216 (L) 04/15/2023 05:28 AM    PSAT 23 (L) 04/15/2023 05:28 AM    FERRITIN 614 (H) 04/15/2023 05:28 AM    FERRITIN 449 (H) 10/30/2021 11:27 AM       Urinalysis:  Lab Results   Component Value Date/Time    UCOLOR YELLOW 04/13/2023 05:59 PM    TURBID 2+ (A) 04/13/2023 05:59 PM    USPGR 1.011 04/13/2023 05:59 PM    UPH 5.0 04/13/2023 05:59 PM    UPROTEIN 2+ (A) 04/13/2023 05:59 PM    UAGLU 3+ (A) 04/13/2023 05:59 PM    UKET NEG 04/13/2023 05:59 PM    UBILE NEG 04/13/2023 05:59 PM    UBLD 2+ (A) 04/13/2023 05:59 PM    UROB NORMAL 04/13/2023 05:59 PM     Protein/CR ratio (no units)   Date Value   04/13/2023 1.7 (H)   01/08/2022 0.7 (H)   12/04/2021 0.7 (H)   10/30/2021 0.7 (H)   10/02/2021 0.5 (H)       Imaging:  Results for orders placed during the hospital encounter of 04/13/23    CT ABD/PELV WO CONTRAST    Impression  1. Right lower quadrant renal transplant. Persistent hydronephrosis. Perinephric and periureteral stranding is nonspecific and may be from infection or inflammation.    2. Decompressed urinary bladder about a Foley catheter. Small gas in the bladder lumen, likely from the presence of the catheter. Diffuse bladder wall thickening and perivesicular stranding, nonspecific but may be from cystitis.    3. Small to upper limits normal size retroperitoneal and pelvic lymph nodes, indeterminate but may be reactive. Early lymphoproliferative process such as post transplant lymphoproliferative disorder could potentially have this appearance. Short interval follow-up CT abdomen pelvis in 3 months recommended to evaluate for stability.    4. Mild native renal atrophy. Subcentimeter nonobstructing right renal calculi.      Finalized by Ancil Boozer, M.D. on 04/18/2023 3:18 PM. Dictated by Ancil Boozer, M.D. on 04/18/2023 3:09 PM.    Results for orders placed during the hospital encounter of 04/13/23    CHEST 2 VIEWS    Impression  No acute cardiopulmonary abnormality.      Finalized by Golda Acre, M.D. on 04/14/2023 6:57 AM. Dictated by Golda Acre, M.D. on 04/14/2023 6:56 AM.    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 volume depletion, hydronephrosis/hydroureter.  PCNU- follow up with IR and urology  DSA 04/14/23 class II weak DR53 1,000.   -Creatinine continue to trend down- labs pending   -Repeat CT in 3 months to follow-up on lymphadenopathy       # Immunosuppression: dual maintenance therapy  - FK goal 6-8, on  tac 1 bid. FK in process, patient w diarrhea   - Will stop MPA, colon biopsy shows colitis rare: Cells positive for CMV on colonic biopsy  - off prednisone for now, will consider everolimus when clinically better  - DSA as above      #. HTN:   Per primary team      # ID:  - Rare staining positive for CMV in colon biopsy, started on Valcyte     #) Unintentional weight loss   - Likely from above infections    -PET scan not scheduled yet, - will place order      #  Heme  - HH 8.9 Iron wnl.   - WBC 4.3  Plat 190     #. DM- poor control   - Endo on board. Recent A1c 11%      #  HLD  - on Crestor     # CAD s/p cath 07/03/20: superior branch LAD 80% disease, RCA 40-50% mid RCA stenosis at/around origin RV marginal branch; RV marginal branch also has ~80% stenosis; guide directed medical therapy, risk factor optimization  -On aspirin, statin, not on beta-blocker      #) Health maintenance by PCP   Skin cancer surveillance: Annual follow up with Dermatology for skin eval due to long term immunosuppression   Cancer screening: Mammogram, Pap Smear, colonoscopy per guidelines   Annual high dose flu vaccine    Avoid all live vaccines. When clinically indicated or age appropriate, can receive the Shingrix vaccine one year post transplant.      Fully vaccinated for COVID-19 [Moderna] x 2 doses. 3rd dose today.    RTC 64m  Labs every 2 weeks     Corena Herter, MD   Transplant Nephrology     Cc: Elmer Bales  Cc: Leron Croak    Please contact the [facility] at [phone] for any transplant related questions or concerns that may arise.

## 2023-04-25 NOTE — Progress Notes
LOV 10/12/2021 - 3 month f/u (overdue)   - No changes     IS: labs every month  Prograf 2 mg BID     04/14/23 DSA  Beads I Negative: No donor specific antibody is present   Beads II Positive: Weak DSA to DR53 MFI=1,000 (new positive, last DSA 08/03/21 negative - resulted while IP)     Health Maintenance:  Pap smear: no longer does   Mammogram: April 2024   Colonoscopy: 04/17/2023 - traverse colon and descending colon with erythema and edema and ulcers   Dermatology: not seeing, will ask PCP for skin check and any referral     Visit:   No issues with PCN   BG: has been >200, 30 units Tresiba at night  - seeing endo on 10/7   Diarrhea resolved, soft stools, no constipation   Labs drawn today prior to clinic visit   UOP ~2.5L - all urine through PCN  PET scan d/t new lymph node     F/u with urology 05/22/23  Repeat EGD scheduled for 07/10/23  Repeat colonoscopy scheduled 09/18/23    Karnofsky score:     Hospitalization summary from IP TC:   Admitted 9/12-9/20/24 with diarrhea and vomiting. Started on broad spectrum abx. Transplant [place] with moderate hydronephrosis. Foley placed for concern of neurogenic bladder. Stool negative, urine negative, blood cultures negative. GI consulted- EGD 9/16 with esophageal ulcer, friable mucosa around ulcers. Colonoscopy 9/16 transverse colon and descending colon with erythema and edema. Started on BID PPI and MPA held d/t ulcers seen. Biopsies positive for CMV- Valcyte started per ID. Diarrhea improved on d/c. Continued hydroureter even with foley- urology consulted and PCN placed. To f/u with urology in 4 weeks. Needs PET scan OP for unintentional weight loss.     Admit Cr: 5.46   Discharge Cr: 3.68   New discharge meds: Valcyte 450 mg q48hrs x28 days, protonix 40 mg BID x90 days, Lantus 30 units daily, novolog 10 units TID   Discharge IS:  tacrolimus 2 mg BID

## 2023-05-01 ENCOUNTER — Encounter: Admit: 2023-05-01 | Discharge: 2023-05-01 | Payer: MEDICARE

## 2023-05-01 DIAGNOSIS — D472 Monoclonal gammopathy: Secondary | ICD-10-CM

## 2023-05-06 ENCOUNTER — Encounter: Admit: 2023-05-06 | Discharge: 2023-05-06 | Payer: MEDICARE

## 2023-05-08 ENCOUNTER — Ambulatory Visit: Admit: 2023-05-08 | Discharge: 2023-05-09 | Payer: BC Managed Care – PPO

## 2023-05-08 ENCOUNTER — Encounter: Admit: 2023-05-08 | Discharge: 2023-05-08 | Payer: MEDICARE

## 2023-05-08 DIAGNOSIS — E119 Type 2 diabetes mellitus without complications: Secondary | ICD-10-CM

## 2023-05-08 DIAGNOSIS — R809 Proteinuria, unspecified: Secondary | ICD-10-CM

## 2023-05-08 DIAGNOSIS — I2510 Atherosclerotic heart disease of native coronary artery without angina pectoris: Secondary | ICD-10-CM

## 2023-05-08 DIAGNOSIS — N19 Unspecified kidney failure: Secondary | ICD-10-CM

## 2023-05-08 DIAGNOSIS — R897 Abnormal histological findings in specimens from other organs, systems and tissues: Secondary | ICD-10-CM

## 2023-05-08 DIAGNOSIS — I10 Essential (primary) hypertension: Secondary | ICD-10-CM

## 2023-05-08 DIAGNOSIS — N186 End stage renal disease: Secondary | ICD-10-CM

## 2023-05-08 DIAGNOSIS — D472 Monoclonal gammopathy: Secondary | ICD-10-CM

## 2023-05-08 DIAGNOSIS — D649 Anemia, unspecified: Secondary | ICD-10-CM

## 2023-05-08 MED ORDER — FREESTYLE LIBRE 3 PLUS SENSOR MISC DEVI
11 refills | 90.00000 days | Status: AC
Start: 2023-05-08 — End: ?

## 2023-05-08 MED ORDER — TRULICITY 3 MG/0.5 ML SC PNIJ
3 mg | SUBCUTANEOUS | 3 refills | Status: AC
Start: 2023-05-08 — End: ?

## 2023-05-09 DIAGNOSIS — Z794 Long term (current) use of insulin: Secondary | ICD-10-CM

## 2023-05-09 DIAGNOSIS — E0821 Diabetes mellitus due to underlying condition with diabetic nephropathy: Secondary | ICD-10-CM

## 2023-05-13 ENCOUNTER — Encounter: Admit: 2023-05-13 | Discharge: 2023-05-13 | Payer: MEDICARE

## 2023-05-16 ENCOUNTER — Encounter: Admit: 2023-05-16 | Discharge: 2023-05-16 | Payer: MEDICARE

## 2023-05-16 MED FILL — SODIUM CHLORIDE 0.9 % IJ SYRG: 5 days supply | Qty: 100 | Fill #2 | Status: AC

## 2023-05-17 ENCOUNTER — Encounter: Admit: 2023-05-17 | Discharge: 2023-05-17 | Payer: MEDICARE

## 2023-05-17 NOTE — Progress Notes
Contacted Sueanne Margarita to refill their medication(s) TACROLIMUS 1 MG PO CAP.    Patient declined transfer to the pharmacist to discuss, but during medication refill call patient reported unplanned healthcare visit: Admitted to  for infection about a month ago likely caused by Myfortic     The refill was processed. Ambulatory pharmacist notified.  Sudie Bailey  [facility]  [phone]

## 2023-05-22 ENCOUNTER — Encounter: Admit: 2023-05-22 | Discharge: 2023-05-22 | Payer: BC Managed Care – PPO

## 2023-05-22 ENCOUNTER — Ambulatory Visit: Admit: 2023-05-22 | Discharge: 2023-05-22 | Payer: BC Managed Care – PPO

## 2023-05-22 DIAGNOSIS — D472 Monoclonal gammopathy: Secondary | ICD-10-CM

## 2023-05-22 DIAGNOSIS — N135 Crossing vessel and stricture of ureter without hydronephrosis: Secondary | ICD-10-CM

## 2023-05-22 DIAGNOSIS — N186 End stage renal disease: Secondary | ICD-10-CM

## 2023-05-22 DIAGNOSIS — R897 Abnormal histological findings in specimens from other organs, systems and tissues: Secondary | ICD-10-CM

## 2023-05-22 DIAGNOSIS — I10 Essential (primary) hypertension: Secondary | ICD-10-CM

## 2023-05-22 DIAGNOSIS — R809 Proteinuria, unspecified: Secondary | ICD-10-CM

## 2023-05-22 DIAGNOSIS — N19 Unspecified kidney failure: Secondary | ICD-10-CM

## 2023-05-22 DIAGNOSIS — E119 Type 2 diabetes mellitus without complications: Secondary | ICD-10-CM

## 2023-05-22 DIAGNOSIS — I2510 Atherosclerotic heart disease of native coronary artery without angina pectoris: Secondary | ICD-10-CM

## 2023-05-22 DIAGNOSIS — D649 Anemia, unspecified: Secondary | ICD-10-CM

## 2023-05-22 DIAGNOSIS — N189 Chronic kidney disease, unspecified: Secondary | ICD-10-CM

## 2023-05-22 DIAGNOSIS — N200 Calculus of kidney: Secondary | ICD-10-CM

## 2023-05-22 MED FILL — TACROLIMUS 1 MG PO CAP: 1 mg | ORAL | 90 days supply | Qty: 360 | Fill #3 | Status: AC

## 2023-05-22 NOTE — Progress Notes
Interventional Radiology Outpatient Procedure Review Assessment        Reason for consult: Evaluate for right renal transplant nephrostomy tube exchange. Placed on 04/19/23.      History of present illness:  Jacqueline Shields is a 57 y.o. female patient with a history significant for transplant kidney.      Assessment:   - Review of imaging: IR Neph Tube placement 04/19/23.   - Review of recent labs and allergies: NKDA.  - Review of anticoagulation medications: Aspirin 81 mg  - Previous ASA score if available: 3 (If ASA IV, anesthesia will run sedation for procedure if sedation indicated).  - Previous IR Anesthetic/Sedation History (if applicable): Moderate Sedation  - Case discussed with referring Physician (Y/N): N  - Contraindications to procedure: N      Plan:  - This case has been reviewed and approved for renal transplant neph tube exchange  - Patient position and modality: Supine, Fluoro  - Intra-procedural Sedation/Medication Plan: Moderate sedation  - Procedure time frame: 3 months after prior placement  - Anticoagulation: Per IR peri procedural anticoagulation management protocol  - Labs: Per IR pre procedural lab protocol  - Additional requests after review: NONE    Lab/Radiology/Other Diagnostic Tests:  Labs:  Pertinent labs reviewed             Lydia Guiles, MD

## 2023-06-01 ENCOUNTER — Encounter: Admit: 2023-06-01 | Discharge: 2023-06-01 | Payer: MEDICARE

## 2023-06-03 ENCOUNTER — Encounter: Admit: 2023-06-03 | Discharge: 2023-06-03 | Payer: MEDICARE

## 2023-06-04 MED FILL — SODIUM CHLORIDE 0.9 % IJ SYRG: 5 days supply | Qty: 100 | Fill #3 | Status: AC

## 2023-06-14 ENCOUNTER — Encounter: Admit: 2023-06-14 | Discharge: 2023-06-14 | Payer: MEDICARE

## 2023-06-16 ENCOUNTER — Encounter: Admit: 2023-06-16 | Discharge: 2023-06-16 | Payer: MEDICARE

## 2023-06-19 ENCOUNTER — Encounter: Admit: 2023-06-19 | Discharge: 2023-06-19 | Payer: MEDICARE

## 2023-06-19 ENCOUNTER — Ambulatory Visit: Admit: 2023-06-19 | Discharge: 2023-06-19 | Payer: BC Managed Care – PPO

## 2023-06-19 DIAGNOSIS — D849 Immunodeficiency, unspecified: Secondary | ICD-10-CM

## 2023-06-19 DIAGNOSIS — Z940 Kidney transplant status: Secondary | ICD-10-CM

## 2023-06-20 ENCOUNTER — Encounter: Admit: 2023-06-20 | Discharge: 2023-06-20 | Payer: MEDICARE

## 2023-06-20 ENCOUNTER — Ambulatory Visit: Admit: 2023-06-20 | Discharge: 2023-06-21 | Payer: MEDICARE

## 2023-06-20 ENCOUNTER — Ambulatory Visit: Admit: 2023-06-20 | Discharge: 2023-06-20 | Payer: MEDICARE

## 2023-06-20 ENCOUNTER — Ambulatory Visit: Admit: 2023-06-20 | Discharge: 2023-06-20 | Payer: BC Managed Care – PPO

## 2023-06-20 ENCOUNTER — Ambulatory Visit: Admit: 2023-06-20 | Discharge: 2023-06-21 | Payer: BC Managed Care – PPO

## 2023-06-20 DIAGNOSIS — N135 Crossing vessel and stricture of ureter without hydronephrosis: Secondary | ICD-10-CM

## 2023-06-20 DIAGNOSIS — E1121 Type 2 diabetes mellitus with diabetic nephropathy: Secondary | ICD-10-CM

## 2023-06-20 DIAGNOSIS — D849 Immunodeficiency, unspecified: Secondary | ICD-10-CM

## 2023-06-20 DIAGNOSIS — T8610 Unspecified complication of kidney transplant: Secondary | ICD-10-CM

## 2023-06-20 DIAGNOSIS — Z940 Kidney transplant status: Secondary | ICD-10-CM

## 2023-06-20 DIAGNOSIS — D472 Monoclonal gammopathy: Secondary | ICD-10-CM

## 2023-06-20 MED ORDER — INSULIN ASPART 100 UNIT/ML SC FLEXPEN
5 [IU] | Freq: Three times a day (TID) | SUBCUTANEOUS | 1 refills | Status: CN
Start: 2023-06-20 — End: ?

## 2023-06-20 MED ADMIN — RP DX F-18 FDG MCI [210486]: 10.8 | INTRAVENOUS | @ 19:00:00 | Stop: 2023-06-20 | NDC 54029255409

## 2023-06-20 NOTE — Telephone Encounter
Notified Endocrinology NP Mackie Pai of Novolog dose change today.  Dietitian referral placed for hemoglobin A1C 11.1%.

## 2023-06-20 NOTE — Progress Notes
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  [name]   [place] [address]  Phone: [phone]  Fax: [phone]     Dear Dr. Elmer Bales,    We had the pleasure of meeting Jacqueline Shields in the [facility] for routine evaluation of her renal transplant. This is follow up from last clinic visit. She underwent IR PCN exchange, She follows with Urology, PET plan for today. No more diarrhea or illness. Losing weight. Has not done labs in last 2 months.   Myfortic has been stopped, Valcyte started.  Also will do hem labs today ( so we can save her a visit - 12/12 for labs)     REVIEW OF SYSTEMS: Comprehensive 14-point ROS reviewed  Positives noted in HPI otherwise negative.    Past History:  Past Medical History:   Diagnosis Date    Abnormal biopsy of kidney     Anemia     Chronic kidney disease 2019    Coronary artery disease due to lipid rich plaque 07/03/2020    Diabetes mellitus (HCC)     ESRD (end stage renal disease) (HCC)     Hypertension     Kidney failure     Kidney stones     MGUS (monoclonal gammopathy of unknown significance)     Proteinuria        Surgical History:   Procedure Laterality Date    ANGIOGRAPHY CORONARY ARTERY WITH LEFT HEART CATHETERIZATION N/A 07/03/2020    Performed by Harley Alto, MD at [facility] CATH LAB    POSSIBLE PERCUTANEOUS CORONARY STENT PLACEMENT WITH ANGIOPLASTY N/A 07/03/2020    Performed by Harley Alto, MD at [facility] CATH LAB    ALLOTRANSPLANTATION KIDNEY FROM NON LIVING DONOR WITHOUT RECIPIENT NEPHRECTOMY N/A 07/15/2020    Performed by Nicholos Johns, MD at [facility] OR    REMOVAL TUNNELED INTRAPERITONEAL CATHETER Bilateral 08/24/2020    Performed by Nicholos Johns, MD at [facility] OR    CYSTOURETHROSCOPY WITH REMOVAL FOREIGN BODY/ CALCULUS/ STENT FROM URETHRA/ BLADDER - SIMPLE Right 08/24/2020    [facility]    ESOPHAGOGASTRODUODENOSCOPY WITH BIOPSY - FLEXIBLE N/A 04/17/2023    Performed by Remigio Eisenmenger, MD at [facility] ENDO    COLONOSCOPY WITH BIOPSY - FLEXIBLE N/A 04/17/2023    Performed by Remigio Eisenmenger, MD at [facility] ENDO    CATHETER IMPLANT/REVISION      PD cath    CESAREAN SECTION  1991    HX HYSTERECTOMY      HX LITHOTRIPSY      HX TUBAL LIGATION  1995    KIDNEY TRANSPLANT  2021       Social History     Socioeconomic History    Marital status: Married     Spouse name: Fredrik Cove    Number of children: 2   Tobacco Use    Smoking  status: Never    Smokeless tobacco: Never   Vaping Use    Vaping status: Never Used   Substance and Sexual Activity    Alcohol use: Not Currently    Drug use: Never    Sexual activity: Not Currently     Partners: Male   Social History Narrative    [place] Works at [employer], puts labels on bottles.       Family History   Problem Relation Name Age of Onset    Diabetes Mother Mom     Hypertension Mother Mom     Diabetes Father Lukemia     Hypertension Father Lukemia     Cancer-Hematologic Father Lukemia     Cancer Father Lukemia     Diabetes Sister Larita Fife     Migraines Sister Larita Fife     Cancer Sister Larita Fife         Endometrial       No Known Allergies    Current Medications:    Current Outpatient Medications:     aspirin EC (ASPIR-LOW) 81 mg tablet, Take one tablet by mouth daily. Take with food., Disp: 90 tablet, Rfl: 3    blood sugar diagnostic (ONETOUCH VERIO TEST STRIPS) test strip, Use one strip as directed before meals and at bedtime. ICD-10: Type II Diabetes with unspecified complications; with long term insulin use E11.8, Z79.4  Indications: type 2 diabetes mellitus, Disp: 300 strip, Rfl: 3    blood-glucose sensor (FREESTYLE LIBRE 3 PLUS SENSOR) sensor device, Apply 1 sensor every 15.  Indications: type 2 diabetes mellitus, Disp: 2 each, Rfl: 11    CALCIUM PO, Take 1 tablet by mouth daily., Disp: , Rfl:     CHOLEcalciferoL (vitamin D3) 50 mcg (2,000 unit) capsule, TAKE 1 CAPSULE BY MOUTH EVERY DAY, Disp: 90 capsule, Rfl: 3    cyanocobalamin (vitamin B-12) (VITAMIN B-12 PO), Take 2 Gummy by mouth daily., Disp: , Rfl:     d-mannose 500 mg cap, Take one capsule by mouth daily., Disp: , Rfl:     dulaglutide (TRULICITY) 1.5 mg/0.5 mL injection pen, Inject 0.5 mL under the skin every 7 days., Disp: , Rfl:     ferrous sulfate (FEOSOL) 325 mg (65 mg iron) tablet, Take one tablet by mouth at bedtime daily. Take on an empty stomach at least 1 hour before or 2 hours after food., Disp: 30 tablet, Rfl: 3    glucose (DEX4 GLUCOSE) 4 gram chewable tablet, Chew four tablets by mouth as Needed. Indications: low blood sugar, Disp: 50 tablet, Rfl: 0    insulin aspart (U-100) (NOVOLOG FLEXPEN U-100 INSULIN) 100 unit/mL (3 mL) PEN, Inject five Units under the skin three times daily with meals. PLUS sliding scale If blood sugar 151-200: Take extra 2 unit. If blood sugar 201-250: Take extra 4 units. If blood sugar 251-300: Take extra 6 units. If blood sugar is 301-350: Take extra 8 units. If blood sugars > 350: Take extra 10 units.  Indications: type 2 diabetes mellitus, Disp: 45 mL, Rfl: 1    insulin glargine (LANTUS SOLOSTAR U-100 INSULIN) 100 unit/mL (3 mL) subcutaneous PEN, Inject thirty Units under the skin at bedtime daily. Indications: type 2 diabetes mellitus, Disp: 45 mL, Rfl: 0    lancets 33 gauge (ONETOUCH DELICA 33 GUAGE) 33 gauge, Use one each as directed four times daily. ICD-10: Type II Diabetes with unspecified complications; with long term insulin use E11.8, Z79.4, Disp: 300 each, Rfl: 3    pantoprazole DR (  PROTONIX) 40 mg tablet, Take one tablet by mouth twice daily for 90 days. Indications: a stomach ulcer, Disp: 180 tablet, Rfl: 0    pen needle, diabetic (BD ULTRA-FINE NANO PEN NEEDLE) 32 gauge x 5/32 pen needle, Use one each as directed as Needed. Use with insulin injections., Disp: 300 each, Rfl: 3    rosuvastatin (CRESTOR) 20 mg tablet, Take one tablet by mouth daily., Disp: 90 tablet, Rfl: 1    sodium chloride 0.9 % (flush) (NORMAL SALINE FLUSH) syringe, Use 10 mL by Intra-catheter route twice daily. Indications: nephrostomy tube, Disp: 125 mL, Rfl: 11    tacrolimus (PROGRAF) 1 mg capsule, Take two capsules by mouth twice daily., Disp: 720 capsule, Rfl: 3    Physical Exam:  Vitals:    06/20/23 0931 06/20/23 0933   BP: 113/80 112/68   BP Source: Arm, Right Upper Arm, Right Upper   Pulse: 80 90   Temp: 36.8 ?C (98.3 ?F)    SpO2: 100%    TempSrc: Oral    PainSc: Zero    Weight: 96.5 kg (212 lb 12.8 oz)    Height: 172.7 cm (5' 8)    Body mass index is 32.36 kg/m?Marland Kitchen     General: In NAD; A&Ox3  Skin: Warm, dry, no signs of rash   Neck: Supple   CV: Regular, regular rate, normal S1/S2, no murmurs  Lungs: CTA bilaterally in posterior fields  Abd: Grossly normal without rebound, guarding, masses, or bruits   Transplant: Right lower quadrant, healed,   Ext: No edema  Neuro: No focal deficits   Psych: Affect appropriat    Laboratory studies:     CMP:      Latest Ref Rng & Units 04/25/2023    11:09 AM 04/21/2023     5:36 AM 04/20/2023     5:09 AM 04/19/2023     5:21 AM 04/18/2023     5:48 AM   CMP   Sodium 137 - 147 MMOL/L 144  142  143  144  143    Potassium 3.5 - 5.1 MMOL/L 3.9  4.1  4.0  3.9  3.9    Chloride 98 - 110 MMOL/L 108  110  110  110  110    CO2 21 - 30 MMOL/L 24  24  25  25  25     Anion Gap 3 - 12 12  8  8  9  8     Blood Urea Nitrogen 7 - 25 MG/DL 31  35  32  32  34    Creatinine 0.4 - 1.00 MG/DL 1.61  0.96  0.45  4.09  4.21    Glucose 70 - 100 MG/DL 811  914  782  956  213    Calcium 8.5 - 10.6 MG/DL 9.0  7.8  7.9  7.4  7.3    Total Protein 6.0 - 8.0 G/DL 7.7  5.7  5.7  5.5  5.3    Albumin 3.5 - 5.0 G/DL 4.2  3.1  3.0  3.0  2.8    Alk Phosphatase 25 - 110 U/L 59  40  43  43  44    ALT (SGPT) 7 - 56 U/L 20  14  13  12  10     Total Bilirubin 0.2 - 1.3 MG/DL 0.4  0.2  0.2  0.2  0.2      Hemoglobin A1C (%)   Date Value   04/13/2023 11.1 (H)   10/30/2021 11.6 (H)   05/29/2021  10.8 (H)     PTH Hormone (PG/ML)   Date Value   04/25/2023 151.5 (H)   10/30/2021 195.9 (H)     No results found for: LIPASE  No results found for: AMY  BK Virus Plasma Quant (no units)   Date Value   04/14/2023 BK Virus Not Detected   10/30/2021     NOT DETECTED  Reference range: NOT DETECTED  Unit: IU/mL  .  Assay Range: 33 IU/mL to 3.30E+08 IU/mL  .  One IU is equal to 0.33 copies of BKV.  .  The limit of quantitation (LOQ) is 33 IU/mL. BK virus DNA detected  below the LOQ  will be reported as Detected:<33 IU/mL.  .  This test was developed and its performance characteristics  determined by  [facility]. It has not been cleared or approved by the U.S.  Food and Drug  Administration. Results should be used in conjunction with clinical  findings,  and should not form the sole basis for a diagnosis or treatment  decision.  ____________________________________________________________  Testing Performed At:  [facility]  [address]  [place], [place] [address]  Laboratory Director: Lind Guest Ph.D., BCLD (ABB)  CLIA#: [id number]  Phone: [phone]     10/02/2021     NOT DETECTED  Reference range: NOT DETECTED  Unit: IU/mL  TESTING PERFORMED AT LOW VOLUME ON PLASMA SPECIMEN FOR BKV qPCR, MAY  AFFECT  RESULTS.  Marland Kitchen  Assay Range: 33 IU/mL to 3.30E+08 IU/mL  .  One IU is equal to 0.33 copies of BKV.  .  The limit of quantitation (LOQ) is 33 IU/mL. BK virus DNA detected  below the LOQ  will be reported as Detected:<33 IU/mL.  .  This test was developed and its performance characteristics  determined by  [facility]. It has not been cleared or approved by the U.S.  Food and Drug  Administration. Results should be used in conjunction with clinical  findings,  and should not form the sole basis for a diagnosis or treatment  decision.  ____________________________________________________________  Testing Performed At:  [facility]  [address]  [place], [place] [address]  Laboratory Director: Lind Guest Ph.D., BCLD (ABB)  CLIA#: [id number]  Phone: [phone]     09/04/2021     NOT DETECTED  Reference range: NOT DETECTED  Unit: IU/mL  .  Assay Range: 33 IU/mL to 3.30E+08 IU/mL  .  One IU is equal to 0.33 copies of BKV.  .  The limit of quantitation (LOQ) is 33 IU/mL. BK virus DNA detected  below the LOQ  will be reported as Detected:<33 IU/mL.  .  This test was developed and its performance characteristics  determined by  [facility]. It has not been cleared or approved by the U.S.  Food and Drug  Administration. Results should be used in conjunction with clinical  findings,  and should not form the sole basis for a diagnosis or treatment  decision.  ____________________________________________________________  Testing Performed At:  [facility]  [address]  [place], [place] [address]  Laboratory Director: Lind Guest Ph.D., BCLD (ABB)  CLIA#: [id number]  Phone: [phone]     08/03/2021     NOT DETECTED  Reference range: NOT DETECTED  Unit: IU/mL  .  Assay Range: 33 IU/mL to 3.30E+08 IU/mL  .  One IU is equal to 0.33 copies of BKV.  .  The limit of quantitation (LOQ) is 33 IU/mL. BK virus DNA detected  below  the LOQ  will be reported as Detected:<33 IU/mL.  .  This test was developed and its performance characteristics  determined by  [facility]. It has not been cleared or approved by the U.S.  Food and Drug  Administration. Results should be used in conjunction with clinical  findings,  and should not form the sole basis for a diagnosis or treatment  decision.  ____________________________________________________________  Testing Performed At:  [facility]  [address]  [place], [place] [address]  Laboratory Director: Lind Guest Ph.D., BCLD (ABB)  CLIA#: [id number]  Phone: [phone]       CMV DNA Quant PCR ([IU]/mL)   Date Value   04/14/2023 CMV DNA NOT DETECTED   12/04/2021 CMV DNA NOT DETECTED   10/30/2021 CMV DNA NOT DETECTED   10/02/2021 CMV DNA NOT DETECTED   09/04/2021 CMV DNA NOT DETECTED     IU/mL CMV Blood (no units)   Date Value   12/04/2021     <50 IU/mL  The test method detects and quantitates CMV DNA using the Abbott RealTime assay,   and is approved by the FDA for monitoring hematopoietic stem cell transplant   patients who are undergoing anti-CMV therapy.  Please correlate results with the   clinical status of the patient.     10/30/2021     <50 IU/mL  The test method detects and quantitates CMV DNA using the Abbott RealTime assay,   and is approved by the FDA for monitoring hematopoietic stem cell transplant   patients who are undergoing anti-CMV therapy.  Please correlate results with the   clinical status of the patient.     10/02/2021     <50 IU/mL  The test method detects and quantitates CMV DNA using the Abbott RealTime assay,   and is approved by the FDA for monitoring hematopoietic stem cell transplant   patients who are undergoing anti-CMV therapy.  Please correlate results with the   clinical status of the patient.     09/04/2021     <50 IU/mL  The test method detects and quantitates CMV DNA using the Abbott RealTime assay,   and is approved by the FDA for monitoring hematopoietic stem cell transplant   patients who are undergoing anti-CMV therapy.  Please correlate results with the   clinical status of the patient.     08/03/2021     <50 IU/mL  The test method detects and quantitates CMV DNA using the Abbott RealTime assay,   and is approved by the FDA for monitoring hematopoietic stem cell transplant   patients who are undergoing anti-CMV therapy.  Please correlate results with the   clinical status of the patient.       No results found for: COPIES  EBV DNA, Quant. (no units)   Date Value   04/14/2023 EBV DNA Not Detected       TACROLIMUS LEVEL:  Tacrolimus Immunoassay (ng/mL)   Date Value   04/25/2023 7.3   04/21/2023 5.1   04/20/2023 6.1   04/19/2023 5.7   04/18/2023 4.9 (L)   04/17/2023 5.1   04/16/2023 10.0   04/15/2023 8.7       CBC with Diff:      Latest Ref Rng & Units 04/25/2023    11:09 AM 04/21/2023     5:36 AM   CBC with Diff   White Blood Cells 4.5 - 11.0 K/UL 7.2  6.1    RBC 4.0 - 5.0 M/UL 3.89  3.21  Hemoglobin 12.0 - 15.0 GM/DL 45.4  8.9    Hematocrit 36 - 45 % 34.4  27.9    MCV 80 - 100 FL 88.4  87.0    MCH 26 - 34 PG 28.8  27.8    MCHC 32.0 - 36.0 G/DL 09.8  11.9    RDW 11 - 15 % 15.5  14.8    Platelet Count 150 - 400 K/UL 236  178    MPV 7 - 11 FL 9.4  9.0    Neutrophils 41 - 77 % 84  70    Absolute Neutrophil Count 1.8 - 7.0 K/UL 5.98  4.30    Lymphocytes 24 - 44 % 9  17    Absolute Lymph Count 1.0 - 4.8 K/UL 0.66  1.03    Monocytes 4 - 12 % 4  9    Absolute Monocyte Count 0 - 0.80 K/UL 0.32  0.52    Eosinophils 0 - 5 % 2  3    Absolute Eosinophil Count 0 - 0.45 K/UL 0.15  0.17    Basophils 0 - 2 % 1  1    Absolute Basophil Count 0 - 0.20 K/UL 0.06  0.04      Lab Results   Component Value Date/Time    IRON 50 04/15/2023 05:28 AM    TIBC 216 (L) 04/15/2023 05:28 AM    PSAT 23 (L) 04/15/2023 05:28 AM    FERRITIN 614 (H) 04/15/2023 05:28 AM    FERRITIN 449 (H) 10/30/2021 11:27 AM       Urinalysis:  Lab Results   Component Value Date/Time    UCOLOR YELLOW 04/25/2023 11:50 AM    TURBID CLEAR 04/25/2023 11:50 AM    USPGR 1.013 04/25/2023 11:50 AM    UPH 7.0 04/25/2023 11:50 AM    UPROTEIN 2+ (A) 04/25/2023 11:50 AM    UAGLU 3+ (A) 04/25/2023 11:50 AM    UKET NEG 04/25/2023 11:50 AM    UBILE NEG 04/25/2023 11:50 AM    UBLD 2+ (A) 04/25/2023 11:50 AM    UROB NORMAL 04/25/2023 11:50 AM     Protein/CR ratio (no units)   Date Value   04/13/2023 1.7 (H)   01/08/2022 0.7 (H)   12/04/2021 0.7 (H)   10/30/2021 0.7 (H) 10/02/2021 0.5 (H)       Imaging:  Results for orders placed during the hospital encounter of 04/13/23    CT ABD/PELV WO CONTRAST    Impression  1. Right lower quadrant renal transplant. Persistent hydronephrosis. Perinephric and periureteral stranding is nonspecific and may be from infection or inflammation.    2. Decompressed urinary bladder about a Foley catheter. Small gas in the bladder lumen, likely from the presence of the catheter. Diffuse bladder wall thickening and perivesicular stranding, nonspecific but may be from cystitis.    3. Small to upper limits normal size retroperitoneal and pelvic lymph nodes, indeterminate but may be reactive. Early lymphoproliferative process such as post transplant lymphoproliferative disorder could potentially have this appearance. Short interval follow-up CT abdomen pelvis in 3 months recommended to evaluate for stability.    4. Mild native renal atrophy. Subcentimeter nonobstructing right renal calculi.      Finalized by Ancil Boozer, M.D. on 04/18/2023 3:18 PM. Dictated by Ancil Boozer, M.D. on 04/18/2023 3:09 PM.    Results for orders placed during the hospital encounter of 04/13/23    CHEST 2 VIEWS    Impression  No acute cardiopulmonary abnormality.  Finalized by Golda Acre, M.D. on 04/14/2023 6:57 AM. Dictated by Golda Acre, M.D. on 04/14/2023 6:56 AM.    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 volume depletion, hydronephrosis/hydroureter.  PCNU- conitnues to follow with IR and urology   - No labs in lst 2 months - will repeat today   DSA 04/14/23 class II weak DR53 1,000.   DSA today   - PET scan for ureteral stricture.       # Immunosuppression: dual maintenance therapy  - FK goal 6-8,   - Will stop MPA, colon biopsy shows colitis rare: Cells positive for CMV on colonic biopsy  - off prednisone for now, will consider everolimus when clinically better  - DSA as above     04/14/23  Beads I Negative: No donor specific antibody is present   Beads II Positive: Weak DSA to DR53 MFI=1,000 (new positive, last DSA 08/03/21 negative - resulted while IP)      #. HTN:   Per primary team      # ID:  - Rare staining positive for CMV in colon biopsy,   - Not on valcyte. - was on it in [place]- no follow up or labs since September      #) Unintentional weight loss   - Likely from above infections    -PET scan     #  Heme  - Labs pending today      #. DM- poor control   - Endo on board. Recent A1c 11%      #  HLD  - on Crestor     # CAD s/p cath 07/03/20: superior branch LAD 80% disease, RCA 40-50% mid RCA stenosis at/around origin RV marginal branch; RV marginal branch also has ~80% stenosis; guide directed medical therapy, risk factor optimization  -On aspirin, statin, not on beta-blocker      #) Health maintenance by PCP  Pap smear: no longer does  Mammogram: April 2024  Colonoscopy: 04/17/23  Dermatology: Per PCP    RTC 89m  Labs monthly      Corena Herter, MD   Transplant Nephrology     Cc: Elmer Bales  Cc: Leron Croak    Please contact the [facility] at [phone] for any transplant related questions or concerns that may arise.

## 2023-06-20 NOTE — Progress Notes
LOV 04/25/23 - 62mo f/u  - PET scan for unintential weight loss  - Repeat DSA October (not completed)    IS: labs every 1 month  -Prograf 2mg  bid    04/14/23  Beads I Negative: No donor specific antibody is present   Beads II Positive: Weak DSA to DR53 MFI=1,000 (new positive, last DSA 08/03/21 negative - resulted while IP)     Health Maintenance:  Pap smear: no longer does  Mammogram: April 2024  Colonoscopy: 04/17/23  Dermatology: Per PCP    Visit:   Standing labs and DSA today including SPEP, PT, Immunoglobbulins, FLC,   Diarrhea resolved  Continuous glucose monitor- BG to 60s when taking novolog, decrease 5 units + SSI, update endocrinology, pt scheduled to see again in Dec.  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

## 2023-06-20 NOTE — Telephone Encounter
Verified ok to leave message in voicemail.  Left message giving patient sign up information for [facility] since our clinic is not connected to her.  Asked her to call Durenda Age at [phone] to let her know if she is connected now or if she uses a glucose monitor, leave about a weeks worth of blood sugar readings.Marland Kitchen

## 2023-06-20 NOTE — Telephone Encounter
-----   Message from [employer], APRN-NP sent at 06/20/2023  3:29 PM CST -----  Regarding: FW: Novolog  Please pull CGM report for this patient.  [employer]a [employer], APRN-NP  ----- Message -----  From: Derrek Gu, RN  Sent: 06/20/2023   2:25 PM CST  To: Corena Herter, MD; [employer], APRN-NP  Subject: Novolog                                          Hello! We saw this patient today in transplant clinic. Patient c/o BG decreasing down to 60s. Dr Chales Abrahams decreased her Novolog from 10 units with meals + SSI to 5 units with meals + SSI. She is scheduled to see you in December. Just wanted to keep you all in the loop!    Thanks,    [employer] BSN, RN  Renal and Pancreas Transplant Coordinator  [facility] [employer] [facility] [employer] [facility]

## 2023-06-22 ENCOUNTER — Encounter: Admit: 2023-06-22 | Discharge: 2023-06-22 | Payer: MEDICARE

## 2023-06-23 ENCOUNTER — Encounter: Admit: 2023-06-23 | Discharge: 2023-06-23 | Payer: MEDICARE

## 2023-06-26 ENCOUNTER — Encounter: Admit: 2023-06-26 | Discharge: 2023-06-26 | Payer: MEDICARE

## 2023-06-27 ENCOUNTER — Encounter: Admit: 2023-06-27 | Discharge: 2023-06-27 | Payer: MEDICARE

## 2023-07-03 ENCOUNTER — Encounter: Admit: 2023-07-03 | Discharge: 2023-07-03 | Payer: MEDICARE

## 2023-07-04 ENCOUNTER — Encounter: Admit: 2023-07-04 | Discharge: 2023-07-04 | Payer: MEDICARE

## 2023-07-05 ENCOUNTER — Encounter: Admit: 2023-07-05 | Discharge: 2023-07-05 | Payer: MEDICARE

## 2023-07-05 DIAGNOSIS — D849 Immunodeficiency, unspecified: Secondary | ICD-10-CM

## 2023-07-05 DIAGNOSIS — Z940 Kidney transplant status: Secondary | ICD-10-CM

## 2023-07-05 DIAGNOSIS — Z79899 Other long term (current) drug therapy: Secondary | ICD-10-CM

## 2023-07-06 ENCOUNTER — Encounter: Admit: 2023-07-06 | Discharge: 2023-07-06 | Payer: MEDICARE

## 2023-07-07 ENCOUNTER — Encounter: Admit: 2023-07-07 | Discharge: 2023-07-07 | Payer: MEDICARE

## 2023-07-07 DIAGNOSIS — E0821 Diabetes mellitus due to underlying condition with diabetic nephropathy: Secondary | ICD-10-CM

## 2023-07-14 ENCOUNTER — Encounter: Admit: 2023-07-14 | Discharge: 2023-07-14 | Payer: MEDICARE

## 2023-07-14 ENCOUNTER — Ambulatory Visit: Admit: 2023-07-14 | Discharge: 2023-07-15 | Payer: BC Managed Care – PPO

## 2023-07-18 ENCOUNTER — Encounter: Admit: 2023-07-18 | Discharge: 2023-07-18 | Payer: MEDICARE

## 2023-07-21 ENCOUNTER — Encounter: Admit: 2023-07-21 | Discharge: 2023-07-21 | Payer: MEDICARE

## 2023-07-21 ENCOUNTER — Ambulatory Visit: Admit: 2023-07-21 | Discharge: 2023-07-21 | Payer: MEDICARE

## 2023-07-21 MED ORDER — LIDOCAINE (PF) 20 MG/ML (2 %) IJ SOLN
INTRAVENOUS | 0 refills | Status: DC
Start: 2023-07-21 — End: 2023-07-21

## 2023-07-21 MED ORDER — PROPOFOL 10 MG/ML IV EMUL 50 ML (INFUSION)(AM)(OR)
INTRAVENOUS | 0 refills | Status: DC
Start: 2023-07-21 — End: 2023-07-21
  Administered 2023-07-21: 21:00:00 150 ug/kg/min via INTRAVENOUS

## 2023-07-21 MED ORDER — PROPOFOL INJ 10 MG/ML IV VIAL
INTRAVENOUS | 0 refills | Status: DC
Start: 2023-07-21 — End: 2023-07-21

## 2023-07-21 NOTE — Anesthesia Post-Procedure Evaluation
Post-Anesthesia Evaluation    Name: Jacqueline Shields      MRN: [id number]     DOB: 25-Jun-1966     Age: 57 y.o.     Sex: female   __________________________________________________________________________     Procedure Information       Anesthesia Start Date/Time: 07/21/23 1429    Procedure: ESOPHAGOGASTRODUODENOSCOPY WITH SPECIMEN COLLECTION BY BRUSHING/ WASHING    Location: ENDO 5 / ENDO/GI    Surgeons: Amanda Cockayne, MD            Post-Anesthesia Vitals  BP: 106/60 (12/20 1450)  Temp: 36.5 ?C (97.7 ?F) (12/20 1442)  Pulse: 66 (12/20 1450)  Respirations: 14 PER MINUTE (12/20 1450)  SpO2: 98 % (12/20 1450)  O2 Device: None (Room air) (12/20 1450)   Vitals Value Taken Time   BP 106/60 07/21/23 1450   Temp 36.5 ?C (97.7 ?F) 07/21/23 1442   Pulse 66 07/21/23 1450   Respirations 14 PER MINUTE 07/21/23 1450   SpO2 98 % 07/21/23 1450   O2 Device None (Room air) 07/21/23 1450   ABP     ART BP           Post Anesthesia Evaluation Note    Evaluation location: pre/post (Endoscopy)  Patient participation: recovered; patient participated in evaluation  Level of consciousness: alert    Pain score: 0  Pain management: adequate    Hydration: normovolemia  Temperature: 36.0?C - 38.4?C  Airway patency: adequate    Perioperative Events      Postoperative Status  Cardiovascular status: hemodynamically stable  Respiratory status: spontaneous ventilation        Perioperative Events  There were no known complications for this encounter.

## 2023-07-23 ENCOUNTER — Encounter: Admit: 2023-07-23 | Discharge: 2023-07-23 | Payer: MEDICARE

## 2023-07-24 ENCOUNTER — Encounter: Admit: 2023-07-24 | Discharge: 2023-07-24 | Payer: BC Managed Care – PPO

## 2023-07-24 ENCOUNTER — Ambulatory Visit: Admit: 2023-07-24 | Discharge: 2023-07-24 | Payer: MEDICARE

## 2023-07-24 ENCOUNTER — Encounter: Admit: 2023-07-24 | Discharge: 2023-07-24 | Payer: MEDICARE

## 2023-07-25 ENCOUNTER — Encounter: Admit: 2023-07-25 | Discharge: 2023-07-25 | Payer: MEDICARE

## 2023-07-25 NOTE — Telephone Encounter
Attempted to call pt regarding results from 07/24/23.  Unable to leave message, voice mail not set up.  My chart message set up earlier.

## 2023-07-28 ENCOUNTER — Encounter: Admit: 2023-07-28 | Discharge: 2023-07-28 | Payer: MEDICARE

## 2023-07-28 NOTE — Telephone Encounter
Attempted to contact patient and patient's spouse, both Not accepting call at this time, please try again later. Unable to leave VM.

## 2023-07-31 ENCOUNTER — Encounter: Admit: 2023-07-31 | Discharge: 2023-07-31 | Payer: MEDICARE

## 2023-07-31 NOTE — Telephone Encounter
PA initiated on [employer]:

## 2023-07-31 NOTE — Telephone Encounter
PA approved.

## 2023-07-31 NOTE — Telephone Encounter
Called [facility] and LVM notifying of approval.

## 2023-08-07 ENCOUNTER — Encounter: Admit: 2023-08-07 | Discharge: 2023-08-07 | Payer: MEDICARE

## 2023-08-08 ENCOUNTER — Encounter: Admit: 2023-08-08 | Discharge: 2023-08-08 | Payer: MEDICARE

## 2023-08-12 ENCOUNTER — Ambulatory Visit: Admit: 2023-08-12 | Discharge: 2023-08-13 | Payer: Private Health Insurance - Indemnity

## 2023-08-12 ENCOUNTER — Encounter: Admit: 2023-08-12 | Discharge: 2023-08-12 | Payer: MEDICARE

## 2023-08-14 ENCOUNTER — Encounter: Admit: 2023-08-14 | Discharge: 2023-08-14 | Payer: MEDICARE

## 2023-08-14 NOTE — Telephone Encounter
Pt returned missed call. Asked that I call her back. No answer and no VM box set up. Called and LVM to pt's spouse, Fredrik Cove. Instructed Roger that pt needs to unclamp her nephrostomy tube and have repeat BMP in 3-4 weeks with an appointment. Pt scheduled for appointment on 09/07/23. Call back number provided. Asked that pt or Fredrik Cove calls back at earliest convenience.

## 2023-08-15 ENCOUNTER — Encounter: Admit: 2023-08-15 | Discharge: 2023-08-15 | Payer: MEDICARE

## 2023-08-15 DIAGNOSIS — N135 Crossing vessel and stricture of ureter without hydronephrosis: Secondary | ICD-10-CM

## 2023-08-15 DIAGNOSIS — Z9489 Other transplanted organ and tissue status: Secondary | ICD-10-CM

## 2023-08-16 ENCOUNTER — Encounter: Admit: 2023-08-16 | Discharge: 2023-08-16 | Payer: MEDICARE

## 2023-08-16 NOTE — Progress Notes
Contacted Sueanne Margarita to refill their medication(s) TACROLIMUS 1 MG PO CP24.    We were unable to reach the patient after three attempts. At this time, we will no longer attempt to contact the patient for the refill. The patient may be re-enrolled in the pharmacy refill management program at any time by contacting the pharmacy or refilling their medication. Ambulatory pharmacist notified.    Erskine Emery  [facility]  [phone]

## 2023-08-18 ENCOUNTER — Encounter: Admit: 2023-08-18 | Discharge: 2023-08-18 | Payer: MEDICARE

## 2023-08-26 ENCOUNTER — Encounter: Admit: 2023-08-26 | Discharge: 2023-08-26 | Payer: MEDICARE

## 2023-08-26 ENCOUNTER — Ambulatory Visit: Admit: 2023-08-26 | Discharge: 2023-08-27 | Payer: Private Health Insurance - Indemnity

## 2023-09-04 ENCOUNTER — Encounter: Admit: 2023-09-04 | Discharge: 2023-09-04 | Payer: MEDICARE

## 2023-09-04 NOTE — Telephone Encounter
Arti Helm    MOUNJARO 2.5 MG/0.5 ML PEN     Mackie Pai          Download Request   Print  Thank you for creating a prior authorization request using PromptPA.  The prior authorization department will contact you if additional information is needed. Once a decision is made you will be notified of the decision.  You can check the status of your request using the Check Status link. Please note down (Prior Auth EOC ID) to check status.  Prior Authorization request details:  Prior Auth (EOC) ID: [id number] Drug/Service Name: Greggory Keen 2.5 MG/0.5 ML PEN  Patient: LAKAYA TOLEN Date Requested: 09/04/2023 12:17:49    MemberID: [id number] DOB: 10-08-1965

## 2023-09-07 ENCOUNTER — Encounter: Admit: 2023-09-07 | Discharge: 2023-09-07 | Payer: MEDICARE

## 2023-09-07 ENCOUNTER — Ambulatory Visit: Admit: 2023-09-07 | Discharge: 2023-09-08 | Payer: Private Health Insurance - Indemnity

## 2023-09-08 ENCOUNTER — Encounter: Admit: 2023-09-08 | Discharge: 2023-09-08 | Payer: MEDICARE

## 2023-09-11 ENCOUNTER — Encounter: Admit: 2023-09-11 | Discharge: 2023-09-11 | Payer: MEDICARE

## 2023-09-11 MED ORDER — SULFAMETHOXAZOLE-TRIMETHOPRIM 800-160 MG PO TAB
1 | ORAL_TABLET | Freq: Every day | ORAL | 0 refills | Status: AC
Start: 2023-09-11 — End: ?

## 2023-09-11 NOTE — Telephone Encounter
 Pt had positive urine culture and needs Bactrim DS qday x 7 days for treatment. Attempted to call pt to notify, but no answer and no VM box set up. Called and LVM to pt's husband to notify that MyChart message has been sent with results. Rx sent to [facility] in [place], [place].

## 2023-09-12 ENCOUNTER — Encounter: Admit: 2023-09-12 | Discharge: 2023-09-12 | Payer: MEDICARE

## 2023-09-12 NOTE — Telephone Encounter
 Patient called to confirm whether she needs to proceed with IR nephrostomy tube exchange since she is scheduled for ureteroscopy with ureteral stricture treatment on 2/21. Message routed to Dr. Perlie Gold to further advise.

## 2023-09-20 ENCOUNTER — Ambulatory Visit: Admit: 2023-09-20 | Discharge: 2023-09-20 | Payer: MEDICARE

## 2023-09-21 ENCOUNTER — Encounter: Admit: 2023-09-21 | Discharge: 2023-09-21 | Payer: MEDICARE

## 2023-09-21 ENCOUNTER — Ambulatory Visit: Admit: 2023-09-21 | Discharge: 2023-09-22 | Payer: Private Health Insurance - Indemnity

## 2023-09-22 ENCOUNTER — Encounter: Admit: 2023-09-22 | Discharge: 2023-09-22 | Payer: MEDICARE

## 2023-09-22 ENCOUNTER — Ambulatory Visit: Admit: 2023-09-22 | Discharge: 2023-09-22 | Payer: Private Health Insurance - Indemnity

## 2023-09-22 ENCOUNTER — Ambulatory Visit: Admit: 2023-09-22 | Discharge: 2023-09-22 | Payer: MEDICARE

## 2023-09-22 MED FILL — TAMSULOSIN 0.4 MG PO CAP: 0.4 mg | ORAL | 60 days supply | Qty: 60 | Fill #1 | Status: CP

## 2023-09-22 MED FILL — HYOSCYAMINE SULFATE 0.125 MG PO TAB: 0.125 mg | ORAL | 5 days supply | Qty: 30 | Fill #1 | Status: CP

## 2023-09-22 MED FILL — PHENAZOPYRIDINE 200 MG PO TAB: 200 mg | ORAL | 2 days supply | Qty: 6 | Fill #1 | Status: CP

## 2023-09-22 MED FILL — HYDROCODONE-ACETAMINOPHEN 5-325 MG PO TAB: 5325 mg | ORAL | 2 days supply | Qty: 5 | Fill #1 | Status: CP

## 2023-09-24 ENCOUNTER — Encounter: Admit: 2023-09-24 | Discharge: 2023-09-24 | Payer: MEDICARE

## 2023-09-28 ENCOUNTER — Encounter: Admit: 2023-09-28 | Discharge: 2023-09-28 | Payer: MEDICARE

## 2023-10-02 NOTE — Progress Notes
 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  Was making some urine from her bladder, but  creatinine increased to 3.58, so urology had her uncap it again.  With the nephrostomy tube uncapped, she is not producing any urine from below. Emptying PCN bag 3-4x a day. Creatinine went back to her baseline of 3.00 with nephrostomy tube uncapped.    - Her goal is to have the nephrostomy tube removed.    LOV 06/20/23 - 23mo f/u  - Standing labs and DSA today including SPEP, PT, Immunoglobbulins, FLC,   - Continuous glucose monitor- BG to 60s when taking novolog, decrease 5 units + SSI, endocrinology was updated of change, pt scheduled to see again in Dec. 2024  - Hgb A1c 11.1%, dietitian referrral placed    IS: labs every month  - Prograf 1 mg AM, 2 mg PM    06/20/23 DSA  Beads I Negative: No donor specific antibody is present   Beads II Positive: Weak DSA to DR53 MFI=1,000     04/14/23 DSA  Beads I Negative: No donor specific antibody is present   Beads II Positive: Weak DSA to DR53 MFI=1,000     Clinic Visit:  Labs today with upkeep (A1c, Lipid, vit D--> orders in)

## 2023-10-03 ENCOUNTER — Ambulatory Visit: Admit: 2023-10-03 | Discharge: 2023-10-03 | Payer: Private Health Insurance - Indemnity

## 2023-10-03 ENCOUNTER — Encounter: Admit: 2023-10-03 | Discharge: 2023-10-03 | Payer: MEDICARE

## 2023-10-03 ENCOUNTER — Ambulatory Visit: Admit: 2023-10-03 | Discharge: 2023-10-03 | Payer: MEDICARE

## 2023-10-03 DIAGNOSIS — Z79899 Other long term (current) drug therapy: Secondary | ICD-10-CM

## 2023-10-03 DIAGNOSIS — Z940 Kidney transplant status: Secondary | ICD-10-CM

## 2023-10-03 DIAGNOSIS — E0821 Diabetes mellitus due to underlying condition with diabetic nephropathy: Secondary | ICD-10-CM

## 2023-10-03 DIAGNOSIS — D849 Immunodeficiency, unspecified: Secondary | ICD-10-CM

## 2023-10-03 DIAGNOSIS — E785 Hyperlipidemia, unspecified: Secondary | ICD-10-CM

## 2023-10-03 DIAGNOSIS — T8619 Other complication of kidney transplant: Secondary | ICD-10-CM

## 2023-10-03 NOTE — Progress Notes
 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 On PD Jan 2019  DDRT  KPDI 92%, cPRA 0%  Physical Crossmatch B cell negative T cell negative  CMV D -/ R -  Induction: Thymo  Maintenance IS: Dual  IS   PD not removed with surgery   Post-op Complications:  - None    Referring Nephrologist:  Elmer Bales  [address]  [name]   [place] [address]  Phone: [phone]  Fax: [phone]     Dear Dr. Elmer Bales,    We had the pleasure of meeting Ms. Stringfellow in the [facility] for routine evaluation of her renal transplant.  She is alone today.  She was last seen by Dr. Tamala Fothergill on 06/20/2023.  This is follow up from last clinic visit. 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  On 09/22/2023 by Dr. Perlie Gold she had her transplant nephrostomy tube removed, dilation of the transplant ureteral stricture with ureteral stent placement.     She remains off Myfortic due to colitis noted on colon biopsy.  She denies diarrhea.  She denies nausea, vomiting or fever or chills.  She reports stable appetite.  She reports stable energy level but does endorse some room for improvement with a need for increasing activity and motivation.  She reports adherence with her immunosuppression regimen.  Her primary concern today is urinary incontinence that is been present since her recent urological procedure on 09/22/2023.    She does notice some LLE swelling since her urological procedure as well but that seems to be improving.    She does not measure home blood pressures.    Home glucose readings, uses CGM: TIR 55%, 170-180 average glucose for the past 90 days.  Remains on tirzepatide, NovoLog, Tresiba, Lantus.    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    REVIEW OF SYSTEMS: Comprehensive 14-point ROS reviewed  Positives noted in HPI otherwise negative.    Past History:  Past Medical History:    Abnormal biopsy of kidney    Anemia    Chronic kidney disease    Coronary artery disease due to lipid rich plaque    DM (diabetes mellitus), type 2 (HCC)    ESRD (end stage renal disease) (HCC)    Gastroparesis    GERD (gastroesophageal reflux disease)    H/O kidney transplant    HLD (hyperlipidemia)    Hypertension    Kidney failure    Kidney stones    MGUS (monoclonal gammopathy of unknown significance)    Obesity    Proteinuria    PUD (peptic ulcer disease)    PVD (peripheral vascular disease) ()       Surgical History:   Procedure Laterality Date    ANGIOGRAPHY CORONARY ARTERY WITH LEFT HEART CATHETERIZATION N/A 07/03/2020    Performed by Harley Alto, MD at [facility] CATH LAB    POSSIBLE PERCUTANEOUS CORONARY STENT PLACEMENT WITH ANGIOPLASTY N/A 07/03/2020    Performed by Harley Alto, MD at [facility] CATH LAB    ALLOTRANSPLANTATION KIDNEY FROM NON LIVING DONOR WITHOUT RECIPIENT NEPHRECTOMY N/A 07/15/2020    Performed by Nicholos Johns, MD at [facility] OR  REMOVAL TUNNELED INTRAPERITONEAL CATHETER Bilateral 08/24/2020    Performed by Nicholos Johns, MD at [facility] OR    CYSTOURETHROSCOPY WITH REMOVAL FOREIGN BODY/ CALCULUS/ STENT FROM URETHRA/ BLADDER - SIMPLE Right 08/24/2020    Performed by Marylen Ponto, MD at [facility] OR    ESOPHAGOGASTRODUODENOSCOPY WITH BIOPSY - FLEXIBLE N/A 04/17/2023    Performed by Remigio Eisenmenger, MD at [facility] ENDO    COLONOSCOPY WITH BIOPSY - FLEXIBLE N/A 04/17/2023    Performed by Remigio Eisenmenger, MD at [facility] ENDO    ESOPHAGOGASTRODUODENOSCOPY WITH SPECIMEN COLLECTION BY BRUSHING/ WASHING N/A 07/21/2023    Performed by Amanda Cockayne, MD at [facility] ENDO    ANTEGRADE UROGRAPHY Right 09/22/2023    Performed by Willette Cluster, MD at [facility] OR    RETROGRADE UROGRAPHY WITH/ WITHOUT KUB Right 09/22/2023    Performed by Willette Cluster, MD at [facility] OR    REMOVAL, NEPHROSTOMY TUBE, WITH FLUOROSCOPIC GUIDANCE Right 09/22/2023    Performed by Willette Cluster, MD at [facility] OR    CYSTOURETHROSCOPY WITH URETEROSCOPY WITH TREATMENT URETERAL STRICTURE Right 09/22/2023    Performed by Willette Cluster, MD at [facility] OR    CATHETER IMPLANT/REVISION      PD cath    CESAREAN SECTION  1991    HX HYSTERECTOMY      still has ovaries    HX LITHOTRIPSY      HX TUBAL LIGATION  1995       Social History     Socioeconomic History    Marital status: Married     Spouse name: Roger    Number of children: 2   Tobacco Use    Smoking status: Never    Smokeless tobacco: Never   Vaping Use    Vaping status: Never Used   Substance and Sexual Activity    Alcohol use: Not Currently    Drug use: Never    Sexual activity: Not Currently     Partners: Male   Social History Narrative    [place] Works at [employer], puts labels on bottles.       Family History   Problem Relation Name Age of Onset    Diabetes Mother Mom     Hypertension Mother Mom     Diabetes Father Lukemia     Hypertension Father Lukemia     Cancer-Hematologic Father Lukemia     Cancer Father Lukemia     Diabetes Sister Larita Fife     Migraines Sister Larita Fife     Cancer Sister Larita Fife         Endometrial       No Known Allergies    Current Medications:    Current Outpatient Medications:     aspirin EC (ASPIR-LOW) 81 mg tablet, Take one tablet by mouth daily. Take with food., Disp: 90 tablet, Rfl: 3    blood sugar diagnostic (ONETOUCH VERIO TEST STRIPS) test strip, Use one strip as directed before meals and at bedtime. ICD-10: Type II Diabetes with unspecified complications; with long term insulin use E11.8, Z79.4  Indications: type 2 diabetes mellitus, Disp: 300 strip, Rfl: 3    CALCIUM PO, Take 1 tablet by mouth daily., Disp: , Rfl:     CHOLEcalciferoL (vitamin D3) 50 mcg (2,000 unit) capsule, TAKE 1 CAPSULE BY MOUTH EVERY DAY, Disp: 90 capsule, Rfl: 3    cyanocobalamin (vitamin B-12) (VITAMIN B-12 PO), Take 2 Gummy by mouth daily., Disp: , Rfl:     d-mannose 500  mg cap, Take one capsule by mouth daily., Disp: , Rfl:     DEXCOM G7 SENSOR sensor device, Use one each as directed every 10 days. Indications: type 2 diabetes mellitus, Disp: 9 each, Rfl: 3    ferrous sulfate (FEOSOL) 325 mg (65 mg iron) tablet, Take one tablet by mouth at bedtime daily. Take on an empty stomach at least 1 hour before or 2 hours after food., Disp: 30 tablet, Rfl: 3    glucose (DEX4 GLUCOSE) 4 gram chewable tablet, Chew four tablets by mouth as Needed. Indications: low blood sugar, Disp: 50 tablet, Rfl: 0    HYDROcodone/acetaminophen (NORCO) 5/325 mg tablet, Take one tablet by mouth every 6 hours as needed for Pain. Indications: pain, Disp: 5 tablet, Rfl: 0    hyoscyamine sulfate (LEVSIN) 0.125 mg tablet, Take one tablet by mouth every 4 hours as needed for Cramps. Indications: bladder spasms, Disp: 30 tablet, Rfl: 0    insulin aspart (U-100) (NOVOLOG FLEXPEN U-100 INSULIN) 100 unit/mL (3 mL) PEN, Inject 10 units subcutaneously with breakfast, 8 units with lunch, and 10 units with evening meal PLUS sliding scale If blood sugar 151-200: Take extra 2 unit. If blood sugar 201-250: Take extra 4 units. If blood sugar 251-300: Take extra 6 units. If blood sugar is 301-350: Take extra 8 units. If blood sugars > 350: Take extra 10 units. Max daily dose 45 units.  Indications: type 2 diabetes mellitus, Disp: 45 mL, Rfl: 1    insulin degludec (TRESIBA FLEXTOUCH U-100) 100 unit/mL (3 mL) subcutaneous PEN, Inject sixty Units under the skin at bedtime daily., Disp: , Rfl:     insulin glargine (LANTUS SOLOSTAR U-100 INSULIN) 100 unit/mL (3 mL) subcutaneous PEN, Inject thirty Units under the skin at bedtime daily. Indications: type 2 diabetes mellitus (Patient not taking: Reported on 09/21/2023), Disp: 45 mL, Rfl: 0    lancets 33 gauge (ONETOUCH DELICA 33 GUAGE) 33 gauge, Use one each as directed four times daily. ICD-10: Type II Diabetes with unspecified complications; with long term insulin use E11.8, Z79.4, Disp: 300 each, Rfl: 3    pen needle, diabetic (BD ULTRA-FINE NANO PEN NEEDLE) 32 gauge x 5/32 pen needle, Use one each as directed as Needed. Use with insulin injections., Disp: 300 each, Rfl: 3    phenazopyridine (PYRIDIUM) 200 mg tablet, Take one tablet by mouth three times daily as needed for Pain. Take after meals for up to 2 days.  Indications: difficult or painful urination, Disp: 6 tablet, Rfl: 0    rosuvastatin (CRESTOR) 20 mg tablet, Take one tablet by mouth daily. (Patient taking differently: Take one tablet by mouth at bedtime daily.), Disp: 90 tablet, Rfl: 1    sennosides-docusate sodium (SENNA PLUS) 8.6/50 mg tablet, Take one tablet by mouth daily. Indications: constipation, Disp: 30 tablet, Rfl: 0    sodium chloride 0.9 % (flush) (NORMAL SALINE FLUSH) syringe, Use 10 mL by Intra-catheter route twice daily. Indications: nephrostomy tube, Disp: 125 mL, Rfl: 11    tacrolimus (PROGRAF) 1 mg capsule, Take one capsule by mouth every morning AND two capsules every evening., Disp: 270 capsule, Rfl: 3    tamsulosin (FLOMAX) 0.4 mg capsule, Take one capsule by mouth daily. Do not crush, chew or open capsules. Take 30 minutes following the same meal each day.  Indications: stent, Disp: 60 capsule, Rfl: 0    tirzepatide (MOUNJARO) 2.5 mg/0.5 mL injector PEN, Inject 0.5 mL under the skin every 7 days. Indications: type 2 diabetes mellitus (Patient taking  differently: Inject 0.5 mL under the skin every Saturday. Indications: type 2 diabetes mellitus), Disp: 6 mL, Rfl: 1    Physical Exam:  Vitals:    10/03/23 1123 10/03/23 1124   BP: 108/73 104/54   BP Source: Arm, Right Upper Arm, Right Upper   Pulse: 86 96   Temp: 36.7 ?C (98 ?F)    Resp: 12    SpO2: 99%    TempSrc: Oral    PainSc: Zero    Weight: 97.5 kg (215 lb)    Height: 172.7 cm (5' 8)    Body mass index is 32.69 kg/m?Marland Kitchen       General: NAD, A+Ox4, calm and pleasant. Appears to be stated age.   HENT: Unremarkable, no oral lesions  Neck: Normal ROM, no LAD, no JVD  Lungs: Bilat. CTA  CV: RRR, S1, S2 without carotid bruit or murmur, no edema  Abdomen: Soft, N/D, N/T without hepatosplenomegaly  Incision: healed  M/S: Normal ROM and strength  Neuro: Nonfocal deficits without tremors  Skin: No skin rash  Psyc: Stable      Laboratory studies:     CMP:      Latest Ref Rng & Units 08/26/2023    11:22 AM 08/12/2023    10:39 AM 07/24/2023     1:37 PM 06/20/2023    10:20 AM 04/25/2023    11:09 AM   CMP   Sodium 137 - 147 mmol/L 144  144  143  142     142     142  144    Potassium 3.5 - 5.1 mmol/L 4.0  4.5  4.0  3.6     3.6     3.6  3.9    Chloride 98 - 110 mmol/L 108  111  108  106     107     106  108    CO2 21 - 30 mmol/L 26  23  24  26     26     26  24     Anion Gap 3 - 12 10  10  11  9     9     9  12     Blood Urea Nitrogen 7 - 25 mg/dL 42  51  38  27     27     27  31     Creatinine 0.40 - 1.00 mg/dL 1.61  0.96  0.45  4.09     2.94     2.86  3.14    Glucose 70 - 100 mg/dL 811  914  782  956     178     175  166    Calcium 8.5 - 10.6 mg/dL 9.0  8.9  9.3  8.3     8.3     8.3  9.0    Total Protein 6.0 - 8.0 g/dL 7.5   7.8  7.6     7.8  7.7    Albumin 3.5 - 5.0 g/dL 3.9   4.1  4.0     4.0  4.2    Alk Phosphatase 25 - 110 U/L 61   76  74     72  59    ALT (SGPT) 7 - 56 U/L 11   11  20     21  20     AST 7 - 40 U/L 14   12  21     21  17     Total Bilirubin 0.2 - 1.3 mg/dL  0.4   0.5  0.4     0.4  0.4    GFR >60 mL/min 17  14  18  19     18     19        Hemoglobin A1C (%)   Date Value   04/13/2023 11.1 (H)   10/30/2021 11.6 (H)   05/29/2021 10.8 (H)     PTH Hormone   Date Value   07/24/2023 104.0 pg/mL (H)   04/25/2023 151.5 PG/ML (H)   10/30/2021 195.9 PG/ML (H)     No results found for: LIPASE  No results found for: AMY  BK Virus Plasma Quant (no units)   Date Value   04/14/2023 BK Virus Not Detected   10/30/2021     NOT DETECTED  Reference range: NOT DETECTED  Unit: IU/mL  .  Assay Range: 33 IU/mL to 3.30E+08 IU/mL  .  One test was developed and its performance characteristics  determined by  [facility]. It has not been cleared or approved by the U.S.  Food and Drug  Administration. Results should be used in conjunction with clinical  findings,  and should not form the sole basis for a diagnosis or treatment  decision.  ____________________________________________________________  Testing Performed At:  [facility]  [address]  [place], [place] [address]  Laboratory Director: Lind Guest Ph.D., BCLD (ABB)  CLIA#: [id number]  Phone: [phone]     08/03/2021     NOT DETECTED  Reference range: NOT DETECTED  Unit: IU/mL  .  Assay Range: 33 IU/mL to 3.30E+08 IU/mL  .  One IU is equal to 0.33 copies of BKV.  .  The limit of quantitation (LOQ) is 33 IU/mL. BK virus DNA detected  below the LOQ  will be reported as Detected:<33 IU/mL.  .  This test was developed and its performance characteristics  determined by  [facility]. It has not been cleared or approved by the U.S.  Food and Drug  Administration. Results should be used in conjunction with clinical  findings,  and should not form the sole basis for a diagnosis or treatment  decision.  ____________________________________________________________  Testing Performed At:  [facility]  [address]  [place], [place] [address]  Laboratory Director: Lind Guest Ph.D., BCLD (ABB)  CLIA#: [id number]  Phone: [phone]       BK Virus Plasma (no units)   Date Value   08/26/2023 Not Detected   07/24/2023 Not Detected   06/20/2023 Not Detected     CMV DNA Quant PCR ([IU]/mL)   Date Value   04/14/2023 CMV DNA NOT DETECTED   12/04/2021 CMV DNA NOT DETECTED   10/30/2021 CMV DNA NOT DETECTED   10/02/2021 CMV DNA NOT DETECTED   09/04/2021 CMV DNA NOT DETECTED     IU/mL CMV Blood (no units)   Date Value   12/04/2021     <50 IU/mL  The test method detects and quantitates CMV DNA using the Abbott RealTime assay,   and is approved by the FDA for monitoring hematopoietic stem cell transplant   patients who are undergoing anti-CMV therapy.  Please correlate results with the   clinical status of the patient.     10/30/2021     <50 IU/mL  The test method detects and quantitates CMV DNA using the Abbott RealTime assay,   and is approved by the FDA for monitoring hematopoietic stem cell transplant   patients who are undergoing anti-CMV therapy.  Please correlate results with  the   clinical status of the patient.     10/02/2021     <50 IU/mL  The test method detects and quantitates CMV DNA using the Abbott RealTime assay,   and is approved by the FDA for monitoring hematopoietic stem cell transplant   patients who are undergoing anti-CMV therapy.  Please correlate results with the   clinical status of the patient.     09/04/2021     <50 IU/mL  The test method detects and quantitates CMV DNA using the Abbott RealTime assay,   and is approved by the FDA for monitoring hematopoietic stem cell transplant   patients who are undergoing anti-CMV therapy.  Please correlate results with the   clinical status of the patient.     08/03/2021     <50 IU/mL  The test method detects and quantitates CMV DNA using the Abbott RealTime assay,   and is approved by the FDA for monitoring hematopoietic stem cell transplant   patients who are undergoing anti-CMV therapy.  Please correlate results with the   clinical status of the patient.       No results found for: COPIES  EBV DNA, Quant. (no units)   Date Value   04/14/2023 EBV DNA Not Detected       TACROLIMUS LEVEL:  Tacrolimus Immunoassay (ng/mL)   Date Value   08/26/2023 5.1   07/24/2023 6.1   06/20/2023 11.4   04/25/2023 7.3   04/21/2023 5.1   04/20/2023 6.1   04/19/2023 5.7   04/18/2023 4.9 (L)   04/17/2023 5.1   04/16/2023 10.0   04/15/2023 8.7       CBC with Diff:      Latest Ref Rng & Units 08/26/2023    11:22 AM 07/24/2023     1:37 PM   CBC with Diff   WBC 4.50 - 11.00 10*3/uL 7.70  10.50    RBC 4.00 - 5.00 10*6/uL 4.02  4.29    Hemoglobin 12.0 - 15.0 g/dL to upper limits normal size retroperitoneal and pelvic lymph nodes, indeterminate but may be reactive. Early lymphoproliferative process such as post transplant lymphoproliferative disorder could potentially have this appearance. Short interval follow-up CT abdomen pelvis in 3 months recommended to evaluate for stability.    4. Mild native renal atrophy. Subcentimeter nonobstructing right renal calculi.      Finalized by Ancil Boozer, M.D. on 04/18/2023 3:18 PM. Dictated by Ancil Boozer, M.D. on 04/18/2023 3:09 PM.    Results for orders placed during the hospital encounter of 04/13/23    CHEST 2 VIEWS    Impression  No acute cardiopulmonary abnormality.      Finalized by Golda Acre, M.D. on 04/14/2023 6:57 AM. Dictated by Golda Acre, M.D. on 04/14/2023 6:56 AM.    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    Diarrhea 04/18/2023    Weight loss 04/18/2023    Ulcer of esophagus without bleeding 04/18/2023    Gastric ulcer without hemorrhage or perforation 04/18/2023    Colonic ulcer 04/18/2023    Severe malnutrition () 04/17/2023    Metabolic acidosis 01/20/2021    Status post biopsy of kidney 10/21/2020    Dyslipidemia 08/03/2020    ATN (acute tubular necrosis) () 07/30/2020    Obesity 07/15/2020    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 Jacqueline Shields is a 58 year old female with history of ESKD due to diabetes biopsy proven requiring dialysis HD then PD s/p DDRT on 07/15/2020.  Currently status post ureteral stricture of kidney transplant with recent dilation of transplant ureteral stricture and new placement of ureteral stent on 09/22/2023     # Renal function: Baseline creatinine had been around 2 and most recently 3.-Pending from today   last kidney transplant biopsy 10/21/2020: no evidence of rejection, +tubular injury, mild IFTA (~15-20%) and some Ii-IFTA, no significant tubulitis or ptc, no glomerulitis, arteries a little on the thick side, mild to moderate arteriosclerosis in large vessels, arterioles ok, C4d negative, glomeruli look ok, IF panel negative;      AKI on 04/13/2023, admitted with sCr of 5.4. volume depletion, hydronephrosis/hydroureter.  PCNU removed 09/22/2023, dilation of transplant ureteral stricture and placement of ureteral stent done on 09/22/2023.  Patient reports urinary incontinence since this procedure.  Message sent to Dr. Jeral Fruit. Manual Meier for follow-up.  DSA 04/14/23 class II weak DR53 1,000.     - PET scan on 06/20/2023: Findings compatible with reactive lymph nodes with less consideration for pathological process or PTLD      # Immunosuppression: Monotherapy maintenance therapy (previously dual therapy)  -Tacrolimus trough goal 6-8, pending today  - Remains off MPA, 04/17/2023 colon biopsy shows colitis rare: Cells positive for CMV on colonic biopsy  - off prednisone for now, previous plans for everolimus when clinically better-will discuss with Dr. Manual Meier  - DSA today     04/14/23  Beads I Negative: No donor specific antibody is present   Beads II Positive: Weak DSA to DR53 MFI=1,000 (new positive, last DSA 08/03/21 negative - resulted while IP)      #. HTN:   Not on antihypertensive medications     # ID:  - Rare staining positive for CMV in colon biopsy,   - No longer on valcyte. -     #) Unintentional weight loss   - now stabilized  Wt Readings from Last 5 Encounters:   10/03/23 97.5 kg (215 lb)   09/22/23 96.2 kg (212 lb 1.3 oz)   09/21/23 97.1 kg (214 lb)   09/07/23 98.6 kg (217 lb

## 2023-10-03 NOTE — Telephone Encounter
 Lm with the pt- she had a 1040 appt and has not arrived- LM for her to please call and reschedule

## 2023-10-05 ENCOUNTER — Encounter: Admit: 2023-10-05 | Discharge: 2023-10-05 | Payer: MEDICARE

## 2023-10-05 MED ORDER — CEPHALEXIN 250 MG PO CAP
250 mg | ORAL_CAPSULE | Freq: Two times a day (BID) | ORAL | 0 refills | Status: AC
Start: 2023-10-05 — End: ?
  Filled 2023-10-05: qty 14, 7d supply, fill #1

## 2023-10-05 NOTE — Telephone Encounter
 Received the following order from Tashra, APRN,     Start keflex 250 mg po q12h x7 days (this is renal dosed).     NC called patient with new med instructions. Unable to leave V/M as it was not set up. Mychart message sent with request to reply.  Script sent to [employer].

## 2023-10-06 ENCOUNTER — Encounter: Admit: 2023-10-06 | Discharge: 2023-10-06 | Payer: MEDICARE

## 2023-10-06 NOTE — Telephone Encounter
 Labs reviewed, blood counts stable, Cr 3.0, within baseline.  Tacrolimus 9.3, within goal- high end.  UPC 1.2.  Urine cx > 100,000 E Coli, prescribed abx. BK/CMV neg. Jacqueline Shields was unable to get in contact with patient yesterday regarding starting antibiotic for UTI, [facility] still unread. This coordinator attempted to call patient this morning, VM not set up. TC to spouse, left VM requesting call back from patient.    10/03/23 DSA  Beads I Negative: No donor specific antibody is present   Beads II Negative: No donor specific antibody is present      Staff message sent to Urologist to notify him of symptoms patient is experiencing post procedure 09/22/23.

## 2023-10-09 ENCOUNTER — Encounter: Admit: 2023-10-09 | Discharge: 2023-10-09 | Payer: MEDICARE

## 2023-10-09 NOTE — Telephone Encounter
 Attempted to contact patient to discuss urinary incontinence that developed after surgery. Patient's cell phone went straight to VM and there was no VM box set up. Called and left generic message for patient's husband. MyChart message sent.

## 2023-10-10 ENCOUNTER — Encounter: Admit: 2023-10-10 | Discharge: 2023-10-10 | Payer: MEDICARE

## 2023-10-11 ENCOUNTER — Encounter: Admit: 2023-10-11 | Discharge: 2023-10-11 | Payer: MEDICARE

## 2023-10-11 NOTE — Progress Notes
 Pharmacy Benefits Investigation    Medication name: everolimus (immunosuppressive) (ZORTRESS) 0.5 mg tablet  Medication status: new  Medication regimen: Take one tablet by mouth twice daily.    The insurance requires a prior authorization for the medication. The prior authorization was submitted via [employer]. via Rx Benefits [place] [place]. Will follow up in 2 business days.    EOC ID: [id number]    Clinicals and questionnaire were faxed to [phone].  Vito Backers  Specialty Pharmacy Patient Advocate

## 2023-10-12 ENCOUNTER — Encounter: Admit: 2023-10-12 | Discharge: 2023-10-12 | Payer: MEDICARE

## 2023-10-12 NOTE — Progress Notes
 Pharmacy Benefits Investigation    Medication name: everolimus (immunosuppressive) (ZORTRESS) 0.5 mg tablet  Medication status: new  Medication regimen: Take one tablet by mouth twice daily.    The prior authorization was denied by insurance. The insurance did not provide an alternative that is covered.    Insurance provided the following reasons for the denial: Prior authorization requirements have not been met..    If an appeal is pursued, it should be submitted by fax to [phone].    If an appeal is pursued, it should be submitted [phone].    If an alternative therapy is not appropriate and the prescribed medication and dose are necessary, the insurance will require the decision be appealed. This will require a letter for medical necessity. The clinic pharmacist was notified. To proceed with an appeal, completed appeal materials should be returned to the specialty pharmacy team. Will follow up in 7 business days if no response. Please contact the specialty pharmacy with any questions regarding next steps.  Vito Backers  Specialty Pharmacy Patient Advocate

## 2023-10-13 ENCOUNTER — Ambulatory Visit: Admit: 2023-10-13 | Discharge: 2023-10-14 | Payer: Private Health Insurance - Indemnity

## 2023-10-13 ENCOUNTER — Encounter: Admit: 2023-10-13 | Discharge: 2023-10-13 | Payer: MEDICARE

## 2023-10-13 MED ORDER — MOUNJARO 5 MG/0.5 ML SC PNIJ
5 mg | SUBCUTANEOUS | 3 refills | Status: AC
Start: 2023-10-13 — End: ?

## 2023-10-13 NOTE — Progress Notes
 Date of Service: 10/13/2023    Subjective:             Jacqueline Shields is a 58 y.o. female.    History of Present Illness  Jacqueline Shields presents to [facility] for management of diabetes mellitus.     Type 2 Diabetes mellitus  Dx: more than 20 years.     A1c today was 7.5%, it was 8.4%, it was 11.1% 04/13/2023.  Previous A1c was 7.5% in 12/2022  POC glucose is 155    Interval history:Jacqueline Shields is a 58 y.o. 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     We started [facility] at her last visit (replacing Trulicity).  Jacqueline Shields has worked well for her, bringing her A1c down.  She is tolerating this without side effects.    Current DM regimen: Novolog 10 units plus sliding scale with meals - only with evening meal during the week, Lantus 30 units daily, Mounjaro mg weekly   Novolog sliding scale:     If blood sugar 151-200: Take extra 2 unit.  If blood sugar 201-250: Take extra 4 units.  If blood sugar 251-300: Take extra 6 units.  If blood sugar is 301-350: Take extra 8 units.  If blood sugars > 350: Take extra 10 units.  Past treatment: Metformin - stopped years ago - due to CKD  Adherence to medications: some difficulty getting medications.  FSBG frequency: before breakfast and before dinner.  Hyperglycemia: yes  Hypoglycemia: yes - sometimes overnight - down to low 60s, gets sweaty  Hypoglycemia unawareness?: no  Meals per day / Carb intake: 1-2 meals per day, tries to limit her carbohydrates, doesn't count them  Exercise: on her feet at work.  Dyspnea/Chest pain with exertion: no  Last DM education / nutritionist visit: years     Complications of DM:  CAD: yes  CVA: No  PVD: No  Amputations: No  Retinopathy: No/maybe  -- Last Dilated Eye exam 2023, returns in 10/27/2023  Gastropathy: No  Nephropathy: yes  Neuropathy: yes  Depression: No  Chronic wounds / delayed healing: No  DM related hospitalizations: No     Works at [employer] in [place] - she has been there 39 years, struggles to get Novolog and check blood sugars at work.      Continuous Glucose Monitoring Analysis and Interpretation  Indication for Device Placement: Type 2 Insulin Dependent Diabetic, and frequent hypoglycemia (50mg ) episodes  Name/Type of Device Placed: FreeStyle Libre  Continuous glucose monitor downloaded and personally reviewed by me on 10/13/2023.    Analysis of Data  Date Range: 30 days  Sensor usage time (%): 77.5  Average glucose (mg/dL): 161  Coefficient of Variation (%): 34.5  Time in range (%): 39  Time above range (%): 35              Time above 250mg /dL (%): 26  Time below range (%): 0              Time below 54mg /dL (%): 0    Assessment  Trends/Patterns observed  Hyperglycemia: Post prandial hyperglycemia, greatest from 3 pm to 3 am  Hypoglycemia: no recent hypoglycemia      Past Medical History:    Abnormal biopsy of kidney    Anemia    Chronic kidney disease    Coronary artery disease due to lipid rich plaque  DM (diabetes mellitus), type 2 (CMS-HCC)    ESRD (end stage renal disease) (CMS-HCC)    Gastroparesis    GERD (gastroesophageal reflux disease)    H/O kidney transplant    HLD (hyperlipidemia)    Hypertension    Kidney failure    Kidney stones    MGUS (monoclonal gammopathy of unknown significance)    Obesity    Proteinuria    PUD (peptic ulcer disease)    PVD (peripheral vascular disease)      Surgical History:   Procedure Laterality Date    ANGIOGRAPHY CORONARY ARTERY WITH LEFT HEART CATHETERIZATION N/A 07/03/2020    Performed by Harley Alto, MD at [facility] CATH LAB    POSSIBLE PERCUTANEOUS CORONARY STENT PLACEMENT WITH ANGIOPLASTY N/A 07/03/2020    Performed by Harley Alto, MD at [facility] CATH LAB    ALLOTRANSPLANTATION KIDNEY FROM NON LIVING DONOR WITHOUT RECIPIENT NEPHRECTOMY N/A 07/15/2020    Performed by Nicholos Johns, MD at [facility] OR    REMOVAL TUNNELED INTRAPERITONEAL CATHETER Bilateral 08/24/2020    Performed by Nicholos Johns, MD at [facility] OR    CYSTOURETHROSCOPY WITH REMOVAL FOREIGN BODY/ CALCULUS/ STENT FROM URETHRA/ BLADDER - SIMPLE Right 08/24/2020    Performed by Marylen Ponto, MD at [facility] OR    ESOPHAGOGASTRODUODENOSCOPY WITH BIOPSY - FLEXIBLE N/A 04/17/2023    Performed by Remigio Eisenmenger, MD at [facility] ENDO    COLONOSCOPY WITH BIOPSY - FLEXIBLE N/A 04/17/2023    Performed by Remigio Eisenmenger, MD at [facility] ENDO    ESOPHAGOGASTRODUODENOSCOPY WITH SPECIMEN COLLECTION BY BRUSHING/ WASHING N/A 07/21/2023    Performed by Amanda Cockayne, MD at [facility] ENDO    ANTEGRADE UROGRAPHY Right 09/22/2023    Performed by Willette Cluster, MD at [facility] OR    RETROGRADE UROGRAPHY WITH/ WITHOUT KUB Right 09/22/2023    Performed by Willette Cluster, MD at [facility] OR    REMOVAL, NEPHROSTOMY TUBE, WITH FLUOROSCOPIC GUIDANCE Right 09/22/2023    Performed by Willette Cluster, MD at [facility] OR    CYSTOURETHROSCOPY WITH URETEROSCOPY WITH TREATMENT URETERAL STRICTURE Right 09/22/2023    Performed by Willette Cluster, MD at [facility] OR    CATHETER IMPLANT/REVISION      PD cath    CESAREAN SECTION  1991    HX HYSTERECTOMY      still has ovaries    HX LITHOTRIPSY      HX TUBAL LIGATION  1995      Review of Systems   Constitutional:  Negative for fatigue and unexpected weight change.   Respiratory:  Negative for shortness of breath.    Cardiovascular:  Negative for palpitations.         Objective:          aspirin EC (ASPIR-LOW) 81 mg tablet Take one tablet by mouth daily. Take with food.    blood sugar diagnostic (ONETOUCH VERIO TEST STRIPS) test strip Use one strip as directed before meals and at bedtime. ICD-10: Type II Diabetes with unspecified complications; with long term insulin use E11.8, Z79.4  Indications: type 2 diabetes mellitus    CALCIUM PO Take 1 tablet by mouth daily.    cephalexin (KEFLEX) 250 mg capsule Take one capsule by mouth every 12 hours for 7 days.    CHOLEcalciferoL (vitamin D3) 50 mcg (2,000 unit) capsule TAKE 1 CAPSULE BY MOUTH EVERY DAY    cyanocobalamin (vitamin B-12) (VITAMIN B-12 PO) Take 2 Gummy by mouth daily.    d-mannose 500 mg cap Take one  capsule by mouth daily.    DEXCOM G7 SENSOR sensor device Use one each as directed every 10 days. Indications: type 2 diabetes mellitus    ferrous sulfate (FEOSOL) 325 mg (65 mg iron) tablet Take one tablet by mouth at bedtime daily. Take on an empty stomach at least 1 hour before or 2 hours after food.    glucose (DEX4 GLUCOSE) 4 gram chewable tablet Chew four tablets by mouth as Needed. Indications: low blood sugar    HYDROcodone/acetaminophen (NORCO) 5/325 mg tablet Take one tablet by mouth every 6 hours as needed for Pain. Indications: pain    hyoscyamine sulfate (LEVSIN) 0.125 mg tablet Take one tablet by mouth every 4 hours as needed for Cramps. Indications: bladder spasms    insulin aspart (U-100) (NOVOLOG FLEXPEN U-100 INSULIN) 100 unit/mL (3 mL) PEN Inject 10 units subcutaneously with breakfast, 8 units with lunch, and 10 units with evening meal PLUS sliding scale If blood sugar 151-200: Take extra 2 unit. If blood sugar 201-250: Take extra 4 units. If blood sugar 251-300: Take extra 6 units. If blood sugar is 301-350: Take extra 8 units. If blood sugars > 350: Take extra 10 units. Max daily dose 45 units.  Indications: type 2 diabetes mellitus    insulin degludec (TRESIBA FLEXTOUCH U-100) 100 unit/mL (3 mL) subcutaneous PEN Inject sixty Units under the skin at bedtime daily.    insulin glargine (LANTUS SOLOSTAR U-100 INSULIN) 100 unit/mL (3 mL) subcutaneous PEN Inject thirty Units under the skin at bedtime daily. Indications: type 2 diabetes mellitus    lancets 33 gauge (ONETOUCH DELICA 33 GUAGE) 33 gauge Use one each as directed four times daily. ICD-10: Type II Diabetes with unspecified complications; with long term insulin use E11.8, Z79.4    pen needle, diabetic (BD ULTRA-FINE NANO PEN NEEDLE) 32 gauge x 5/32 pen needle Use one each as directed as Needed. Use with insulin injections.    rosuvastatin (CRESTOR) 20 mg tablet Take one tablet by mouth daily.    sennosides-docusate sodium (SENNA PLUS) 8.6/50 mg tablet Take one tablet by mouth daily. Indications: constipation    sodium chloride 0.9 % (flush) (NORMAL SALINE FLUSH) syringe Use 10 mL by Intra-catheter route twice daily. Indications: nephrostomy tube    tacrolimus (PROGRAF) 1 mg capsule Take one capsule by mouth twice daily. Indications: prevent kidney transplant rejection    tamsulosin (FLOMAX) 0.4 mg capsule Take one capsule by mouth daily. Do not crush, chew or open capsules. Take 30 minutes following the same meal each day.  Indications: stent    tirzepatide (MOUNJARO) 5 mg/0.5 mL injector EN Inject 0.5 mL under the skin every 7 days.     Vitals:    10/13/23 0946   BP: 107/76   BP Source: Arm, Right Upper   Pulse: 71   Temp: 36.4 ?C (97.5 ?F)   PainSc: Zero   Weight: 96.2 kg (212 lb)   Height: 172.7 cm (5' 8)      Body mass index is 32.23 kg/m?Marland Kitchen   BP Readings from Last 4 Encounters:   10/13/23 107/76   10/03/23 104/54   09/22/23 137/88   09/07/23 (!) 145/78      Wt Readings from Last 4 Encounters:   10/13/23 96.2 kg (212 lb)   10/03/23 97.5 kg (215 lb)   09/22/23 96.2 kg (212 lb 1.3 oz)   09/21/23 97.1 kg (214 lb)      Comprehensive Metabolic Profile    Lab Results   Component Value Date/Time  NA 144 10/03/2023 12:08 PM    K 4.0 10/03/2023 12:08 PM    CL 111 (H) 10/03/2023 12:08 PM    CO2 24 10/03/2023 12:08 PM    GAP 9 10/03/2023 12:08 PM    BUN 45 (H) 10/03/2023 12:08 PM    CR 3.02 (H) 10/03/2023 12:08 PM    GLU 71 10/03/2023 12:08 PM    Lab Results   Component Value Date/Time    CA 8.7 10/03/2023 12:08 PM    PO4 3.2 10/03/2023 12:08 PM    ALBUMIN 4.1 10/03/2023 12:08 PM    TOTPROT 7.7 10/03/2023 12:08 PM    ALKPHOS 64 10/03/2023 12:08 PM    AST 16 10/03/2023 12:08 PM    ALT 10 10/03/2023 12:08 PM    TOTBILI 0.5 10/03/2023 12:08 PM    GFR 17 (L) 10/03/2023 12:08 PM    GFRAA 23 (L) 07/03/2020 09:24 AM        No results found for: MCALB24   No results found for: URMALBCRRAT  No results found for: [facility]    Lab Results   Component Value Date    CHOL 124 04/25/2023    TRIG 212 (H) 04/25/2023    HDL 41 04/25/2023    LDL 50 04/25/2023    VLDL 42 04/25/2023    NONHDLCHOL 83 04/25/2023    CHOLHDLC 5 06/29/2020        TSH   Date Value Ref Range Status   06/29/2020 2.98  Final       Hemoglobin A1C   Date Value Ref Range Status   04/13/2023 11.1 (H) 4.0 - 5.7 % Final     Comment:     The ADA recommends that most patients with type 1 and type 2 diabetes maintain   an A1c level <7%.     10/30/2021 11.6 (H) 4.0 - 5.7 % Final     Comment:     The ADA recommends that most patients with type 1 and type 2 diabetes maintain   an A1c level <7%.     05/29/2021 10.8 (H) 4.0 - 6.0 % Final     Comment:     The ADA recommends that most patients with type 1 and type 2 diabetes maintain   an A1c level <7%.       Poc Hemoglobin A1C   Date Value Ref Range Status   10/13/2023 7.5 (A) 4 - 6 % Final         Lab Results   Component Value Date    PLTCT 229 10/03/2023      Physical Exam  Constitutional:       Appearance: Normal appearance.   Pulmonary:      Effort: Pulmonary effort is normal.   Neurological:      Mental Status: She is alert.   Psychiatric:         Behavior: Behavior normal.          Assessment and Plan:  Diabetes mellitus type 2,  not well controlled   A1c 7.5% in clinic today - Not At Goal   Target A1c <7% without significant or frequent hypoglycemia  Currently on Novolog 10 units plus sliding scale with meals (2 units per 50 pts greater than 150) - only with evening meal during the week, Lantus 30 units daily, Trulicity 1.5 mg weekly    DM Complications:CAD, neuropathy, nephropathy  Assessment of glycemic control: above goal- improving    Plan:  Increase Mounjaro to 5 mg once weekly.  Continue Novolog 10 units plus sliding scale with meals (2 units per 50 pts greater than 150) - only with evening meal during the week, may take 5 units plus sliding school with small meals  Decrease Lantus 24 units daily - would decrease further with low blood sugars.  We did discuss that her A1c is improved, but CGM reflects numbers very close to her last A1c/visit.  She has taken note.  Discussed rule of 15 and how to tx hypoglycemia  Reminded pt to rotate injection sites  Reminded pt to have medical alert bracelet    Diabetic Co-morbidities - prevention and management:  - Annual labs (electrolytes and renal function): 06/20/2023  -- Retinopathy - Annual eye exam: planned for 10/27/2023  - Hypertension: no . Goal BP <130/80.    - Dyslipidemia - yes, taking Rosuvastatin 20 mg daily. Last lipid profile: 04/2023  - Nephropathy: yes. Annual Urine microalbumin/Cr: due now.    ACE/ARB?: no  - Neuropathy: yes .  Monofilament exam (annual): 07/14/2023. Discussed foot care. Need DM shoes: no   - Diabetic Educator and/or nutritionist visit (annually): declined    BMI 32.23  Discussed patient's BMI with her.  The body mass index is 32.23 kg/m?Marland Kitchen and falls within the category of Obesity 1 (30  to <35); BMI plan is in progress.      Plan of care discussed with patient and patient is agreeable.      Total Time Today was 30 minutes in the following activities: Preparing to see the patient, Performing a medically appropriate examination and/or evaluation, Counseling and educating the patient/family/caregiver, and Documenting clinical information in the electronic or other health record    RTC 3 months                       Encounter Medications   Medications    tirzepatide (MOUNJARO) 5 mg/0.5 mL injector EN     Sig: Inject 0.5 mL under the skin every 7 days.     Dispense:  6 mL     Refill:  3     Collaborating / Supervising Provider:   Glyn Ade [[id number]]      Patient Instructions   Thank you for meeting with me today.  It was great to see you!    Plans we discussed during our visit:    1) Increase Mounjaro to 5.0 mg once weekly when you are able.    2) Decrease Lantus/Tresiba to 24 units once daily when you start the higher dose of Mounjaro.  If you have low blood sugar - let us know and we will take away more insulin.  3) We won't change Novolog right now.  4) Continue using CGM    Please message me in My Chart or contact my nurse Christy at [phone] with any questions.    [employer], APRN-NP     Many patients have side effects from the GLP1 medications at the beginning and with dose increases. Most people feel better once they are at the higher dose for a while and their body adjusts. Below are some tips I've found that can help with the side effects:     To prevent nausea:  Reduce portion sizes  Can try injecting the medication in the thigh   Can try injecting the medication in the evening   If you normally eat 3 meals a day, try 4 smaller meals daily  Eat regularly and do not skip meals  Stop  eating when you start to feel full. Eat slowly  Avoid intense activity after you eat (moderate activity like walking is great)  Stay hydrated, but sip water throughout the day instead of drinking large amounts at once     If you have nausea:   Eat bland, low-fat foods like crackers, toast, and rice  Eat foods that contain water, like soups and gelatin  Avoid fried, greasy, or sweet foods  Avoid strong-smelling foods  Avoid lying down after you eat  Go for a walk after eating  Go outdoors for fresh air  Eat more slowly  Drink clear or ice-cold drinks  Try separating your food and beverages by 30 minutes     If you have constipation:  Drink plenty of water  Eat foods with a higher fiber content  Increase activity - especially walking  Try fiber supplements  Talk to your provider about medications for constipation     If you have diarrhea:   Sip water throughout the day instead of drinking large amounts at once  Avoid foods or drinks with lactose, caffeine, alcohol, sugar, or juice  Avoid artificial sweeteners such as sorbitol, mannitol, or xylitol  Eat foods that are low in fat and also low in fiber

## 2023-10-14 DIAGNOSIS — Z794 Long term (current) use of insulin: Secondary | ICD-10-CM

## 2023-10-14 DIAGNOSIS — E1165 Type 2 diabetes mellitus with hyperglycemia: Secondary | ICD-10-CM

## 2023-10-14 MED FILL — TACROLIMUS 1 MG PO CAP: 1 mg | ORAL | 90 days supply | Qty: 180 | Fill #1 | Status: AC

## 2023-10-19 ENCOUNTER — Encounter: Admit: 2023-10-19 | Discharge: 2023-10-19 | Payer: Private Health Insurance - Indemnity

## 2023-10-19 NOTE — Progress Notes
 Pharmacy Benefits Investigation    Medication name: everolimus (immunosuppressive) (ZORTRESS) 0.5 mg tablet  Medication status: new  Medication regimen: Take one tablet by mouth twice daily.    The insurance does not require a prior authorization for the medication.    The out of pocket cost today is $15 for 30 days. This cost may change due to factors including but not limited to changes in insurance coverage.    Copay assistance is not available. All available forms of copay assistance were evaluated and none are currently available for Jacqueline Shields.    Contacted Jacqueline Shields to discuss the approval and copay. Unable to leave voicemail asking patient to return call to the specialty pharmacy billing team at [phone]. Will follow up in 2 business days if patient has not returned call.  Vito Backers  Specialty Pharmacy Patient Advocate

## 2023-10-20 ENCOUNTER — Encounter: Admit: 2023-10-20 | Discharge: 2023-10-20 | Payer: Private Health Insurance - Indemnity

## 2023-10-23 ENCOUNTER — Encounter: Admit: 2023-10-23 | Discharge: 2023-10-23 | Payer: Private Health Insurance - Indemnity

## 2023-10-23 NOTE — Progress Notes
 Completed check for appeal materials task Everolimous prescription is now able to be filled.    Vito Backers  Specialty Pharmacy  Patient Advocate

## 2023-10-27 ENCOUNTER — Encounter: Admit: 2023-10-27 | Discharge: 2023-10-27 | Payer: Private Health Insurance - Indemnity

## 2023-10-27 ENCOUNTER — Ambulatory Visit: Admit: 2023-10-27 | Discharge: 2023-10-28

## 2023-10-30 ENCOUNTER — Encounter: Admit: 2023-10-30 | Discharge: 2023-10-30

## 2023-10-31 NOTE — Progress Notes
 LOV 10/03/23 TTNP - 68mo f/u  - PCNU removed 09/22/2023, dilation of transplant ureteral stricture and placement of ureteral stent done on 09/22/2023.  Patient reports urinary incontinence since this procedure.  Message sent to Dr. Perlie Gold for follow-up.     - PET scan on 06/20/2023: Findings compatible with reactive lymph nodes with less consideration for pathological process or PTLD    IS: labs every month  Prograf 1 mg bid    10/03/23 DSA  Beads I Negative: No donor specific antibody is present   Beads II Negative: No donor specific antibody is present      08/26/23 DSA  Beads I Negative: No donor specific antibody is present   Beads II Negative: No donor specific antibody is present      06/20/23 DSA  Beads I Negative: No donor specific antibody is present   Beads II Positive: Weak DSA to DR53 MFI=1,000     04/14/23 DSA  Beads I Negative: No donor specific antibody is present   Beads II Positive: Weak DSA to DR53 MFI=1,000     Health Maintenance:  Pap smear: no longer does  Mammogram: April 2024  Colonoscopy: UTD, 04/17/23 (recc repeat 09/2023, Upper endo completed but not lower)  Dermatology: Per PCP, needs annual skin cancer surveillance due to long-term immunosuppression     Visit:   Not checking BP at home  CGM 69 % TIR  C/o continued incontinence since stent placement, f/u 4/17  Diarrhea has improved  PET scan reviewed with patient   Discussed need to repeat colonoscopy, need to send message to GI    Restart everolimus 1 mg bid, goal 4-5    Tacro goal would be set to 4-5      Karnofsky score: Working full time

## 2023-11-01 ENCOUNTER — Encounter: Admit: 2023-11-01 | Discharge: 2023-11-01

## 2023-11-01 ENCOUNTER — Ambulatory Visit: Admit: 2023-11-01 | Discharge: 2023-11-02

## 2023-11-01 MED ORDER — EVEROLIMUS (IMMUNOSUPPRESSIVE) 0.5 MG PO TAB
1 mg | ORAL_TABLET | Freq: Two times a day (BID) | ORAL | 3 refills | 30.00000 days | Status: AC
Start: 2023-11-01 — End: ?
  Filled 2023-11-23: qty 60, 15d supply, fill #1

## 2023-11-01 NOTE — Progress Notes
 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 On PD Jan 2019  DDRT  KPDI 92%, cPRA 0%  Physical Crossmatch B cell negative T cell negative  CMV D -/ R -  Induction: Thymo  Maintenance IS: Dual  IS   PD not removed with surgery   Post-op Complications:  - None    Referring Nephrologist:  Jacqueline Shields  [address]  Jacqueline Shields   [place] [address]  Phone: [phone]  Fax: [phone]     Dear Dr. Elmer Shields,    We had the pleasure of meeting Jacqueline Shields in the  Renal Transplant Clinic for routine evaluation of her renal transplant.  She is alone today.   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  Has not followed up with colonoscopy- diarrhea is much better. She remains off Myfortic due to colitis noted on colon biopsy.  Last PET scan showed improving lymhnode - but not resolved.   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    REVIEW OF SYSTEMS: Comprehensive 14-point ROS reviewed  Positives noted in HPI otherwise negative.    Past History:  Past Medical History:    Abnormal biopsy of kidney    Anemia    Chronic kidney disease    Coronary artery disease due to lipid rich plaque    DM (diabetes mellitus), type 2 (CMS-HCC)    ESRD (end stage renal disease) (CMS-HCC)    Gastroparesis    GERD (gastroesophageal reflux disease)    H/O kidney transplant    HLD (hyperlipidemia)    Hypertension    Kidney failure    Kidney stones    MGUS (monoclonal gammopathy of unknown significance)    Obesity    Proteinuria    PUD (peptic ulcer disease)    PVD (peripheral vascular disease)       Surgical History:   Procedure Laterality Date    ANGIOGRAPHY CORONARY ARTERY WITH LEFT HEART CATHETERIZATION N/A 07/03/2020 Performed by Jacqueline Alto, MD at [facility] CATH LAB    POSSIBLE PERCUTANEOUS CORONARY STENT PLACEMENT WITH ANGIOPLASTY N/A 07/03/2020    Performed by Jacqueline Alto, MD at [facility] CATH LAB    ALLOTRANSPLANTATION KIDNEY FROM NON LIVING DONOR WITHOUT RECIPIENT NEPHRECTOMY N/A 07/15/2020    Performed by Jacqueline Johns, MD at [facility] OR    REMOVAL TUNNELED INTRAPERITONEAL CATHETER Bilateral 08/24/2020    Performed by Jacqueline Johns, MD at [facility] OR    CYSTOURETHROSCOPY WITH REMOVAL FOREIGN BODY/ CALCULUS/ STENT FROM URETHRA/ BLADDER - SIMPLE Right 08/24/2020    Performed by Jacqueline Ponto, MD at [facility] OR    ESOPHAGOGASTRODUODENOSCOPY WITH BIOPSY - FLEXIBLE N/A 04/17/2023    Performed by Jacqueline Eisenmenger, MD at [facility] ENDO    COLONOSCOPY WITH BIOPSY - FLEXIBLE N/A 04/17/2023    Performed by Jacqueline Eisenmenger, MD at [facility] ENDO    ESOPHAGOGASTRODUODENOSCOPY WITH SPECIMEN COLLECTION BY BRUSHING/ WASHING N/A 07/21/2023    Performed by Jacqueline Cockayne, MD at [facility] ENDO    ANTEGRADE UROGRAPHY Right 09/22/2023    Performed by Jacqueline Cluster, MD at [facility] OR    RETROGRADE UROGRAPHY WITH/ WITHOUT KUB Right 09/22/2023    Performed by Jacqueline Cluster,  MD at [facility] OR    REMOVAL, NEPHROSTOMY TUBE, WITH FLUOROSCOPIC GUIDANCE Right 09/22/2023    Performed by Jacqueline Cluster, MD at [facility] OR    CYSTOURETHROSCOPY WITH URETEROSCOPY WITH TREATMENT URETERAL STRICTURE Right 09/22/2023    Performed by Jacqueline Cluster, MD at [facility] OR    CATHETER IMPLANT/REVISION      PD cath    CESAREAN SECTION  1991    HX HYSTERECTOMY      still has ovaries    HX LITHOTRIPSY      HX TUBAL LIGATION  1995       Social History     Socioeconomic History    Marital status: Married     Spouse name: Jacqueline Shields    Number of children: 2   Tobacco Use    Smoking status: Never    Smokeless tobacco: Never   Vaping Use    Vaping status: Never Used   Substance and Sexual Activity    Alcohol use: Not Currently    Drug use: Never    Sexual activity: Not Currently     Partners: Male   Social History [place]arrative [place] Works at [employer], puts labels on bottles.       Family History   Problem Relation Name Age of Onset    Diabetes Jacqueline Shields     Hypertension Jacqueline Shields     Diabetes Father Jacqueline Shields     Hypertension Father Jacqueline Shields     Cancer-Hematologic Father Jacqueline Shields     Cancer Father Jacqueline Shields     Diabetes Sister Jacqueline Shields     Migraines Sister Jacqueline Shields     Cancer Sister Jacqueline Shields         Endometrial       No Known Allergies    Current Medications:    Current Outpatient Medications:     aspirin EC (ASPIR-LOW) 81 mg tablet, Take one tablet by mouth daily. Take with food., Disp: 90 tablet, Rfl: 3    blood sugar diagnostic (ONETOUCH VERIO TEST STRIPS) test strip, Use one strip as directed before meals and at bedtime. ICD-10: Type II Diabetes with unspecified complications; with long term insulin use E11.8, Z79.4  Indications: type 2 diabetes mellitus, Disp: 300 strip, Rfl: 3    CALCIUM PO, Take 1 tablet by mouth daily., Disp: , Rfl:     CHOLEcalciferoL (vitamin D3) 50 mcg (2,000 unit) capsule, TAKE 1 CAPSULE BY MOUTH EVERY DAY, Disp: 90 capsule, Rfl: 3    DEXCOM G7 SENSOR sensor device, Use one each as directed every 10 days. Indications: type 2 diabetes mellitus, Disp: 9 each, Rfl: 3    everolimus (immunosuppressive) (ZORTRESS) 0.5 mg tablet, Take two tablets by mouth twice daily. Indications: prevent kidney transplant rejection, Z94.0, Disp: 360 tablet, Rfl: 3    ferrous sulfate (FEOSOL) 325 mg (65 mg iron) tablet, Take one tablet by mouth at bedtime daily. Take on an empty stomach at least 1 hour before or 2 hours after food., Disp: 30 tablet, Rfl: 3    glucose (DEX4 GLUCOSE) 4 gram chewable tablet, Chew four tablets by mouth as Needed. Indications: low blood sugar (Patient not taking: Reported on 11/01/2023), Disp: 50 tablet, Rfl: 0    insulin aspart (U-100) (NOVOLOG FLEXPEN U-100 INSULIN) 100 unit/mL (3 mL) PEN, Inject 10 units subcutaneously with breakfast, 8 units with lunch, and 10 units with evening meal PLUS sliding scale If blood sugar 151-200: Take extra 2 unit. If blood sugar 201-250: Take extra 4 units. If  blood sugar 251-300: Take extra 6 units. If blood sugar is 301-350: Take extra 8 units. If blood sugars > 350: Take extra 10 units. Max daily dose 45 units.  Indications: type 2 diabetes mellitus, Disp: 45 mL, Rfl: 1    insulin degludec (TRESIBA FLEXTOUCH U-100) 100 unit/mL (3 mL) subcutaneous PEN, Inject sixty Units under the skin at bedtime daily., Disp: , Rfl:     insulin glargine (LANTUS SOLOSTAR U-100 INSULIN) 100 unit/mL (3 mL) subcutaneous PEN, Inject thirty Units under the skin at bedtime daily. Indications: type 2 diabetes mellitus (Patient not taking: Reported on 11/01/2023), Disp: 45 mL, Rfl: 0    lancets 33 gauge (ONETOUCH DELICA 33 GUAGE) 33 gauge, Use one each as directed four times daily. ICD-10: Type II Diabetes with unspecified complications; with long term insulin use E11.8, Z79.4, Disp: 300 each, Rfl: 3    pen needle, diabetic (BD ULTRA-FINE NANO PEN NEEDLE) 32 gauge x 5/32 pen needle, Use one each as directed as Needed. Use with insulin injections., Disp: 300 each, Rfl: 3    rosuvastatin (CRESTOR) 20 mg tablet, Take one tablet by mouth daily., Disp: 90 tablet, Rfl: 1    tacrolimus (PROGRAF) 1 mg capsule, Take one capsule by mouth twice daily. Indications: prevent kidney transplant rejection, Disp: 180 capsule, Rfl: 3    tamsulosin (FLOMAX) 0.4 mg capsule, Take one capsule by mouth daily. Do not crush, chew or open capsules. Take 30 minutes following the same meal each day.  Indications: stent, Disp: 60 capsule, Rfl: 0    tirzepatide (MOUNJARO) 5 mg/0.5 mL injector EN, Inject 0.5 mL under the skin every 7 days. (Patient taking differently: Inject 0.5 mL under the skin every 7 days. Saturdays), Disp: 6 mL, Rfl: 3    Physical Exam:  Vitals:    11/01/23 1103 11/01/23 1104   BP: 118/79 113/63   BP Source: Arm, Right Upper Arm, Right Upper   Pulse: 81 87   Temp: 36.4 ?C (97.5 ?F)    SpO2: 100%    TempSrc: Oral    PainSc: Zero    Weight: 98.1 kg (216 lb 3.2 oz)    Height: 172.7 cm (5' 8)    Body mass index is 32.87 kg/m?Marland Kitchen   General: NAD, A+Ox4, calm and pleasant. Appears to be stated age.   HENT: Unremarkable, no oral lesions  Neck: Normal ROM, no LAD, no JVD  Lungs: Bilat. CTA  CV: RRR, S1, S2 without carotid bruit or murmur, no edema  Abdomen: Soft, N/D, N/T without hepatosplenomegaly  Incision: healed  M/S: Normal ROM and strength  Neuro: Nonfocal deficits without tremors  Skin: No skin rash  Psyc: Stable  Laboratory studies:     CMP:      Latest Ref Rng & Units 10/27/2023     2:48 PM 10/03/2023    12:08 PM 08/26/2023    11:22 AM 08/12/2023    10:39 AM 07/24/2023     1:37 PM   CMP   Sodium 137 - 147 mmol/L 142  144  144  144  143    Potassium 3.5 - 5.1 mmol/L 4.4  4.0  4.0  4.5  4.0    Chloride 98 - 110 mmol/L 110  111  108  111  108    CO2 21 - 30 mmol/L 22  24  26  23  24     Anion Gap 3 - 12 10  9  10  10  11     Blood Urea Nitrogen 7 -  25 mg/dL 30  45  42  51  38    Creatinine 0.40 - 1.00 mg/dL 1.61  0.96  0.45  4.09  2.96    Glucose 70 - 100 mg/dL 72  71  811  914  782    Calcium 8.5 - 10.6 mg/dL 8.4  8.7  9.0  8.9  9.3    Total Protein 6.0 - 8.0 g/dL 7.6  7.7  7.5   7.8    Albumin 3.5 - 5.0 g/dL 3.7  4.1  3.9   4.1    Alk Phosphatase 25 - 110 U/L 76  64  61   76    ALT (SGPT) 7 - 56 U/L 22  10  11   11     AST 7 - 40 U/L 21  16  14   12     Total Bilirubin 0.2 - 1.3 mg/dL 0.5  0.5  0.4   0.5    GFR >60 mL/min 16  17  17  14  18       Hemoglobin A1C (%)   Date Value   04/13/2023 11.1 (H)   10/30/2021 11.6 (H)   05/29/2021 10.8 (H)     PTH Hormone   Date Value   10/03/2023 116.6 pg/mL (H)   07/24/2023 104.0 pg/mL (H)   04/25/2023 151.5 PG/ML (H)   10/30/2021 195.9 PG/ML (H)     No results found for: LIPASE  No results found for: AMY  BK Virus Plasma Quant (no units)   Date Value   04/14/2023 BK Virus Not Detected   10/30/2021     NOT DETECTED  Reference range: NOT DETECTED  Unit: IU/mL  .  Assay Range: 33 IU/mL to 3.30E+08 IU/mL  .  One IU is equal to 0.33 copies of BKV.  .  The limit of quantitation (LOQ) is 33 IU/mL. BK virus DNA detected  below the LOQ  will be reported as Detected:<33 IU/mL.  .  This test was developed and its performance characteristics  determined by  [facility]. It has not been cleared or approved by the U.S.  Food and Drug  Administration. Results should be used in conjunction with clinical  findings,  and should not form the sole basis for a diagnosis or treatment  decision.  ____________________________________________________________  Testing Performed At:  [facility]  [address]  [place], [place] [address]  Laboratory Director: Lind Guest Ph.D., BCLD (ABB)  CLIA#: [id number]  Phone: [phone]     10/02/2021     NOT DETECTED  Reference range: NOT DETECTED  Unit: IU/mL  TESTING PERFORMED AT LOW VOLUME ON PLASMA SPECIMEN FOR BKV qPCR, MAY  AFFECT  RESULTS.  Marland Kitchen  Assay Range: 33 IU/mL to 3.30E+08 IU/mL  .  One IU is equal to 0.33 copies of BKV.  .  The limit of quantitation (LOQ) is 33 IU/mL. BK virus DNA detected  below the LOQ  will be reported as Detected:<33 IU/mL.  .  This test was developed and its performance characteristics  determined by  [facility]. It has not been cleared or approved by the U.S.  Food and Drug  Administration. Results should be used in conjunction with clinical  findings,  and should not form the sole basis for a diagnosis or treatment  decision.  ____________________________________________________________  Testing Performed At:  [facility]  [address]  [place], [place] [address]  Laboratory Director: Lind Guest Ph.D., BCLD (ABB)  CLIA#: [phone]  Phone: [phone]  09/04/2021     NOT DETECTED  Reference range: NOT DETECTED  Unit: IU/mL  .  Assay Range: 33 IU/mL to 3.30E+08 IU/mL  .  One IU is equal to 0.33 copies of BKV.  .  The limit of quantitation (LOQ) is 33 IU/mL. BK virus DNA detected  below the LOQ  will be reported as Detected:<33 IU/mL.  .  This test was developed and its performance characteristics  determined by  [facility]. It has not been cleared or approved by the U.S.  Food and Drug  Administration. Results should be used in conjunction with clinical  findings,  and should not form the sole basis for a diagnosis or treatment  decision.  ____________________________________________________________  Testing Performed At:  [facility]  [address]  [place], [place] [address]  Laboratory Director: Lind Guest Ph.D., BCLD (ABB)  CLIA#: [id number]  Phone: [phone]     08/03/2021     NOT DETECTED  Reference range: NOT DETECTED  Unit: IU/mL  .  Assay Range: 33 IU/mL to 3.30E+08 IU/mL  .  One IU is equal to 0.33 copies of BKV.  .  The limit of quantitation (LOQ) is 33 IU/mL. BK virus DNA detected  below the LOQ  will be reported as Detected:<33 IU/mL.  .  This test was developed and its performance characteristics  determined by  [facility]. It has not been cleared or approved by the U.S.  Food and Drug  Administration. Results should be used in conjunction with clinical  findings,  and should not form the sole basis for a diagnosis or treatment  decision.  ____________________________________________________________  Testing Performed At:  [facility]  [address]  [place], [place] [address]  Laboratory Director: Lind Guest Ph.D., BCLD (ABB)  CLIA#: [id number]  Phone: [phone]       BK Virus Plasma (no units)   Date Value   10/27/2023 Not Detected   10/03/2023 Not Detected   08/26/2023 Not Detected   07/24/2023 Not Detected   06/20/2023 Not Detected     CMV DNA Quant PCR ([IU]/mL)   Date Value   04/14/2023 CMV DNA NOT DETECTED   12/04/2021 CMV DNA NOT DETECTED   10/30/2021 CMV DNA NOT DETECTED   10/02/2021 CMV DNA NOT DETECTED   09/04/2021 CMV DNA NOT DETECTED     IU/mL CMV Blood (no units)   Date Value   12/04/2021     <50 IU/mL  The test method detects and quantitates CMV DNA using the Abbott RealTime assay,   and is approved by the FDA for monitoring hematopoietic stem cell transplant   patients who are undergoing anti-CMV therapy.  Please correlate results with the   clinical status of the patient.     10/30/2021     <50 IU/mL  The test method detects and quantitates CMV DNA using the Abbott RealTime assay,   and is approved by the FDA for monitoring hematopoietic stem cell transplant   patients who are undergoing anti-CMV therapy.  Please correlate results with the   clinical status of the patient.     10/02/2021     <50 IU/mL  The test method detects and quantitates CMV DNA using the Abbott RealTime assay,   and is approved by the FDA for monitoring hematopoietic stem cell transplant   patients who are undergoing anti-CMV therapy.  Please correlate results with the   clinical status of the patient.     09/04/2021     <50 IU/mL  The test method detects and quantitates CMV DNA using the Abbott RealTime assay,   and is approved by the FDA for monitoring hematopoietic stem cell transplant   patients who are undergoing anti-CMV therapy.  Please correlate results with the   clinical status of the patient.     08/03/2021     <50 IU/mL  The test method detects and quantitates CMV DNA using the Abbott RealTime assay,   and is approved by the FDA for monitoring hematopoietic stem cell transplant   patients who are undergoing anti-CMV therapy.  Please correlate results with the   clinical status of the patient.       No results found for: COPIES  EBV DNA, Quant. (no units)   Date Value   04/14/2023 EBV DNA Not Detected       TACROLIMUS LEVEL:  Tacrolimus Immunoassay (ng/mL)   Date Value   10/27/2023 5.0   10/03/2023 9.3   08/26/2023 5.1   07/24/2023 6.1   06/20/2023 11.4   04/25/2023 7.3   04/21/2023 5.1   04/20/2023 6.1   04/19/2023 5.7   04/18/2023 4.9 (L)   04/17/2023 5.1   04/16/2023 10.0   04/15/2023 8.7       CBC with Diff:      Latest Ref Rng & Units 10/27/2023     2:48 PM 10/03/2023    12:08 PM   CBC with Diff   WBC 4.50 - 11.00 10*3/uL 6.70  9.70    RBC 4.00 - 5.00 10*6/uL 3.84  4.34    Hemoglobin 12.0 - 15.0 g/dL 16.1  09.6    Hematocrit 36.0 - 45.0 % 32.8  36.4    MCV 80.0 - 100.0 fL 85.6  83.7    MCH 26.0 - 34.0 pg 28.2  28.1    MCHC 32.0 - 36.0 g/dL 04.5  40.9    RDW 81.1 - 15.0 % 15.8  15.3    Platelet Count 150 - 400 10*3/uL 193  229    MPV 7.0 - 11.0 fL 9.3  8.8    Neurtrophils 41.0 - 77.0 % 68.8  81.7    Absolute Neutrophils 1.80 - 7.00 10*3/uL 4.60  7.90    Absolute Lymph Count 1.00 - 4.80 10*3/uL 1.20  1.00    Absolute Monocyte Count 0.00 - 0.80 10*3/uL 0.60  0.60    Eosinophils 0.0 - 5.0 % 2.9  1.6    Absolute Eosinophil Count 0.00 - 0.45 10*3/uL 0.20  0.20    Basophils 0.0 - 2.0 % 0.7  0.6      Lab Results   Component Value Date/Time    IRON 50 04/15/2023 05:28 AM    TIBC 216 (L) 04/15/2023 05:28 AM    PSAT 23 (L) 04/15/2023 05:28 AM    FERRITIN 614 (H) 04/15/2023 05:28 AM    FERRITIN 449 (H) 10/30/2021 11:27 AM       Urinalysis:  Lab Results   Component Value Date/Time    UCOLOR Yellow 10/03/2023 01:50 PM    TURBID 2+ (A) 10/03/2023 01:50 PM    USPGR 1.013 10/03/2023 01:50 PM    UPH 6.0 10/03/2023 01:50 PM    UPROTEIN 3+ (A) 10/03/2023 01:50 PM    UAGLU Negative 10/03/2023 01:50 PM    UKET Negative 10/03/2023 01:50 PM    UBILE Negative 10/03/2023 01:50 PM    UBLD 1+ (A) 10/03/2023 01:50 PM    UROB Normal 10/03/2023 01:50 PM     Protein/CR ratio (no units)  Date Value   10/03/2023 1.23 (H)   08/26/2023 0.96 (H)   07/24/2023 0.94 (H)   04/13/2023 1.7 (H)   01/08/2022 0.7 (H)   12/04/2021 0.7 (H)   10/30/2021 0.7 (H)   10/02/2021 0.5 (H)       Imaging:  Results for orders placed during the hospital encounter of 04/13/23    CT ABD/PELV WO CONTRAST    Impression  1. Right lower quadrant renal transplant. Persistent hydronephrosis. Perinephric and periureteral stranding is nonspecific and may be from infection or inflammation.    2. Decompressed urinary bladder about a Foley catheter. Small gas in the bladder lumen, likely from the presence of the catheter. Diffuse bladder wall thickening and perivesicular stranding, nonspecific but may be from cystitis.    3. Small to upper limits normal size retroperitoneal and pelvic lymph nodes, indeterminate but may be reactive. Early lymphoproliferative process such as post transplant lymphoproliferative disorder could potentially have this appearance. Short interval follow-up CT abdomen pelvis in 3 months recommended to evaluate for stability.    4. Mild native renal atrophy. Subcentimeter nonobstructing right renal calculi.      Finalized by Ancil Boozer, M.D. on 04/18/2023 3:18 PM. Dictated by Ancil Boozer, M.D. on 04/18/2023 3:09 PM.    Results for orders placed during the hospital encounter of 04/13/23    CHEST 2 VIEWS    Impression  No acute cardiopulmonary abnormality.      Finalized by Golda Acre, M.D. on 04/14/2023 6:57 AM. Dictated by Golda Acre, M.D. on 04/14/2023 6:56 AM.    Assessment and Plan:    Ms. 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 volume depletion, hydronephrosis/hydroureter.  PCNU removed 09/22/2023, dilation of transplant ureteral stricture and placement of ureteral stent - Requiring frequent exchanges.     - PET scan on 06/20/2023: Findings compatible with reactive lymph nodes with less consideration for pathological process or PTLD- Likely colonic issues- will need to follow up with GI      # Immunosuppression: Monotherapy maintenance therapy (previously dual therapy)  10/03/23 DSA  Beads I Negative: No donor specific antibody is present   Beads II Negative: No donor specific antibody is present       08/26/23 DSA  Beads I Negative: No donor specific antibody is present   Beads II Negative: No donor specific antibody is present       06/20/23 DSA  Beads I Negative: No donor specific antibody is present   Beads II Positive: Weak DSA to DR53 MFI=1,000     04/14/23 DSA  Beads I Negative: No donor specific antibody is present   Beads II Positive: Weak DSA to DR53 MFI=1,000      -Tacrolimus trough goal 6-8, pending today  - Remains off MPA, 04/17/2023 colon biopsy shows colitis rare:  Cells positive for CMV on colonic biopsy  - off prednisone for now,   - Start Everolimus 1mg  BID, goal ~5      #. HTN:   Not on antihypertensive medications     # ID:  - Rare staining positive for CMV in colon biopsy,   - No longer on valcyte.     #) Unintentional weight loss   - now stabilized     # Heme  - Hb 10.8     # DM-fair control  -Going follow-up with endocrinology  - Average glucose 1 70-1 80  - TIR: 55%  - Endo on board. Recent A1c 11%      # HLD  - on Crestor    # MGUS, IgG kappa  Ongoing follow-up with hematology  FLC ratio 1.88  Serum M 0.62    # CAD s/p cath 07/03/20: superior branch LAD 80% disease, RCA 40-50% mid RCA stenosis at/around origin RV marginal branch; RV marginal branch also has ~80% stenosis; guide directed medical therapy, risk factor optimization  -On aspirin, statin, not on beta-blocker      #) Health maintenance by PCP  Pap smear: no longer does  Mammogram: April 2024  Colonoscopy: UTD, 04/17/23 (recc repeat 09/2023, Upper endo completed but not lower)  Dermatology: Per PCP, needs annual skin cancer surveillance due to long-term immunosuppression RTC 4 months  Labs monthly      Corena Herter, MD     Cc: Jacqueline Shields  Cc: Leron Croak    Please contact the [facility] at [phone] for any transplant related questions or concerns that may arise.

## 2023-11-02 ENCOUNTER — Encounter: Admit: 2023-11-02 | Discharge: 2023-11-02

## 2023-11-02 DIAGNOSIS — Z79899 Other long term (current) drug therapy: Secondary | ICD-10-CM

## 2023-11-02 DIAGNOSIS — Z940 Kidney transplant status: Secondary | ICD-10-CM

## 2023-11-02 DIAGNOSIS — D849 Immunodeficiency, unspecified: Secondary | ICD-10-CM

## 2023-11-03 ENCOUNTER — Encounter: Admit: 2023-11-03 | Discharge: 2023-11-03

## 2023-11-06 ENCOUNTER — Encounter: Admit: 2023-11-06 | Discharge: 2023-11-06

## 2023-11-07 ENCOUNTER — Encounter: Admit: 2023-11-07 | Discharge: 2023-11-07

## 2023-11-07 NOTE — Progress Notes
 Pharmacy Benefits Investigation    Medication name: everolimus (immunosuppressive) (ZORTRESS) 0.5 mg tablet  Medication status: new  Medication regimen: Take two tablets by mouth twice daily.    The insurance requires a prior authorization for the medication. The prior authorization was submitted via [employer]. Will follow up in 2 business days.    EOC ID: [id number]    Jacqueline Shields  Specialty Pharmacy Patient Advocate

## 2023-11-08 ENCOUNTER — Encounter: Admit: 2023-11-08 | Discharge: 2023-11-08

## 2023-11-08 NOTE — Progress Notes
 Pharmacy Benefits Investigation    Medication name: everolimus (immunosuppressive) (ZORTRESS) 0.5 mg tablet  Medication status: new  Medication regimen: Take two tablets by mouth twice daily.    Appeal level: first    The prior authorization was previously denied by insurance. An appeal with a letter of medical necessity was submitted to insurance to obtain the prescribed medication and dose.    The appeal was denied by insurance. Insurance provided the following reasons for the denial: Prior authorization requirements have not been met.. The clinic was notified.    If an alternative therapy is not appropriate and the prescribed medication and dose are necessary, the insurance may accept a second level appeal. This will require a letter of medical necessity. The clinic was notified. To proceed with a second level appeal, completed appeal materials should be returned to the specialty pharmacy team. Will follow up in 7 business days if no response. Please contact the specialty pharmacy with any questions regarding next steps.    The requested medication may only be considered for approval if the patient meets both of the following (A and B): (A) The patient has an FDA approved indication for use (will patient be using in combination with basiliximab induction, cyclosporine [reduced dose required] and a corticosteroid); and (B) the requested medication is to be used at a dose that is in accordance with the FDA-approved indication for use.   Calvin Caulk  Specialty Pharmacy Patient Advocate

## 2023-11-10 ENCOUNTER — Encounter: Admit: 2023-11-10 | Discharge: 2023-11-10 | Payer: PRIVATE HEALTH INSURANCE

## 2023-11-17 ENCOUNTER — Encounter: Admit: 2023-11-17 | Discharge: 2023-11-17 | Payer: PRIVATE HEALTH INSURANCE

## 2023-11-17 ENCOUNTER — Ambulatory Visit: Admit: 2023-11-17 | Discharge: 2023-11-17 | Payer: PRIVATE HEALTH INSURANCE

## 2023-11-17 MED ORDER — LIDOCAINE (PF) 20 MG/ML (2 %) IJ SOLN
INTRAVENOUS | 0 refills | Status: DC
Start: 2023-11-17 — End: 2023-11-17

## 2023-11-17 MED ORDER — ONDANSETRON HCL (PF) 4 MG/2 ML IJ SOLN
INTRAVENOUS | 0 refills | Status: DC
Start: 2023-11-17 — End: 2023-11-17

## 2023-11-17 MED ORDER — PROPOFOL INJ 10 MG/ML IV VIAL
INTRAVENOUS | 0 refills | Status: DC
Start: 2023-11-17 — End: 2023-11-17

## 2023-11-17 MED ORDER — PHENYLEPHRINE HCL IN 0.9% NACL 1 MG/10 ML (100 MCG/ML) IV SYRG
INTRAVENOUS | 0 refills | Status: DC
Start: 2023-11-17 — End: 2023-11-17

## 2023-11-17 MED ORDER — FENTANYL CITRATE (PF) 50 MCG/ML IJ SOLN
INTRAVENOUS | 0 refills | Status: DC
Start: 2023-11-17 — End: 2023-11-17

## 2023-11-17 MED ORDER — MIDAZOLAM 1 MG/ML IJ SOLN
INTRAVENOUS | 0 refills | Status: DC
Start: 2023-11-17 — End: 2023-11-17

## 2023-11-17 MED ORDER — PROPOFOL 10 MG/ML IV EMUL 100 ML (INFUSION)(AM)(OR)
INTRAVENOUS | 0 refills | Status: DC
Start: 2023-11-17 — End: 2023-11-17
  Administered 2023-11-17: 17:00:00 80 ug/kg/min via INTRAVENOUS

## 2023-11-17 MED ORDER — DEXMEDETOMIDINE IN 0.9 % NACL 20 MCG/5 ML (4 MCG/ML) IV SYRG
INTRAVENOUS | 0 refills | Status: DC
Start: 2023-11-17 — End: 2023-11-17

## 2023-11-17 MED ADMIN — LEVOFLOXACIN IN D5W 500 MG/100 ML IV PGBK [87854]: 500 mg | INTRAVENOUS | @ 14:00:00 | Stop: 2023-11-17 | NDC 44567043624

## 2023-11-17 MED ADMIN — SODIUM CHLORIDE 0.9% IV SOLP [27838]: 1000 mL | INTRAVENOUS | @ 14:00:00 | Stop: 2023-11-17 | NDC 00338004904

## 2023-11-17 NOTE — Progress Notes
 Pharmacy Benefits Investigation    Medication name: everolimus  (immunosuppressive) (ZORTRESS ) 0.5 mg tablet  Medication status: new  Medication regimen: Take two tablets by mouth twice daily.    Appeal level: first    The appeal was approved for Jacqueline Shields from 11/17/2023 through 11/16/2024. The authorization number is [id number]. The clinic was notified of the approval. The clinic pharmacist was notified of the approval.    PA has been approved but quantity approved is 60 for 30. This dosage amount is incorrect.Will need to submit an amended letter of medical necessity that includes the correct quantity and day supply to be dispensed to patient. The letter should include the Dr's name and signature and can be faxed to [phone] and marked urgent for 24 hr turn around. EOC: [id number]. Our appeal letter can be amended to include this information.    Calvin Caulk  Specialty Pharmacy Patient Advocate

## 2023-11-19 ENCOUNTER — Encounter: Admit: 2023-11-19 | Discharge: 2023-11-19 | Payer: PRIVATE HEALTH INSURANCE

## 2023-11-20 ENCOUNTER — Encounter: Admit: 2023-11-20 | Discharge: 2023-11-20 | Payer: PRIVATE HEALTH INSURANCE

## 2023-11-21 ENCOUNTER — Encounter: Admit: 2023-11-21 | Discharge: 2023-11-21 | Payer: PRIVATE HEALTH INSURANCE

## 2023-11-21 NOTE — Progress Notes
 Pharmacy Benefits Investigation    Medication name: everolimus  (immunosuppressive) (ZORTRESS ) 0.5 mg tablet  Medication status: new  Medication regimen: Take two tablets by mouth twice daily.    The out of pocket cost today is $15 for 15 days. This cost may change due to factors including but not limited to changes in insurance coverage.    Copay assistance is not available. All available forms of copay assistance were evaluated and none are currently available for Jacqueline Shields.    Contacted Hollyann L Hodge to discuss the approval and copay. Left voicemail asking patient to return call to the specialty pharmacy billing team at [phone]. Will follow up in 2 business days if patient has not returned call.    No appeal will be filed at this time per clinic unless a dose adjustment is needed in the future.  Calvin Caulk  Specialty Pharmacy Patient Advocate

## 2023-11-22 ENCOUNTER — Encounter: Admit: 2023-11-22 | Discharge: 2023-11-22 | Payer: PRIVATE HEALTH INSURANCE

## 2023-11-22 NOTE — Telephone Encounter
 Pt LVM stating that she missed a call. Called and LVM to pt to notify that specialty pharmacy billing department had called her and requested that she call their department back at [phone].

## 2023-11-23 ENCOUNTER — Encounter: Admit: 2023-11-23 | Discharge: 2023-11-23 | Payer: PRIVATE HEALTH INSURANCE

## 2023-11-24 ENCOUNTER — Encounter: Admit: 2023-11-24 | Discharge: 2023-11-24 | Payer: PRIVATE HEALTH INSURANCE

## 2023-11-27 ENCOUNTER — Encounter: Admit: 2023-11-27 | Discharge: 2023-11-27 | Payer: PRIVATE HEALTH INSURANCE

## 2023-11-28 ENCOUNTER — Encounter: Admit: 2023-11-28 | Discharge: 2023-11-28 | Payer: PRIVATE HEALTH INSURANCE

## 2023-12-02 ENCOUNTER — Encounter: Admit: 2023-12-02 | Discharge: 2023-12-02 | Payer: PRIVATE HEALTH INSURANCE

## 2023-12-02 ENCOUNTER — Ambulatory Visit: Admit: 2023-12-02 | Discharge: 2023-12-03 | Payer: PRIVATE HEALTH INSURANCE

## 2023-12-04 ENCOUNTER — Encounter: Admit: 2023-12-04 | Discharge: 2023-12-04 | Payer: PRIVATE HEALTH INSURANCE

## 2023-12-05 ENCOUNTER — Encounter: Admit: 2023-12-05 | Discharge: 2023-12-05 | Payer: PRIVATE HEALTH INSURANCE

## 2023-12-06 ENCOUNTER — Encounter: Admit: 2023-12-06 | Discharge: 2023-12-06 | Payer: PRIVATE HEALTH INSURANCE

## 2023-12-06 NOTE — Progress Notes
 Pharmacy Benefits Investigation    Medication name: everolimus (immunosuppressive) (ZORTRESS) 0.5 mg tablet  Medication status: continuation (refill)  Medication regimen: Take two tablets by mouth twice daily.    Good morning,    It appears we may need to resume getting a Prior Auth approved for the correct quantity for patients everolimus. The plan limit is a max of 60 in 23 days and we are currently filling 60 tablets for 15 days. There would be a lapse of about 8 days in therapy before I can fill this script again.    Calvin Caulk   Speciality Pharmacy  Patient Advocate

## 2023-12-07 ENCOUNTER — Encounter: Admit: 2023-12-07 | Discharge: 2023-12-07 | Payer: PRIVATE HEALTH INSURANCE

## 2023-12-08 ENCOUNTER — Encounter: Admit: 2023-12-08 | Discharge: 2023-12-08 | Payer: PRIVATE HEALTH INSURANCE

## 2023-12-08 NOTE — Progress Notes
 Pharmacy Benefits Investigation    Medication name: everolimus (immunosuppressive) (ZORTRESS) 0.5 mg tablet  Medication status: new  Medication regimen: Take two tablets by mouth twice daily.    Appeal level: first    The prior authorization was previously denied by insurance. To obtain the prescribed medication and dose, the insurance requires the decision to be appealed with a letter of medical necessity.    The appeal for Jacqueline Shields has been submitted to the insurance. A decision typically takes up to 30 business days. The specialty pharmacy team will follow up with the insurance to verify they received the materials within 5 business days.    Sent a MyChart message to provide an update. This is the faxed to [phone] EOC: [id number] attempt to contact the patient.  Calvin Caulk  Specialty Pharmacy Patient Advocate

## 2023-12-09 ENCOUNTER — Encounter: Admit: 2023-12-09 | Discharge: 2023-12-09 | Payer: PRIVATE HEALTH INSURANCE

## 2023-12-09 ENCOUNTER — Ambulatory Visit: Admit: 2023-12-09 | Discharge: 2023-12-09 | Payer: PRIVATE HEALTH INSURANCE

## 2023-12-09 ENCOUNTER — Ambulatory Visit: Admit: 2023-12-09 | Discharge: 2023-12-10 | Payer: PRIVATE HEALTH INSURANCE

## 2023-12-11 ENCOUNTER — Encounter: Admit: 2023-12-11 | Discharge: 2023-12-11 | Payer: PRIVATE HEALTH INSURANCE

## 2023-12-13 ENCOUNTER — Encounter: Admit: 2023-12-13 | Discharge: 2023-12-13 | Payer: PRIVATE HEALTH INSURANCE

## 2023-12-13 MED ORDER — EVEROLIMUS (IMMUNOSUPPRESSIVE) 1 MG PO TAB
1 mg | ORAL_TABLET | Freq: Two times a day (BID) | ORAL | 3 refills | 30.00000 days | Status: DC
Start: 2023-12-13 — End: 2023-12-13

## 2023-12-13 MED ORDER — EVEROLIMUS (IMMUNOSUPPRESSIVE) 0.5 MG PO TAB
.5 mg | ORAL_TABLET | Freq: Every evening | ORAL | 3 refills | Status: CN
Start: 2023-12-13 — End: ?

## 2023-12-13 MED ORDER — EVEROLIMUS (IMMUNOSUPPRESSIVE) 1 MG PO TAB
1 mg | ORAL_TABLET | Freq: Every morning | ORAL | 3 refills | 30.00000 days | Status: AC
Start: 2023-12-13 — End: ?
  Filled 2023-12-14 (×2): qty 30, 30d supply, fill #1

## 2023-12-13 NOTE — Telephone Encounter
 LVM for patient to call back nurse coordinator regarding everolimus coverage. Insurance covered 1 mg tablets (quantity of 30 tabs) at a copay of $15. Currently the 0.5 mg tablets are pending a benefits investigation for insurance approval. Prescriptions will be dispensed and shipped from [facility] pharmacy once approved.     Shaaron Dar, PharmD, MBA

## 2023-12-13 NOTE — Progress Notes
 Pharmacy Benefits Investigation    Medication name: everolimus (immunosuppressive) (ZORTRESS) 1 mg tablet  Medication status: continuation (refill)    The insurance requires a prior authorization for the medication. The prior authorization renewal was submitted via [employer]. Will follow up in 2 business days.    EOC ID: [id number]    Jacqueline Shields  Specialty Pharmacy Patient Advocate

## 2023-12-13 NOTE — Progress Notes
 Updated everolimus prescription to have accurate instructions of 1 mg in the morning and 0.5 mg in the evenings. Discussed changes with retail pharmacy in order to urgently process these.     Shaaron Dar, PharmD, MBA

## 2023-12-14 ENCOUNTER — Encounter: Admit: 2023-12-14 | Discharge: 2023-12-14 | Payer: PRIVATE HEALTH INSURANCE

## 2023-12-15 ENCOUNTER — Encounter: Admit: 2023-12-15 | Discharge: 2023-12-15 | Payer: PRIVATE HEALTH INSURANCE

## 2023-12-15 NOTE — Progress Notes
 Pharmacy Benefits Investigation    Medication name: everolimus (immunosuppressive) (ZORTRESS) 1 mg tablet  Medication status: continuation (refill)    The prior authorization renewal was reapproved for Jacqueline Shields (PA number [id number]) from 12/13/2023 through 12/11/2024.    The out of pocket cost today is $15 for 1 month. This cost may change due to factors including but not limited to changes in insurance coverage.    Copay assistance is not available. All available forms of copay assistance were evaluated and none are currently available for Jacqueline Shields.    The patient previously stated this copay is affordable. The patient is not due for a refill at this time, the prescription will be filled through the normal refill process.    Riley Cheadle  Specialty Pharmacy Patient Advocate

## 2023-12-18 ENCOUNTER — Encounter: Admit: 2023-12-18 | Discharge: 2023-12-18 | Payer: PRIVATE HEALTH INSURANCE

## 2023-12-18 MED FILL — TACROLIMUS 1 MG PO CAP: 1 mg | ORAL | 90 days supply | Qty: 180 | Fill #2 | Status: AC

## 2023-12-20 ENCOUNTER — Encounter: Admit: 2023-12-20 | Discharge: 2023-12-20 | Payer: PRIVATE HEALTH INSURANCE

## 2023-12-22 ENCOUNTER — Encounter: Admit: 2023-12-22 | Discharge: 2023-12-22 | Payer: PRIVATE HEALTH INSURANCE

## 2023-12-22 ENCOUNTER — Ambulatory Visit: Admit: 2023-12-22 | Discharge: 2023-12-23 | Payer: PRIVATE HEALTH INSURANCE

## 2023-12-22 ENCOUNTER — Ambulatory Visit: Admit: 2023-12-22 | Discharge: 2023-12-22 | Payer: PRIVATE HEALTH INSURANCE

## 2023-12-26 ENCOUNTER — Encounter: Admit: 2023-12-26 | Discharge: 2023-12-26 | Payer: PRIVATE HEALTH INSURANCE

## 2023-12-26 NOTE — Telephone Encounter
 Labs reviewed from 5/23, blood counts stable, Cr 3.6, within baseline.  Tacrolimus 5.8FK, within goal. Everolimus 4.9, within goal.  UPC 0.8. BK/CMV neg.  Pt updated via My Chart

## 2023-12-29 ENCOUNTER — Encounter: Admit: 2023-12-29 | Discharge: 2023-12-29 | Payer: PRIVATE HEALTH INSURANCE

## 2024-01-01 ENCOUNTER — Encounter: Admit: 2024-01-01 | Discharge: 2024-01-01 | Payer: PRIVATE HEALTH INSURANCE

## 2024-01-02 ENCOUNTER — Encounter: Admit: 2024-01-02 | Discharge: 2024-01-02 | Payer: PRIVATE HEALTH INSURANCE

## 2024-01-05 ENCOUNTER — Encounter: Admit: 2024-01-05 | Discharge: 2024-01-05 | Payer: PRIVATE HEALTH INSURANCE

## 2024-01-08 ENCOUNTER — Encounter: Admit: 2024-01-08 | Discharge: 2024-01-08 | Payer: PRIVATE HEALTH INSURANCE

## 2024-01-08 MED FILL — EVEROLIMUS (IMMUNOSUPPRESSIVE) 0.5 MG PO TAB: 0.5 mg | ORAL | 30 days supply | Qty: 30 | Fill #2 | Status: AC

## 2024-01-08 MED FILL — EVEROLIMUS (IMMUNOSUPPRESSIVE) 1 MG PO TAB: 1 mg | ORAL | 30 days supply | Qty: 30 | Fill #2 | Status: AC

## 2024-01-19 ENCOUNTER — Ambulatory Visit: Admit: 2024-01-19 | Discharge: 2024-01-20 | Payer: PRIVATE HEALTH INSURANCE

## 2024-01-19 ENCOUNTER — Ambulatory Visit: Admit: 2024-01-19 | Discharge: 2024-01-19 | Payer: PRIVATE HEALTH INSURANCE

## 2024-01-22 ENCOUNTER — Encounter: Admit: 2024-01-22 | Discharge: 2024-01-22 | Payer: PRIVATE HEALTH INSURANCE

## 2024-01-23 ENCOUNTER — Encounter: Admit: 2024-01-23 | Discharge: 2024-01-23 | Payer: PRIVATE HEALTH INSURANCE

## 2024-01-23 NOTE — Telephone Encounter
 Patient called she was at hospital last night and she have a kidney infection. Please call back. [phone]. Or send message to her MyChart.

## 2024-01-23 NOTE — Telephone Encounter
 Returned patient call- was recently hospitalized with UTI/kidney infection, dc on po abx. Patient c/o pain and is back to work today. Dr Charlanne requesting to see in clinic within the next month. Message sent to [facility] to schedule patient.

## 2024-01-25 ENCOUNTER — Encounter: Admit: 2024-01-25 | Discharge: 2024-01-25 | Payer: PRIVATE HEALTH INSURANCE

## 2024-01-25 NOTE — Progress Notes
 Contacted Jacqueline Shields to refill their medication(s) ZORTRESS  1 MG PO TAB, ZORTRESS  0.5 MG PO TAB.    On MyChart medication refill questionnaire patient reported new conditions: Kidney infection.    The refill was processed. Ambulatory pharmacist notified.    Nolene Hoar  [facility]  [phone]

## 2024-01-28 ENCOUNTER — Encounter: Admit: 2024-01-28 | Discharge: 2024-01-28 | Payer: PRIVATE HEALTH INSURANCE

## 2024-01-29 MED FILL — EVEROLIMUS (IMMUNOSUPPRESSIVE) 1 MG PO TAB: 1 mg | ORAL | 30 days supply | Qty: 30 | Fill #2 | Status: AC

## 2024-01-31 ENCOUNTER — Encounter: Admit: 2024-01-31 | Discharge: 2024-01-31 | Payer: PRIVATE HEALTH INSURANCE

## 2024-01-31 MED FILL — EVEROLIMUS (IMMUNOSUPPRESSIVE) 0.5 MG PO TAB: 0.5 mg | ORAL | 30 days supply | Qty: 30 | Fill #2 | Status: AC

## 2024-02-09 ENCOUNTER — Ambulatory Visit: Admit: 2024-02-09 | Discharge: 2024-02-10 | Payer: PRIVATE HEALTH INSURANCE

## 2024-02-09 ENCOUNTER — Encounter: Admit: 2024-02-09 | Discharge: 2024-02-09 | Payer: PRIVATE HEALTH INSURANCE

## 2024-02-12 ENCOUNTER — Encounter: Admit: 2024-02-12 | Discharge: 2024-02-12 | Payer: PRIVATE HEALTH INSURANCE

## 2024-02-12 DIAGNOSIS — N186 End stage renal disease: Secondary | ICD-10-CM

## 2024-02-12 DIAGNOSIS — D849 Immunodeficiency, unspecified: Secondary | ICD-10-CM

## 2024-02-12 DIAGNOSIS — Z79899 Other long term (current) drug therapy: Secondary | ICD-10-CM

## 2024-02-12 DIAGNOSIS — Z940 Kidney transplant status: Principal | ICD-10-CM

## 2024-02-12 NOTE — Progress Notes
 LOV 11/01/23 - 60mo f/u  - Not checking BP at home  - CGM 69 % TIR  - C/o continued incontinence since stent placement, f/u 4/17  - Diarrhea has improved  - PET scan reviewed with patient   - Discussed need to repeat colonoscopy, need to send message to GI  - Restart everolimus  1 mg bid, goal 4-5, [place] goal would be set to 4-5    IS: labs every 95mo  Everolimus  1 mg AM, 1.5 mg PM  Prograf  1 mg bid    10/03/23 DSA  Beads I Negative: No donor specific antibody is present   Beads II Negative: No donor specific antibody is present      08/26/23 DSA  Beads I Negative: No donor specific antibody is present   Beads II Negative: No donor specific antibody is present       06/20/23 DSA  Beads I Negative: No donor specific antibody is present   Beads II Positive: Weak DSA to DR53 MFI=1,000     04/14/23 DSA  Beads I Negative: No donor specific antibody is present   Beads II Positive: Weak DSA to DR53 MFI=1,000       Visit:   Recent hospitalization at [facility] for UTI, nonobstructing nephrolithiasis, hydronephrosis. Obtain imaging from [place].    C/o incontinence following stent replacement. Start flomax  0.4 mg daily    Labs this week or next with [facility]. Has taken everolimus  and tac today.    Karnofsky score: complete, working full time

## 2024-02-13 ENCOUNTER — Encounter: Admit: 2024-02-13 | Discharge: 2024-02-13 | Payer: PRIVATE HEALTH INSURANCE

## 2024-02-13 ENCOUNTER — Ambulatory Visit: Admit: 2024-02-13 | Discharge: 2024-02-14 | Payer: PRIVATE HEALTH INSURANCE

## 2024-02-13 DIAGNOSIS — D849 Immunodeficiency, unspecified: Principal | ICD-10-CM

## 2024-02-13 MED ORDER — TAMSULOSIN 0.4 MG PO CAP
.4 mg | ORAL_CAPSULE | Freq: Every evening | ORAL | 3 refills | 90.00000 days | Status: AC
Start: 2024-02-13 — End: ?

## 2024-02-13 NOTE — Progress Notes
 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 On PD Jan 2019  DDRT  KPDI 92%, cPRA 0%  Physical Crossmatch B cell negative T cell negative  CMV D -/ R -  Induction: Thymo  Maintenance IS: Dual  IS   PD not removed with surgery   Post-op Complications:  - None    Referring Nephrologist:  Jacqueline Shields  [address]  [place]   [place] [address]  Phone: [phone]  Fax: [phone]     Dear Dr. Marinda Shields,    Jacqueline Shields is a 58 year old female with kidney disease who presents for follow-up after a kidney infection.    She experienced a severe kidney infection causing significant pain. The pain was intense, described as 'awful' and causing tears, initially mistaken for sciatic nerve pain. She was treated with an IV antibiotic in the hospital and then discharged to complete a seven-day course of oral antibiotics at home, which resolved her symptoms.    She has a history of kidney disease, likely related to diabetes and recurrent kidney stones. During the recent infection, she was told there may have been nonobstructing nephrolithiasis based on documentation, but she is unsure if this was confirmed. She has a history of kidney stones, which previously led to multiple procedures and contributed to her kidney failure diagnosis. She is concerned about the possibility of kidney stones and the potential for future infections.    She reports issues with urinary incontinence, which she associates with a previous stent procedure. The incontinence is constant and troublesome, impacting her daily activities, especially at work. She has not yet scheduled a follow-up with her urologist.    Her blood pressure is reportedly stable, and she has experienced weight loss, which she attributes to decreased appetite while on Mounjaro . Her blood sugar control has improved, as indicated by a better hemoglobin A1c. No side effects from her new medication regimen.    REVIEW OF SYSTEMS: Comprehensive 14-point ROS reviewed  Positives noted in HPI otherwise negative.    Past History:  Past Medical History:    Abnormal biopsy of kidney    Anemia    Chronic kidney disease    Coronary artery disease due to lipid rich plaque    DM (diabetes mellitus), type 2 (CMS-HCC)    ESRD (end stage renal disease) (CMS-HCC)    Gastroparesis    GERD (gastroesophageal reflux disease)    H/O kidney transplant    HLD (hyperlipidemia)    Hypertension    Kidney failure    Kidney stones    MGUS (monoclonal gammopathy of unknown significance)    Obesity    Proteinuria    PUD (peptic ulcer disease)    PVD (peripheral vascular disease)       Surgical History:   Procedure Laterality Date    ANGIOGRAPHY CORONARY ARTERY WITH LEFT HEART CATHETERIZATION N/A 07/03/2020    Performed by Analysse Quinonez, Kamal, MD at [facility] CATH LAB    POSSIBLE PERCUTANEOUS CORONARY STENT PLACEMENT WITH ANGIOPLASTY N/A 07/03/2020    Performed by Charlanne Francois, MD at [facility] CATH LAB    ALLOTRANSPLANTATION KIDNEY FROM NON LIVING DONOR WITHOUT RECIPIENT NEPHRECTOMY N/A 07/15/2020    Performed by Terrie Sieving, MD at [facility] OR  REMOVAL TUNNELED INTRAPERITONEAL CATHETER Bilateral 08/24/2020    Performed by Terrie Sieving, MD at [facility] OR    CYSTOURETHROSCOPY WITH REMOVAL FOREIGN BODY/ CALCULUS/ STENT FROM URETHRA/ BLADDER - SIMPLE Right 08/24/2020    Performed by Carolynn Ruther ORN, MD at [facility] OR    ESOPHAGOGASTRODUODENOSCOPY WITH BIOPSY - FLEXIBLE N/A 04/17/2023    Performed by Chipper Neas, MD at [facility] ENDO    COLONOSCOPY WITH BIOPSY - FLEXIBLE N/A 04/17/2023    Performed by Chipper Neas, MD at [facility] ENDO    ESOPHAGOGASTRODUODENOSCOPY WITH SPECIMEN COLLECTION BY BRUSHING/ WASHING N/A 07/21/2023    Performed by Arlan Nancyann PARAS, MD at [facility] ENDO    ANTEGRADE UROGRAPHY Right 09/22/2023 Performed by Erskin Alm LABOR, MD at [facility] OR    RETROGRADE UROGRAPHY WITH/ WITHOUT KUB Right 09/22/2023    Performed by Erskin Alm LABOR, MD at [facility] OR    REMOVAL, NEPHROSTOMY TUBE, WITH FLUOROSCOPIC GUIDANCE Right 09/22/2023    Performed by Erskin Alm LABOR, MD at [facility] OR    CYSTOURETHROSCOPY WITH URETEROSCOPY WITH TREATMENT URETERAL STRICTURE Right 09/22/2023    Performed by Erskin Alm LABOR, MD at [facility] OR    CYSTOURETHROSCOPY WITH URETEROSCOPY AND/ OR PYELOSCOPY - DIAGNOSTIC Right 11/17/2023    Performed by Erskin Alm LABOR, MD at [facility] OR    RETROGRADE UROGRAPHY WITH/ WITHOUT KUB Right 11/17/2023    Performed by Erskin Alm LABOR, MD at [facility] OR    CYSTOURETHROSCOPY WITH REMOVAL FOREIGN BODY/ CALCULUS/ STENT FROM URETHRA/ BLADDER - SIMPLE Right 11/17/2023    Performed by Erskin Alm LABOR, MD at [facility] OR    CATHETER IMPLANT/REVISION      PD cath    CESAREAN SECTION  1991    HX HYSTERECTOMY      still has ovaries    HX LITHOTRIPSY      HX TUBAL LIGATION  1995       Social History     Socioeconomic History    Marital status: Married     Spouse name: Roger    Number of children: 2   Tobacco Use    Smoking status: Never    Smokeless tobacco: Never   Vaping Use    Vaping status: Never Used   Substance and Sexual Activity    Alcohol use: Not Currently    Drug use: Never    Sexual activity: Not Currently     Partners: Male   Social History Narrative    [place] Works at [employer], puts labels on bottles.       Family History   Problem Relation Name Age of Onset    Diabetes Mother Mom     Hypertension Mother Mom     Diabetes Father Lukemia     Hypertension Father Lukemia     Cancer-Hematologic Father Lukemia     Cancer Father Lukemia     Diabetes Sister Macario     Migraines Sister Macario     Cancer Sister Macario         Endometrial       No Known Allergies    Current Medications:    Current Outpatient Medications:     aspirin  EC (ASPIR-LOW) 81 mg tablet, Take one tablet by mouth daily. Take with food., Disp: 90 tablet, Rfl: 3    blood sugar diagnostic (ONETOUCH VERIO TEST STRIPS) test strip, Use one strip as directed before meals and at bedtime. ICD-10: Type II Diabetes with unspecified complications; with long term insulin  use E11.8, Z79.4  Indications: type 2 diabetes mellitus, Disp: 300 strip, Rfl: 3    CALCIUM  PO, Take 1 tablet by mouth daily., Disp: , Rfl:     CHOLEcalciferoL  (vitamin D3) 50 mcg (2,000 unit) capsule, TAKE 1 CAPSULE BY MOUTH EVERY DAY, Disp: 90 capsule, Rfl: 3    DEXCOM G7 SENSOR sensor device, Use one each as directed every 10 days. Indications: type 2 diabetes mellitus, Disp: 9 each, Rfl: 3    everolimus  (immunosuppressive) (ZORTRESS ) 0.5 mg tablet, Take one tablet by mouth every evening. Take along with 1 mg tablet for a total everolimus  dose of 1 mg every morning and 0.5 mg every evening.  Indications: prevent kidney transplant rejection, Disp: 30 tablet, Rfl: 3    everolimus  (immunosuppressive) (ZORTRESS ) 1 mg tablet, Take one tablet by mouth every morning. Take along with 0.5 mg tablet for a total everolimus  dose of 1 mg every morning and 0.5 mg every evening.  Indications: prevent kidney transplant rejection, Z94.0, Disp: 30 tablet, Rfl: 3    ferrous sulfate  (FEOSOL) 325 mg (65 mg iron ) tablet, Take one tablet by mouth at bedtime daily. Take on an empty stomach at least 1 hour before or 2 hours after food., Disp: 30 tablet, Rfl: 3    glucose (DEX4 GLUCOSE) 4 gram chewable tablet, Chew four tablets by mouth as Needed. Indications: low blood sugar, Disp: 50 tablet, Rfl: 0    insulin  aspart (U-100) (NOVOLOG  FLEXPEN U-100 INSULIN ) 100 unit/mL (3 mL) PEN, Inject 10 units subcutaneously with breakfast, 8 units with lunch, and 10 units with evening meal PLUS sliding scale If blood sugar 151-200: Take extra 2 unit. If blood sugar 201-250: Take extra 4 units. If blood sugar 251-300: Take extra 6 units. If blood sugar is 301-350: Take extra 8 units. If blood sugars > 350: Take extra 10 units. Max daily dose 45 units. Indications: type 2 diabetes mellitus, Disp: 45 mL, Rfl: 1    insulin  degludec (TRESIBA FLEXTOUCH U-100) 100 unit/mL (3 mL) subcutaneous PEN, Inject sixty Units under the skin at bedtime daily., Disp: , Rfl:     insulin  glargine (LANTUS  SOLOSTAR U-100 INSULIN ) 100 unit/mL (3 mL) subcutaneous PEN, Inject fifty Units under the skin at bedtime daily. Indications: type 2 diabetes mellitus, Disp: 60 mL, Rfl: 3    lancets 33 gauge (ONETOUCH DELICA 33 GUAGE) 33 gauge, Use one each as directed four times daily. ICD-10: Type II Diabetes with unspecified complications; with long term insulin  use E11.8, Z79.4, Disp: 300 each, Rfl: 3    pen needle, diabetic (BD ULTRA-FINE NANO PEN NEEDLE) 32 gauge x 5/32 pen needle, Use one each as directed as Needed. Use with insulin  injections., Disp: 300 each, Rfl: 3    rosuvastatin  (CRESTOR ) 20 mg tablet, Take one tablet by mouth daily., Disp: 90 tablet, Rfl: 1    tacrolimus  (PROGRAF ) 1 mg capsule, Take one capsule by mouth twice daily. Indications: prevent kidney transplant rejection, Disp: 180 capsule, Rfl: 3    tamsulosin  (FLOMAX ) 0.4 mg capsule, Take one capsule by mouth at bedtime daily. Do not crush, chew or open capsules. Take 30 minutes following the same meal each day., Disp: 90 capsule, Rfl: 3    tirzepatide  (MOUNJARO ) 7.5 mg/0.5 mL injector PEN, Inject 0.5 mL under the skin every 7 days. Indications: type 2 diabetes mellitus, Disp: 6 mL, Rfl: 3    Physical Exam:  Vitals:    02/13/24 1052 02/13/24 1053   BP: 112/80 113/71   BP Source: Arm, Right Upper Arm, Right Upper  Pulse: 93 99   Temp: 36.4 ?C (97.6 ?F)    SpO2: 99%    TempSrc: Oral    PainSc: Zero    Weight: 94.3 kg (208 lb)    Height: 170.2 cm (5' 7)    Body mass index is 32.58 kg/m?SABRA   General: NAD, A+Ox4, calm and pleasant. Appears to be stated age.   HENT: Unremarkable, no oral lesions  Neck: Normal ROM, no LAD, no JVD  Lungs: Bilat. CTA  CV: RRR, S1, S2 without carotid bruit or murmur, no edema  Abdomen: Soft, N/D, N/T without hepatosplenomegaly  Incision: healed  M/S: Normal ROM and strength  Neuro: Nonfocal deficits without tremors  Skin: No skin rash  Psyc: Stable  Laboratory studies:     CMP:      Latest Ref Rng & Units 01/19/2024    12:17 PM 12/22/2023    10:21 AM 12/09/2023     9:39 AM 12/02/2023    11:19 AM 10/27/2023     2:48 PM   CMP   Sodium 137 - 147 mmol/L 142  141  144  144  142    Potassium 3.5 - 5.1 mmol/L 4.0  4.2  4.2  3.5  4.4    Chloride 98 - 110 mmol/L 114  114  109  109  110    CO2 21 - 30 mmol/L 18  24  25  23  22     Anion Gap 3 - 12 10  3  10  12  10     Blood Urea Nitrogen 7 - 25 mg/dL 46  44  41  53  30    Creatinine 0.40 - 1.00 mg/dL 5.60  6.32  6.14  5.87  3.27    Glucose 70 - 100 mg/dL 829  870  892  96  72    Calcium  8.5 - 10.6 mg/dL 8.4  8.3  8.6  8.5  8.4    Total Protein 6.0 - 8.0 g/dL 7.3  7.0  7.6  7.8  7.6    Albumin 3.5 - 5.0 g/dL 3.8  3.6  3.6  3.9  3.7    Alk Phosphatase 25 - 110 U/L 73  66  69  65  76    ALT (SGPT) 7 - 56 U/L 12  14  15  13  22     AST 7 - 40 U/L 14  16  18  15  21     Total Bilirubin 0.2 - 1.3 mg/dL 0.3  0.3  0.4  0.4  0.5    GFR >60 mL/min 11  14  13  12  16       Hemoglobin A1C (%)   Date Value   01/19/2024 7.2 (H)   04/13/2023 11.1 (H)   10/30/2021 11.6 (H)   05/29/2021 10.8 (H)     PTH Hormone   Date Value   01/19/2024 176.2 pg/mL (H)   10/03/2023 116.6 pg/mL (H)   04/25/2023 151.5 PG/ML (H)   10/30/2021 195.9 PG/ML (H)     No results found for: LIPASE  No results found for: AMY  BK Virus Plasma Quant (no units)   Date Value   04/14/2023 BK Virus Not Detected   10/30/2021     NOT DETECTED  Reference range: NOT DETECTED  Unit: IU/mL  .  Assay Range: 33 IU/mL to 3.30E+08 IU/mL  .  One IU is equal to 0.33 copies of BKV.  .  The limit of quantitation (LOQ) is 33  IU/mL. BK virus DNA detected  below the LOQ  will be reported as Detected:<33 IU/mL.  .  This test was developed and its performance characteristics  determined by  [facility]. It has not been cleared or approved by the U.S.  Food and Drug  Administration. Results should be used in conjunction with clinical  findings,  and should not form the sole basis for a diagnosis or treatment  decision.  ____________________________________________________________  Testing Performed At:  [facility]  [address]  [place], [place] [address]  Laboratory Director: Lucious Betters Ph.D., BCLD (ABB)  CLIA#: [id number]  Phone: [phone]     10/02/2021     NOT DETECTED  Reference range: NOT DETECTED  Unit: IU/mL  TESTING PERFORMED AT LOW VOLUME ON PLASMA SPECIMEN FOR BKV qPCR, MAY  AFFECT  RESULTS.  SABRA  Assay Range: 33 IU/mL to 3.30E+08 IU/mL  .  One IU is equal to 0.33 copies of BKV.  .  The limit of quantitation (LOQ) is 33 IU/mL. BK virus DNA detected  below the LOQ  will be reported as Detected:<33 IU/mL.  .  This test was developed and its performance characteristics  determined by  [facility]. It has not been cleared or approved by the U.S.  Food and Drug  Administration. Results should be used in conjunction with clinical  findings,  and should not form the sole basis for a diagnosis or treatment  decision.  ____________________________________________________________  Testing Performed At:  [facility]  [address]  [place], [place] [address]  Laboratory Director: Lucious Betters Ph.D., BCLD (ABB)  CLIA#: [id number]  Phone: [phone]     09/04/2021     NOT DETECTED  Reference range: NOT DETECTED  Unit: IU/mL  .  Assay Range: 33 IU/mL to 3.30E+08 IU/mL  .  One IU is equal to 0.33 copies of BKV.  .  The limit of quantitation (LOQ) is 33 IU/mL. BK virus DNA detected  below the LOQ  will be reported as Detected:<33 IU/mL.  .  This test was developed and its performance characteristics  determined by  [facility]. It has not been cleared or approved by the U.S.  Food and Drug  Administration. Results should be used in conjunction with clinical  findings,  and should not form the sole basis for a diagnosis or treatment  decision.  ____________________________________________________________  Testing Performed At:  [facility]  [address]  [place], [place] [address]  Laboratory Director: Lucious Betters Ph.D., BCLD (ABB)  CLIA#: [id number]  Phone: [phone]     08/03/2021     NOT DETECTED  Reference range: NOT DETECTED  Unit: IU/mL  .  Assay Range: 33 IU/mL to 3.30E+08 IU/mL  .  One IU is equal to 0.33 copies of BKV.  .  The limit of quantitation (LOQ) is 33 IU/mL. BK virus DNA detected  below the LOQ  will be reported as Detected:<33 IU/mL.  .  This test was developed and its performance characteristics  determined by  [facility]. It has not been cleared or approved by the U.S.  Food and Drug  Administration. Results should be used in conjunction with clinical  findings,  and should not form the sole basis for a diagnosis or treatment  decision.  ____________________________________________________________  Testing Performed At:  [facility]  [address]  [place], [place] [address]  Laboratory Director: Lucious Betters Ph.D., BCLD (ABB)  CLIA#: [id number]  Phone: [phone]  BK Virus Plasma (no units)   Date Value   01/19/2024 Not Detected   12/22/2023 Not Detected   12/09/2023 Not Detected   12/02/2023 Not Detected   10/27/2023 Not Detected     CMV DNA Quant PCR ([IU]/mL)   Date Value   04/14/2023 CMV DNA NOT DETECTED   12/04/2021 CMV DNA NOT DETECTED   10/30/2021 CMV DNA NOT DETECTED   10/02/2021 CMV DNA NOT DETECTED   09/04/2021 CMV DNA NOT DETECTED     IU/mL CMV Blood (no units)   Date Value   12/04/2021     <50 IU/mL  The test method detects and quantitates CMV DNA using the Abbott RealTime assay,   and is approved by the FDA for monitoring hematopoietic stem cell transplant   patients who are undergoing anti-CMV therapy.  Please correlate results with the   clinical status of the patient.     10/30/2021     <50 IU/mL  The test method detects and quantitates CMV DNA using the Abbott RealTime assay,   and is approved by the FDA for monitoring hematopoietic stem cell transplant   patients who are undergoing anti-CMV therapy.  Please correlate results with the   clinical status of the patient.     10/02/2021     <50 IU/mL  The test method detects and quantitates CMV DNA using the Abbott RealTime assay,   and is approved by the FDA for monitoring hematopoietic stem cell transplant   patients who are undergoing anti-CMV therapy.  Please correlate results with the   clinical status of the patient.     09/04/2021     <50 IU/mL  The test method detects and quantitates CMV DNA using the Abbott RealTime assay,   and is approved by the FDA for monitoring hematopoietic stem cell transplant   patients who are undergoing anti-CMV therapy.  Please correlate results with the   clinical status of the patient.     08/03/2021     <50 IU/mL  The test method detects and quantitates CMV DNA using the Abbott RealTime assay,   and is approved by the FDA for monitoring hematopoietic stem cell transplant   patients who are undergoing anti-CMV therapy.  Please correlate results with the   clinical status of the patient.       No results found for: COPIES  EBV DNA, Quant. (no units)   Date Value   04/14/2023 EBV DNA Not Detected       TACROLIMUS  LEVEL:  Tacrolimus  Immunoassay (ng/mL)   Date Value   01/19/2024 5.3   12/22/2023 5.8   12/09/2023 6.6   12/02/2023 5.1   10/27/2023 5.0   10/03/2023 9.3   08/26/2023 5.1   07/24/2023 6.1   04/25/2023 7.3   04/21/2023 5.1   04/20/2023 6.1   04/19/2023 5.7   04/18/2023 4.9 (L)   04/17/2023 5.1   04/16/2023 10.0   04/15/2023 8.7       CBC with Diff:      Latest Ref Rng & Units 01/19/2024    12:17 PM 12/22/2023    10:21 AM   CBC with Diff   WBC 4.50 - 11.00 10*3/uL 6.80  7.60    RBC 4.00 - 5.00 10*6/uL 3.68  3.99    Hemoglobin 12.0 - 15.0 g/dL 89.9  89.2    Hematocrit 36.0 - 45.0 % 29.2  32.4    MCV 80.0 - 100.0 fL 79.4  81.1    MCH 26.0 - 34.0 pg  27.2  26.8    MCHC 32.0 - 36.0 g/dL 65.7 66.9    RDW 88.9 - 15.0 % 15.9  16.1    Platelet Count 150 - 400 10*3/uL 186  224    MPV 7.0 - 11.0 fL 8.7  8.4    Neurtrophils 41.0 - 77.0 % 76.8  79.3    Absolute Neutrophils 1.80 - 7.00 10*3/uL 5.20  6.00    Absolute Lymph Count 1.00 - 4.80 10*3/uL 0.80  0.80    Absolute Monocyte Count 0.00 - 0.80 10*3/uL 0.50  0.60    Eosinophils 0.0 - 5.0 % 3.0  1.9    Absolute Eosinophil Count 0.00 - 0.45 10*3/uL 0.20  0.10    Basophils 0.0 - 2.0 % 0.7  0.5      Lab Results   Component Value Date/Time    IRON  33 (L) 01/19/2024 12:17 PM    TIBC 215 (L) 01/19/2024 12:17 PM    PSAT 15 (L) 01/19/2024 12:17 PM    FERRITIN 327 (H) 01/19/2024 12:17 PM    FERRITIN 550 (H) 12/09/2023 09:39 AM       Urinalysis:  Lab Results   Component Value Date/Time    UCOLOR Yellow 01/19/2024 12:38 PM    TURBID 2+ (A) 01/19/2024 12:38 PM    USPGR 1.008 01/19/2024 12:38 PM    UPH 6.0 01/19/2024 12:38 PM    UPROTEIN 1+ (A) 01/19/2024 12:38 PM    UAGLU 2+ (A) 01/19/2024 12:38 PM    UKET Negative 01/19/2024 12:38 PM    UBILE Negative 01/19/2024 12:38 PM    UBLD 1+ (A) 01/19/2024 12:38 PM    UROB Normal 01/19/2024 12:38 PM     Protein/CR ratio (no units)   Date Value   01/19/2024 0.76 (H)   12/22/2023 0.81 (H)   12/09/2023 0.72 (H)   10/03/2023 1.23 (H)   08/26/2023 0.96 (H)   04/13/2023 1.7 (H)   01/08/2022 0.7 (H)   12/04/2021 0.7 (H)   10/30/2021 0.7 (H)   10/02/2021 0.5 (H)       Imaging:  Results for orders placed during the hospital encounter of 04/13/23    CT ABD/PELV WO CONTRAST    Impression  1. Right lower quadrant renal transplant. Persistent hydronephrosis. Perinephric and periureteral stranding is nonspecific and may be from infection or inflammation.    2. Decompressed urinary bladder about a Foley catheter. Small gas in the bladder lumen, likely from the presence of the catheter. Diffuse bladder wall thickening and perivesicular stranding, nonspecific but may be from cystitis.    3. Small to upper limits normal size retroperitoneal and pelvic lymph nodes, indeterminate but may be reactive. Early lymphoproliferative process such as post transplant lymphoproliferative disorder could potentially have this appearance. Short interval follow-up CT abdomen pelvis in 3 months recommended to evaluate for stability.    4. Mild native renal atrophy. Subcentimeter nonobstructing right renal calculi.      Finalized by SHAUN BEST, M.D. on 04/18/2023 3:18 PM. Dictated by EVELEEN PATERSON, M.D. on 04/18/2023 3:09 PM.    Results for orders placed during the hospital encounter of 04/13/23    CHEST 2 VIEWS    Impression  No acute cardiopulmonary abnormality.      Finalized by Duwaine Eagles, M.D. on 04/14/2023 6:57 AM. Dictated by Duwaine Eagles, M.D. on 04/14/2023 6:56 AM.    Assessment and Plan:    Ms. 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 volume depletion, hydronephrosis/hydroureter.  PCNU removed 09/22/2023, dilation of transplant ureteral stricture and placement of ureteral stent - Requiring frequent exchanges.     - PET scan on 06/20/2023: Findings compatible with reactive lymph nodes with less consideration for pathological process or PTLD- Likely colonic issues- will need to follow up with GI    # Kidney infection  Recent kidney infection treated with a 7-day course of antibiotics. Higher risk of recurrence due to urinary tubing.  - Advise to monitor for symptoms of infection and contact the clinic if symptoms recur.  - Review CT scan from recent hospitalization to assess for kidney stones.  Concern for infection in native kidneys. Previous severe pain possibly related to infection in native kidneys.    # Kidney stones  Nonobstructing nephrolithiasis documented on recent imaging. Potential contribution to kidney infection and pain.  - Review CT scan from recent hospitalization to assess for kidney stones.  - If stones are present, test urine for stone formation and consider dietary or medical interventions to prevent recurrence.    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 Symptoms include constant leakage without sensation of needing to urinate, impacting daily activities.  - Initiate tamsulosin  0.4 mg at bedtime to help with urinary symptoms.  - Advise to schedule an appointment with urologist Dr. Purcell for further evaluation and management.    # Diabetes mellitus  Improved blood sugar control with better hemoglobin A1c. Weight loss likely due to Mounjaro , reducing appetite.      # Immunosuppression:  - On tac and everolimus  , Level ~5 for both     10/03/23 DSA  Beads I Negative: No donor specific antibody is present   Beads II Negative: No donor specific antibody is present       08/26/23 DSA  Beads I Negative: No donor specific antibody is present   Beads II Negative: No donor specific antibody is present       06/20/23 DSA  Beads I Negative: No donor specific antibody is present   Beads II Positive: Weak DSA to DR53 MFI=1,000     04/14/23 DSA  Beads I Negative: No donor specific antibody is present   Beads II Positive: Weak DSA to DR53 MFI=1,000     - Remains off MPA, 04/17/2023 colon biopsy shows colitis rare: Cells positive for CMV on colonic biopsy  - off prednisone  for now,      #. HTN:   Not on antihypertensive medications     # ID:  - Rare staining positive for CMV in colon biopsy,   - No longer on valcyte .    # MGUS, IgG kappa  Ongoing follow-up with hematology  FLC ratio 1.88  Serum M 0.62    # CAD s/p cath 07/03/20: superior branch LAD 80% disease, RCA 40-50% mid RCA stenosis at/around origin RV  marginal branch; RV marginal branch also has ~80% stenosis; guide directed medical therapy, risk factor optimization  -On aspirin , statin, not on beta-blocker      #) Health maintenance by PCP  Pap smear: no longer does  Mammogram: April 2024  Colonoscopy: UTD, 04/17/23 (recc repeat 09/2023, Upper endo completed but not lower)  Dermatology: Per PCP, needs annual skin cancer surveillance due to long-term immunosuppression     RTC 4 months  Labs monthly      Aletha Bring, MD     Cc: Jacqueline Shields  Cc: Adrien Eck    Please contact the [facility] at [phone] for any transplant related questions or concerns that may arise.

## 2024-02-14 ENCOUNTER — Encounter: Admit: 2024-02-14 | Discharge: 2024-02-14 | Payer: PRIVATE HEALTH INSURANCE

## 2024-02-15 ENCOUNTER — Encounter: Admit: 2024-02-15 | Discharge: 2024-02-15 | Payer: PRIVATE HEALTH INSURANCE

## 2024-02-15 NOTE — Telephone Encounter
 02/13/24 Clinic visit follow up:    Delegated request to obtain imaging from [facility] ED encounter on 01/22/24 to AA, Morna.

## 2024-02-19 ENCOUNTER — Encounter: Admit: 2024-02-19 | Discharge: 2024-02-19 | Payer: PRIVATE HEALTH INSURANCE

## 2024-02-20 ENCOUNTER — Encounter: Admit: 2024-02-20 | Discharge: 2024-02-20 | Payer: PRIVATE HEALTH INSURANCE

## 2024-02-21 ENCOUNTER — Encounter: Admit: 2024-02-21 | Discharge: 2024-02-21 | Payer: PRIVATE HEALTH INSURANCE

## 2024-02-22 ENCOUNTER — Encounter: Admit: 2024-02-22 | Discharge: 2024-02-22 | Payer: PRIVATE HEALTH INSURANCE

## 2024-02-22 NOTE — Progress Notes
 Contacted [facility] to refill their medication(s) EVEROLIMUS  (IMMUNOSUPPRESSIVE) 1 MG PO TAB, EVEROLIMUS  (IMMUNOSUPPRESSIVE) 0.5 MG PO TAB.    We were unable to reach the patient after three attempts. At this time, we will no longer attempt to contact the patient for the refill. The patient may be re-enrolled in the pharmacy refill management program at any time by contacting the pharmacy or refilling their medication. Ambulatory pharmacist notified.    Sherryl Sakai  [facility]  [phone]

## 2024-02-25 ENCOUNTER — Encounter: Admit: 2024-02-25 | Discharge: 2024-02-25 | Payer: PRIVATE HEALTH INSURANCE

## 2024-02-26 MED FILL — EVEROLIMUS (IMMUNOSUPPRESSIVE) 0.5 MG PO TAB: 0.5 mg | ORAL | 30 days supply | Qty: 30 | Fill #3 | Status: AC

## 2024-02-26 MED FILL — TACROLIMUS 1 MG PO CAP: 1 mg | ORAL | 90 days supply | Qty: 180 | Fill #2 | Status: AC

## 2024-02-26 MED FILL — EVEROLIMUS (IMMUNOSUPPRESSIVE) 1 MG PO TAB: 1 mg | ORAL | 30 days supply | Qty: 30 | Fill #3 | Status: AC

## 2024-03-06 ENCOUNTER — Encounter: Admit: 2024-03-06 | Discharge: 2024-03-06 | Payer: PRIVATE HEALTH INSURANCE

## 2024-03-07 ENCOUNTER — Encounter: Admit: 2024-03-07 | Discharge: 2024-03-07 | Payer: PRIVATE HEALTH INSURANCE

## 2024-03-08 ENCOUNTER — Ambulatory Visit: Admit: 2024-03-08 | Discharge: 2024-03-09 | Payer: PRIVATE HEALTH INSURANCE

## 2024-03-08 ENCOUNTER — Encounter: Admit: 2024-03-08 | Discharge: 2024-03-08 | Payer: PRIVATE HEALTH INSURANCE

## 2024-03-08 ENCOUNTER — Ambulatory Visit: Admit: 2024-03-08 | Discharge: 2024-03-08 | Payer: PRIVATE HEALTH INSURANCE

## 2024-03-11 ENCOUNTER — Encounter: Admit: 2024-03-11 | Discharge: 2024-03-11 | Payer: PRIVATE HEALTH INSURANCE

## 2024-03-11 DIAGNOSIS — R7989 Other specified abnormal findings of blood chemistry: Principal | ICD-10-CM

## 2024-03-11 DIAGNOSIS — Z940 Kidney transplant status: Secondary | ICD-10-CM

## 2024-03-11 MED ORDER — CEPHALEXIN 250 MG PO CAP
250 mg | ORAL_CAPSULE | Freq: Two times a day (BID) | ORAL | 0 refills | 7.00000 days | Status: AC
Start: 2024-03-11 — End: ?

## 2024-03-11 NOTE — Telephone Encounter
 Received call back from patient. Patient will start antibiotic tonight for UTI.    Reviewed labs from 8/8 with patient. Hgb down to 9.1, denies any s/sx of bleeding. Will check PA for aranesp in clinic this Friday. Iron  20, low, despite taking ferrous sulfate  325 mg daily. Patient reports taking the ferrous sulfate  with all of her evening meds. Education provided to Take on an empty stomach at least 1 hour before or 2 hours after food. Patient will start taking first thing in the morning.    Discussed increased creatinine and need for kidney ultrasound. Patient off work on Fridays, will try to get ultrasound scheduled for this Friday.     Patient also reports excessive fatigue, dizziness (nonpositional), avg Bps 110s/60s, feeling of air bubbles in head and ears. Patient described sensation comparable to how it feels with increase in elevation. Sounds are muffled. Patient denies pain.     Routed to provider for review.

## 2024-03-11 NOTE — Telephone Encounter
 Message received from Dr. Charlanne:  Jacqueline Shields, stop flomax ,   Start Ceflex 250mg  BID for 10 days    Called patient, left voicemail to call our office regarding antibiotic recommendation per Dr. Charlanne and okay to stop flomax .

## 2024-03-12 ENCOUNTER — Encounter: Admit: 2024-03-12 | Discharge: 2024-03-12 | Payer: PRIVATE HEALTH INSURANCE

## 2024-03-12 NOTE — Progress Notes
 [email or web address] - PA Aranesp

## 2024-03-14 ENCOUNTER — Encounter: Admit: 2024-03-14 | Discharge: 2024-03-14 | Payer: PRIVATE HEALTH INSURANCE

## 2024-03-15 ENCOUNTER — Ambulatory Visit: Admit: 2024-03-15 | Discharge: 2024-03-15 | Payer: PRIVATE HEALTH INSURANCE

## 2024-03-15 ENCOUNTER — Encounter: Admit: 2024-03-15 | Discharge: 2024-03-15 | Payer: PRIVATE HEALTH INSURANCE

## 2024-03-15 DIAGNOSIS — N179 Acute kidney failure, unspecified: Principal | ICD-10-CM

## 2024-03-18 ENCOUNTER — Encounter: Admit: 2024-03-18 | Discharge: 2024-03-18 | Payer: PRIVATE HEALTH INSURANCE

## 2024-03-18 MED ORDER — INSULIN GLARGINE 100 UNIT/ML (3 ML) SC INJ PEN
20 [IU] | Freq: Every evening | SUBCUTANEOUS | 0 refills | Status: DC
Start: 2024-03-18 — End: 2024-03-20
  Administered 2024-03-19: 03:00:00 20 [IU] via SUBCUTANEOUS

## 2024-03-18 MED ORDER — INSULIN ASPART 100 UNIT/ML SC FLEXPEN
0-6 [IU] | Freq: Before meals | SUBCUTANEOUS | 0 refills | Status: DC
Start: 2024-03-18 — End: 2024-03-20

## 2024-03-18 MED ORDER — SENNOSIDES-DOCUSATE SODIUM 8.6-50 MG PO TAB
1 | Freq: Every day | ORAL | 0 refills | Status: DC | PRN
Start: 2024-03-18 — End: 2024-03-20

## 2024-03-18 MED ORDER — EVEROLIMUS (IMMUNOSUPPRESSIVE) 0.5 MG PO TAB
1 mg | Freq: Every morning | ORAL | 0 refills | Status: DC
Start: 2024-03-18 — End: 2024-03-20
  Administered 2024-03-19 – 2024-03-20 (×2): 1 mg via ORAL

## 2024-03-18 MED ORDER — ROSUVASTATIN 5 MG PO TAB
20 mg | Freq: Every day | ORAL | 0 refills | Status: DC
Start: 2024-03-18 — End: 2024-03-19
  Administered 2024-03-19: 14:00:00 20 mg via ORAL

## 2024-03-18 MED ORDER — MELATONIN 5 MG PO TAB
5 mg | Freq: Every evening | ORAL | 0 refills | Status: DC | PRN
Start: 2024-03-18 — End: 2024-03-20

## 2024-03-18 MED ORDER — EVEROLIMUS (IMMUNOSUPPRESSIVE) 0.5 MG PO TAB
.5 mg | Freq: Every day | ORAL | 0 refills | Status: DC
Start: 2024-03-18 — End: 2024-03-20
  Administered 2024-03-19: 0.5 mg via ORAL

## 2024-03-18 MED ORDER — DEXTROSE 50 % IN WATER (D50W) IV SYRG
12.5-25 g | INTRAVENOUS | 0 refills | Status: DC | PRN
Start: 2024-03-18 — End: 2024-03-20

## 2024-03-18 MED ORDER — POTASSIUM CHLORIDE 20 MEQ PO TBTQ
20 meq | Freq: Once | ORAL | 0 refills | Status: CP
Start: 2024-03-18 — End: ?
  Administered 2024-03-19: 04:00:00 20 meq via ORAL

## 2024-03-18 MED ORDER — FERROUS SULFATE 325 MG (65 MG IRON) PO TAB
325 mg | Freq: Every evening | ORAL | 0 refills | Status: DC
Start: 2024-03-18 — End: 2024-03-20
  Administered 2024-03-20: 01:00:00 325 mg via ORAL

## 2024-03-18 MED ORDER — TACROLIMUS 1 MG PO CAP
1 mg | Freq: Two times a day (BID) | ORAL | 0 refills | Status: DC
Start: 2024-03-18 — End: 2024-03-20
  Administered 2024-03-19 – 2024-03-20 (×3): 1 mg via ORAL

## 2024-03-18 MED ORDER — CEPHALEXIN 250 MG PO CAP
250 mg | Freq: Two times a day (BID) | ORAL | 0 refills | Status: DC
Start: 2024-03-18 — End: 2024-03-20
  Administered 2024-03-19 – 2024-03-20 (×3): 250 mg via ORAL

## 2024-03-18 MED ORDER — POLYETHYLENE GLYCOL 3350 17 GRAM PO PWPK
1 | Freq: Every day | ORAL | 0 refills | Status: DC | PRN
Start: 2024-03-18 — End: 2024-03-20

## 2024-03-18 MED ORDER — SODIUM CHLORIDE 0.9% IV SOLP
INTRAVENOUS | 0 refills | Status: AC
Start: 2024-03-18 — End: ?
  Administered 2024-03-19: 03:00:00 1000.0000 mL via INTRAVENOUS

## 2024-03-18 NOTE — H&P (View-Only)
 Internal Medicine Admission History and Physical Examination      Name:  Jacqueline Shields                                      MRN:  [id number]                           Admission Date:  03/18/2024    Assessment and Plan                    Assessment/Plan:    Active Problems:    * No active hospital problems. *      Jacqueline Shields is a 58 y.o. 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  Transplant was 07/15/2020  -Per chart review, baseline creatinine was around 3 recently.  She had an AKI which was thought to be secondary to pyelonephritis and creatinine continued to rise, most recently 03/15/2024 was 6.23  -History of nonobstructing nephrolithiasis and ureteral stones as well as recent pyelonephritis  -PTA medication regimen: Everolimus  1 mg every morning and 0.5 mg nightly  -Recent renal ultrasound 03/15/2024 showed mild hydroureteronephrosis, decreased since September 2024, progressive decrease in size and cortical thickness of transplant kidney.    Plan  >Continue PTA Everolimus  1 mg every morning and 0.5 mg nightly and tacrolimus  1 mg twice daily  >Tacrolimus  level ordered  >Admission labs ordered including CBC, CMP magnesium , urine sodium, urine creatinine, UA  >Monitor on telemetry until admission labs returned in the setting of renal failure and increased risk for electrolyte abnormalities  >Transplant nephrology consulted  >N.p.o. at midnight in case of procedure such as renal biopsy  >Will hold DVT prophylaxis in case of procedure    #Recent pyelonephritis   - Patient reports recurrent urinary tract infections for the last year  - Most recent UTI was diagnosed on 03/11/2024 and she was started on cephalexin  250 mg twice daily (end date 03/21/2024)  Plan  >Continue PTA cephalexin  250 milligrams twice daily for 10 days (end date 03/11/2024)    #Dizziness  #Ear fullness   -Reports a feeling of dizziness and ear fullness over the last several weeks.  Denies feeling like the room was spinning but does occasionally lose balance when standing and she feels that this is because of her ears  - Has an appointment 8/19 with PCP to address this  Plan  >Recommend exam for cerumen impaction in the morning  >PT ordered for treatment of possible vertigo  >Orthostatics ordered      #Chronic microcytic anemia  -PTA regimen: Ferrous sulfate  325 mg nightly, darbepoetin alfa q. 7 days  Plan  >Continue PTA ferrous sulfate  325 mg daily   >CBC ordered    #Type II diabetes  - Patient reports that PTA she takes NovoLog  10 units 3 times daily with meals and glargine 60 units nightly.  Also takes Mounjaro  7.5 mg weekly  Plan  >She will be n.p.o. for possible procedure 8/19  >Glargine 20 units nightly  >Low-dose correction factor  insulin   >Anticipate she will need mealtime insulin  added when she is no longer n.p.o.  >Hold Mounjaro  for now    #Hyperlipidemia  >Continue PTA atorvastatin 20 mg daily    #Urinary incontinence  - Persistent urinary incontinence following ureteral stents, follows with Dr. Purcell and urology  - Previously was taking tamsulosin  0.4 mg nightly to help with urinary symptoms though this was stopped recently    Consultants: Nephrology    FEN: None/ replace PRN /regular diet, n.p.o. at midnight  DVT PPX: Holding in case of procedure such as renal biopsy  Code Status: Prior  Disposition: Admit to medicine for expedited workup of renal failure      Patient discussed with Dr. Fernand Nat Dandy, DO  Internal Medicine PGY-3  Available on Voalte    History of Present Illness     Primary Care Physician: Francis Lew  Verified    Chief Complaint: Concern for renal failure    History of Present Illness: Jacqueline Shields is a 58 y.o. female who presented to the hospital after being called by her nephrologist, Dr. Charlanne team's and being told that she could be directly admitted due to concern for worsening renal function.  She has a history of type 2 diabetes and ESRD secondary to diabetes.  She had a renal transplant in 2021.  Since then, she reports she has had multiple problems within the last year with kidney stones and repeat  UTIs and pyelonephritis.  She is concerned about her kidney and worries that it might not be working well anymore.  She also reports a feeling of fullness in her ears and she had an outpatient visit scheduled for further workup.  She has a feeling of dizziness/lightheadedness but does not feel like the room is spinning.  Has been going on for the last several weeks.  Otherwise, denies fevers/chills, shortness of breath, chest pain nausea, vomiting, diarrhea.  Reports ongoing frequency and leakage since she had her last stents but denies dysuria or hematuria.    Past Medical History       Past Medical History:    Abnormal biopsy of kidney    Anemia    Chronic kidney disease    Coronary artery disease due to lipid rich plaque    DM (diabetes mellitus), type 2 (CMS-HCC)    ESRD (end stage renal disease) (CMS-HCC)    Gastroparesis    GERD (gastroesophageal reflux disease)    H/O kidney transplant    HLD (hyperlipidemia)    Hypertension    Kidney failure    Kidney stones    MGUS (monoclonal gammopathy of unknown significance)    Obesity    Proteinuria    PUD (peptic ulcer disease)    PVD (peripheral vascular disease)     Surgical History:   Procedure Laterality Date    ANGIOGRAPHY CORONARY ARTERY WITH LEFT HEART CATHETERIZATION N/A 07/03/2020    Performed by Gupta, Kamal, MD at [facility] CATH LAB    POSSIBLE PERCUTANEOUS CORONARY STENT PLACEMENT WITH ANGIOPLASTY N/A 07/03/2020    Performed by Charlanne Francois, MD at [facility] CATH LAB    ALLOTRANSPLANTATION KIDNEY FROM NON LIVING DONOR WITHOUT RECIPIENT NEPHRECTOMY N/A 07/15/2020    Performed by Terrie Sieving, MD at [facility] OR    REMOVAL TUNNELED INTRAPERITONEAL CATHETER Bilateral 08/24/2020    Performed by Terrie Sieving, MD at [facility] OR    CYSTOURETHROSCOPY WITH REMOVAL FOREIGN BODY/ CALCULUS/ STENT FROM URETHRA/ BLADDER - SIMPLE Right 08/24/2020    Performed  by Carolynn Ruther ORN, MD at [facility] OR    ESOPHAGOGASTRODUODENOSCOPY WITH BIOPSY - FLEXIBLE N/A 04/17/2023    Performed by Chipper Neas, MD at [facility] ENDO    COLONOSCOPY WITH BIOPSY - FLEXIBLE N/A 04/17/2023    Performed by Chipper Neas, MD at [facility] ENDO    ESOPHAGOGASTRODUODENOSCOPY WITH SPECIMEN COLLECTION BY BRUSHING/ WASHING N/A 07/21/2023    Performed by Arlan Nancyann PARAS, MD at [facility] ENDO    ANTEGRADE UROGRAPHY Right 09/22/2023    Performed by Erskin Alm LABOR, MD at [facility] OR    RETROGRADE UROGRAPHY WITH/ WITHOUT KUB Right 09/22/2023    Performed by Erskin Alm LABOR, MD at [facility] OR    REMOVAL, NEPHROSTOMY TUBE, WITH FLUOROSCOPIC GUIDANCE Right 09/22/2023    Performed by Erskin Alm LABOR, MD at [facility] OR    CYSTOURETHROSCOPY WITH URETEROSCOPY WITH TREATMENT URETERAL STRICTURE Right 09/22/2023    Performed by Erskin Alm LABOR, MD at [facility] OR    CYSTOURETHROSCOPY WITH URETEROSCOPY AND/ OR PYELOSCOPY - DIAGNOSTIC Right 11/17/2023    Performed by Erskin Alm LABOR, MD at [facility] OR    RETROGRADE UROGRAPHY WITH/ WITHOUT KUB Right 11/17/2023    Performed by Erskin Alm LABOR, MD at [facility] OR    CYSTOURETHROSCOPY WITH REMOVAL FOREIGN BODY/ CALCULUS/ STENT FROM URETHRA/ BLADDER - SIMPLE Right 11/17/2023    Performed by Erskin Alm LABOR, MD at [facility] OR    CATHETER IMPLANT/REVISION      PD cath    CESAREAN SECTION  1991    HX HYSTERECTOMY      still has ovaries    HX LITHOTRIPSY      HX TUBAL LIGATION  1995     Family History   Problem Relation Name Age of Onset    Diabetes Mother Mom     Hypertension Mother Mom     Diabetes Father Lukemia     Hypertension Father Lukemia     Cancer-Hematologic Father Lukemia     Cancer Father Lukemia     Diabetes Sister Macario     Migraines Sister Macario     Cancer Sister Macario         Endometrial     Social History     Tobacco Use    Smoking status: Never    Smokeless tobacco: Never   Vaping Use    Vaping status: Never Used   Substance and Sexual Activity    Alcohol use: Not Currently    Drug use: Never    Sexual activity: Not Currently     Partners: Male      Vaping/E-liquid Use    Vaping Use Never User                 Immunizations (includes history and patient reported):   Immunization History   Administered Date(s) Administered    COVID-19 (MODERNA), mRNA vacc, 100 mcg/0.5 mL (PF) 10/16/2019, 11/13/2019, 01/20/2021           Allergies:  Patient has no known allergies.    Medications     Medications:  Facility-Administered Medications Prior to Admission   Medication Dose Frequency    darbepoetin alfa (ARANESP) (*) injection 100 mcg  100 mcg Q7 Days     Medications Prior to Admission   Medication Sig    aspirin  EC (ASPIR-LOW) 81 mg tablet Take one tablet by mouth daily. Take with food.    blood sugar diagnostic (ONETOUCH VERIO TEST STRIPS) test strip Use one strip as directed before meals and at bedtime.  ICD-10: Type II Diabetes with unspecified complications; with long term insulin  use E11.8, Z79.4  Indications: type 2 diabetes mellitus    CALCIUM  PO Take 1 tablet by mouth daily.    cephalexin  (KEFLEX ) 250 mg capsule Take one capsule by mouth twice daily for 10 days. Indications: genitourinary tract infections    CHOLEcalciferoL  (vitamin D3) 50 mcg (2,000 unit) capsule TAKE 1 CAPSULE BY MOUTH EVERY DAY    DEXCOM G7 SENSOR sensor device Use one each as directed every 10 days. Indications: type 2 diabetes mellitus    everolimus  (immunosuppressive) (ZORTRESS ) 0.5 mg tablet Take one tablet by mouth every evening. Take along with 1 mg tablet for a total everolimus  dose of 1 mg every morning and 0.5 mg every evening.  Indications: prevent kidney transplant rejection    everolimus  (immunosuppressive) (ZORTRESS ) 1 mg tablet Take one tablet by mouth every morning. Take along with 0.5 mg tablet for a total everolimus  dose of 1 mg every morning and 0.5 mg every evening.  Indications: prevent kidney transplant rejection, Z94.0    ferrous sulfate  (FEOSOL) 325 mg (65 mg iron ) tablet Take one tablet by mouth at bedtime daily. Take on an empty stomach at least 1 hour before or 2 hours after food.    glucose (DEX4 GLUCOSE) 4 gram chewable tablet Chew four tablets by mouth as Needed. Indications: low blood sugar    insulin  aspart (U-100) (NOVOLOG  FLEXPEN U-100 INSULIN ) 100 unit/mL (3 mL) PEN Inject 10 units subcutaneously with breakfast, 8 units with lunch, and 10 units with evening meal PLUS sliding scale If blood sugar 151-200: Take extra 2 unit. If blood sugar 201-250: Take extra 4 units. If blood sugar 251-300: Take extra 6 units. If blood sugar is 301-350: Take extra 8 units. If blood sugars > 350: Take extra 10 units. Max daily dose 45 units.  Indications: type 2 diabetes mellitus    insulin  degludec (TRESIBA FLEXTOUCH U-100) 100 unit/mL (3 mL) subcutaneous PEN Inject sixty Units under the skin at bedtime daily.    insulin  glargine (LANTUS  SOLOSTAR U-100 INSULIN ) 100 unit/mL (3 mL) subcutaneous PEN Inject fifty Units under the skin at bedtime daily. Indications: type 2 diabetes mellitus    lancets 33 gauge (ONETOUCH DELICA 33 GUAGE) 33 gauge Use one each as directed four times daily. ICD-10: Type II Diabetes with unspecified complications; with long term insulin  use E11.8, Z79.4    pen needle, diabetic (BD ULTRA-FINE NANO PEN NEEDLE) 32 gauge x 5/32 pen needle Use one each as directed as Needed. Use with insulin  injections.    rosuvastatin  (CRESTOR ) 20 mg tablet Take one tablet by mouth daily.    tacrolimus  (PROGRAF ) 1 mg capsule Take one capsule by mouth twice daily. Indications: prevent kidney transplant rejection    tirzepatide  (MOUNJARO ) 7.5 mg/0.5 mL injector PEN Inject 0.5 mL under the skin every 7 days. Indications: type 2 diabetes mellitus       Review of Systems and Examination     Review of Systems:  Negative except as noted in HPI    Physical Exam:  Vital Signs: Last Filed In 24 Hours Vital Signs: 24 Hour Range   BP: 144/87 (08/18 1830)  Temp: 36.6 ?C (97.8 ?F) (08/18 1830)  Pulse: 85 (08/18 1830)  Respirations: 15 PER MINUTE (08/18 1830)  SpO2: 100 % (08/18 1830)  O2 Device: None (Room air) (08/18 1830)  Height: 172.7 cm (5' 8) (08/18 1835) BP: (144)/(87)   Temp:  [36.6 ?C (97.8 ?F)]   Pulse:  [85]  Respirations:  [15 PER MINUTE]   SpO2:  [100 %]   O2 Device: None (Room air)          Physical Exam  Constitutional:       Appearance: Normal appearance.   HENT:      Head: Normocephalic and atraumatic.   Eyes:      Conjunctiva/sclera: Conjunctivae normal.   Cardiovascular:      Rate and Rhythm: Normal rate and regular rhythm.      Heart sounds: Normal heart sounds.   Pulmonary:      Effort: Pulmonary effort is normal.      Breath sounds: Normal breath sounds.   Abdominal:      General: Abdomen is flat.      Palpations: Abdomen is soft.   Musculoskeletal:         General: No swelling, tenderness or deformity.   Skin:     General: Skin is warm and dry.   Neurological:      General: No focal deficit present.      Mental Status: She is alert and oriented to person, place, and time.   Psychiatric:         Mood and Affect: Mood normal.         Behavior: Behavior normal.         Labs and Diagnostic Imaging       Lab/Radiology/Other Diagnostic Tests:  No results found for this visit on 03/18/24 (from the past 48 hours).        Pertinent radiology reviewed.         Dictation was used to fulfill a portion of this documentation. Every effort was made to correct any grammatic errors or spelling however they may exist after extensive review. Please feel free to reach out to me for any questions or clarifications.

## 2024-03-18 NOTE — Telephone Encounter
 Pt returned missed call from [place]. Transferred call to [facility] [place] to assist.

## 2024-03-18 NOTE — Telephone Encounter
 Patient returned call- discussed need for admission. Patient able to be at [facility] around 5pm. Updated call to admitting.

## 2024-03-18 NOTE — Telephone Encounter
 Creatinine recheck from Friday 8/15 remains elevated above baseline at 6.2. Allosure 0.06. DSA neg. Transplant US  completed Friday:    IMPRESSION     1. Mild hydroureteronephrosis, decreased since September 2024.   2.  Patent renal vasculature without evidence of vascular compromise.   3.  Progressive decrease in size and cortical thickness of the transplant   kidney since immediate postoperative ultrasound in December 2021     Reviewed with Dr CHRISTELLA Bring who would like patient admitted for w/u. Attempted to call patient x2, left VM requesting call back. [facility] sent to patient requesting call back.

## 2024-03-19 ENCOUNTER — Encounter: Admit: 2024-03-19 | Discharge: 2024-03-19 | Payer: PRIVATE HEALTH INSURANCE

## 2024-03-19 ENCOUNTER — Inpatient Hospital Stay: Admit: 2024-03-19 | Discharge: 2024-03-18 | Payer: PRIVATE HEALTH INSURANCE

## 2024-03-19 LAB — URINALYSIS MICROSCOPIC REFLEX TO CULTURE
~~LOC~~ BKR RBC, UA: 2 /HPF — AB (ref 5.0–8.0)
~~LOC~~ BKR SQUAMOUS EPI CELLS: 0 /HPF — AB

## 2024-03-19 LAB — CBC AND DIFF
~~LOC~~ BKR ABSOLUTE BASO COUNT: 0 10*3/uL (ref 0.00–0.20)
~~LOC~~ BKR ABSOLUTE EOS COUNT: 0.1 10*3/uL (ref 0.00–0.45)
~~LOC~~ BKR ABSOLUTE MONO COUNT: 0.7 10*3/uL (ref 0.00–0.80)
~~LOC~~ BKR HEMATOCRIT: 24 % — ABNORMAL LOW (ref 36.0–45.0)
~~LOC~~ BKR HEMOGLOBIN: 8.2 g/dL — ABNORMAL LOW (ref 12.0–15.0)
~~LOC~~ BKR MCH: 26 pg — ABNORMAL LOW (ref 26.0–34.0)
~~LOC~~ BKR MCHC: 33 g/dL (ref 32.0–36.0)
~~LOC~~ BKR MCV: 78 fL — ABNORMAL LOW (ref 80.0–100.0)
~~LOC~~ BKR RBC COUNT: 3.3 10*6/uL — ABNORMAL LOW (ref 4.00–5.00)
~~LOC~~ BKR RBC COUNT: 3 10*6/uL — ABNORMAL LOW (ref 4.00–5.00)
~~LOC~~ BKR WBC COUNT: 9 10*3/uL (ref 4.50–11.00)
~~LOC~~ BKR WBC COUNT: 5.5 10*3/uL (ref 4.50–11.00)

## 2024-03-19 LAB — COMPREHENSIVE METABOLIC PANEL
~~LOC~~ BKR ALBUMIN: 4 g/dL (ref 3.5–5.0)
~~LOC~~ BKR ALBUMIN: 3.2 g/dL — ABNORMAL LOW (ref 3.5–5.0)
~~LOC~~ BKR ALK PHOSPHATASE: 66 U/L — ABNORMAL LOW (ref 25–110)
~~LOC~~ BKR ALK PHOSPHATASE: 57 U/L (ref 25–110)
~~LOC~~ BKR ALT: 14 U/L (ref 7–56)
~~LOC~~ BKR ALT: 10 U/L (ref 7–56)
~~LOC~~ BKR ANION GAP: 13 10*3/uL — ABNORMAL HIGH (ref 3–12)
~~LOC~~ BKR ANION GAP: 10 10*3/uL (ref 3–12)
~~LOC~~ BKR AST: 17 U/L (ref 7–40)
~~LOC~~ BKR AST: 15 U/L (ref 7–40)
~~LOC~~ BKR BLD UREA NITROGEN: 46 mg/dL — ABNORMAL HIGH (ref 7–25)
~~LOC~~ BKR BLD UREA NITROGEN: 46 mg/dL — ABNORMAL HIGH (ref 7–25)
~~LOC~~ BKR CALCIUM: 8.6 mg/dL — ABNORMAL HIGH (ref 8.5–10.6)
~~LOC~~ BKR CALCIUM: 8 mg/dL — ABNORMAL LOW (ref 8.5–10.6)
~~LOC~~ BKR CHLORIDE: 105 mmol/L — ABNORMAL LOW (ref 98–110)
~~LOC~~ BKR CO2: 21 mmol/L (ref 21–30)
~~LOC~~ BKR CO2: 21 mmol/L (ref 21–30)
~~LOC~~ BKR CREATININE: 5.6 mg/dL — ABNORMAL HIGH (ref 0.40–1.00)
~~LOC~~ BKR CREATININE: 5.4 mg/dL — ABNORMAL HIGH (ref 0.40–1.00)
~~LOC~~ BKR GLOMERULAR FILTRATION RATE (GFR): 8 mL/min — ABNORMAL LOW (ref >60–4.80)
~~LOC~~ BKR GLOMERULAR FILTRATION RATE (GFR): 9 mL/min — ABNORMAL LOW (ref >60–0.20)
~~LOC~~ BKR GLUCOSE, RANDOM: 102 mg/dL — ABNORMAL HIGH (ref 70–100)
~~LOC~~ BKR TOTAL BILIRUBIN: 0.4 mg/dL (ref 0.2–1.3)
~~LOC~~ BKR TOTAL BILIRUBIN: 0.3 mg/dL (ref 0.2–1.3)
~~LOC~~ BKR TOTAL PROTEIN: 7.7 g/dL (ref 6.0–8.0)
~~LOC~~ BKR TOTAL PROTEIN: 6.1 g/dL — ABNORMAL LOW (ref 6.0–8.0)

## 2024-03-19 LAB — POC GLUCOSE
~~LOC~~ BKR POC GLUCOSE: 100 mg/dL (ref 70–100)
~~LOC~~ BKR POC GLUCOSE: 105 mg/dL — ABNORMAL HIGH (ref 70–100)
~~LOC~~ BKR POC GLUCOSE: 120 mg/dL — ABNORMAL HIGH (ref 70–100)
~~LOC~~ BKR POC GLUCOSE: 177 mg/dL — ABNORMAL HIGH (ref 70–100)
~~LOC~~ BKR POC GLUCOSE: 185 mg/dL — ABNORMAL HIGH (ref 70–100)

## 2024-03-19 LAB — ECG 12-LEAD
P AXIS: 33 degrees
P-R INTERVAL: 182 ms
Q-T INTERVAL: 406 ms
QRS DURATION: 94 ms
QTC CALCULATION (BAZETT): 462 ms
R AXIS: 65 degrees
T AXIS: 67 degrees
VENTRICULAR RATE: 78 {beats}/min

## 2024-03-19 LAB — URINALYSIS DIPSTICK REFLEX TO CULTURE
~~LOC~~ BKR NITRITE: NEGATIVE
~~LOC~~ BKR URINE BILE: NEGATIVE
~~LOC~~ BKR URINE KETONE: NEGATIVE

## 2024-03-19 LAB — MAGNESIUM
~~LOC~~ BKR MAGNESIUM: 2 mg/dL — ABNORMAL LOW (ref 1.6–2.6)
~~LOC~~ BKR MAGNESIUM: 1.9 mg/dL — ABNORMAL HIGH (ref 1.6–2.6)

## 2024-03-19 LAB — CREATININE-URINE RANDOM: ~~LOC~~ BKR UR CREATININE, RAN: 95 mg/dL

## 2024-03-19 LAB — SODIUM-URINE RANDOM: ~~LOC~~ BKR UR SODIUM, RAN: 45 mmol/L

## 2024-03-19 MED ORDER — LACTATED RINGERS IV BOLUS
500 mL | Freq: Once | INTRAVENOUS | 0 refills | Status: CN
Start: 2024-03-19 — End: ?

## 2024-03-19 MED ORDER — POTASSIUM CHLORIDE 20 MEQ PO TBTQ
20 meq | Freq: Once | ORAL | 0 refills | Status: CP
Start: 2024-03-19 — End: ?
  Administered 2024-03-19: 19:00:00 20 meq via ORAL

## 2024-03-19 MED ORDER — LACTATED RINGERS IV BOLUS
500 mL | Freq: Once | INTRAVENOUS | 0 refills | Status: CP
Start: 2024-03-19 — End: ?
  Administered 2024-03-19: 22:00:00 500 mL via INTRAVENOUS

## 2024-03-19 MED ORDER — INSULIN ASPART 100 UNIT/ML SC FLEXPEN
6 [IU] | Freq: Three times a day (TID) | SUBCUTANEOUS | 0 refills | Status: DC
Start: 2024-03-19 — End: 2024-03-20

## 2024-03-19 MED ORDER — ROSUVASTATIN 5 MG PO TAB
10 mg | Freq: Every day | ORAL | 0 refills | Status: DC
Start: 2024-03-19 — End: 2024-03-20
  Administered 2024-03-20: 14:00:00 10 mg via ORAL

## 2024-03-19 MED ORDER — HEPARIN, PORCINE (PF) 5,000 UNIT/0.5 ML IJ SYRG
5000 [IU] | SUBCUTANEOUS | 0 refills | Status: DC
Start: 2024-03-19 — End: 2024-03-20
  Administered 2024-03-19 – 2024-03-20 (×2): 5000 [IU] via SUBCUTANEOUS

## 2024-03-19 MED ORDER — EPOETIN ALFA-EPBX 10,000 UNIT/ML IJ SOLN
10000 [IU] | SUBCUTANEOUS | 0 refills | Status: DC
Start: 2024-03-19 — End: 2024-03-20

## 2024-03-19 NOTE — Consults
 Transplant & Immunocompromised  Infectious Diseases Consult    Today's Date:  03/19/2024  Admission Date: 03/18/2024  Hospital day: 1    Reason for Consultation: s/p renal transplant, admitted for AKI, recent pyelo and now growing yeast in urine.     Assessment:   Jacqueline Shields is a 58 y.o. 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  Mild hydroureteronephrosis, decreased since September 2024. 2.  Patent renal vasculature without evidence of vascular compromise. 3.  Progressive decrease in size and cortical thickness of the transplant kidney since immediate postoperative ultrasound in December 2021.     Yeast on urinalysis   Recurrent UTI/pyelonephritis  Asymptomatic bacteruria   - 04/25/23 urine culture E faecalis, no antibiotics prescribed   - 07/24/23 urine culture E coli, asymptomatic, no antibiotics prescribed   - 09/07/23 urine culture E coli and MSSA,no sx documented, treated with TMP/SMX DS daily x 7 days  - 10/03/23 urine culture E coli, no sx documented, treated with cephalexin  250mg  BID x 7 days   - 01/22/24 admission at [facility] with R flank pain, treated for UTI  - 03/07/24 patient called clinic with dysuria and urinary frequency   - 03/08/24 UA with packed WBC, 3+ LE, neg nitrite, urine culture GBS   - 03/11/24 started on cephalexin  250mg  PO BID x 10 days   - 03/18/24 admitted for AKI   - 03/18/24 UA with packed WBC, 3+ LE, neg nitrite, many yeast, culture pending     Transplant ureteral stricture   - 04/19/23 IR placement of PCN   - 09/22/23 PCNU removed, dilation of ureteral stricture and placement of ureteral stent   - 11/17/23 ureteral stent removed   - 02/13/24 started on tamsulosin  for urinary incontinence, stopped 03/07/24 due to worsening incontinence     Urinary incontinence     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    Recommendations:     Patient admitted with AKI on CKD. She has had two recent UTIs and is currently completing a treatment course for GBS UTI with cephalexin . She currently does not have any UTI symptoms. Her urinalysis showed yeast on microscopy, but culture is pending. This may be due to her recent antibiotic exposure. She does not have any urologic hardware in place. Since she does not have symptoms, would not recommend any treatment at this point. If yeast is identified, could consider CT AP to evaluate for complications (eg, fungal ball, abscess) although overall low suspicion at this time.     Continue cephalexin  250mg  PO BID  Monitor off antifungals  Follow up urine culture   Monitor CBC/diff, CMP for antimicrobial toxicities  Discussed with primary team    ID will follow  Total Time Today was 80 minutes in the following activities: Preparing to see the patient, Obtaining and/or reviewing separately obtained history, Performing a medically appropriate examination and/or evaluation, Counseling and educating the patient/family/caregiver, Referring and communication with other health care professionals (when not separately reported), Documenting clinical information in the electronic or other health record, and Independently interpreting results (not separately reported) and communicating results to the patient/family/caregiver      [name], M.D.  Assistant Professor of Medicine  Section of Infectious Diseases    Contact via Voalte      History of Present Illness    Jacqueline Shields is a 58 y.o. 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     Patient was admitted on 8/18 after outpatient labs showed AKI (Cr 6.23, baseline ~3-4). She reports that over the past 2 months, she has been feeling more weak and fatigued. In late June, she was treated at an OSH ER for a UTI after she presented with severe flank pain (she does not recall details of culture or antibiotic used). She called the clinic on 8/7 with symptoms of dysuria and urinary frequency. Urine culture grew GBS and she was started on a 10 day course of cephalexin  which she continues to take.    On interview, the patient does not recall having any UTI symptoms when she was started on the cephalexin . She reports her primary concerns are weakness and fatigue. She has also been experiencing urinary incontinence since her ureteral stent was removed 10/2023. In review of prior notes, she also had concern for urinary incontinence after her urologic procedure in February. She describes the incontinence as happening without warning. She denies fevers, chills, sweats, abdominal pain, nausea, vomiting, dysuria, hematuria, urinary frequency or urgency. She denies other UTIs in the past year although it appears she has been treated for other positive urine cultures (09/2023, 09/2023) with unclear symptoms during those episodes.     Past Medical History     Past Medical History:    Abnormal biopsy of kidney    Anemia    Chronic kidney disease    Coronary artery disease due to lipid rich plaque    DM (diabetes mellitus), type 2 (CMS-HCC)    ESRD (end stage renal disease) (CMS-HCC) Gastroparesis    GERD (gastroesophageal reflux disease)    H/O kidney transplant    HLD (hyperlipidemia)    Hypertension    Kidney failure    Kidney stones    MGUS (monoclonal gammopathy of unknown significance)    Obesity    Proteinuria    PUD (peptic ulcer disease)    PVD (peripheral vascular disease)       Past Surgical History     Surgical History:   Procedure Laterality Date    ANGIOGRAPHY CORONARY ARTERY WITH LEFT HEART CATHETERIZATION N/A 07/03/2020    Performed by Gupta, Kamal, MD at [facility] CATH LAB    POSSIBLE PERCUTANEOUS CORONARY STENT PLACEMENT WITH ANGIOPLASTY N/A 07/03/2020    Performed by Charlanne Francois, MD at [facility] CATH LAB    ALLOTRANSPLANTATION KIDNEY FROM NON LIVING DONOR WITHOUT RECIPIENT NEPHRECTOMY N/A 07/15/2020    Performed by Terrie Sieving, MD at [facility] OR    REMOVAL TUNNELED INTRAPERITONEAL CATHETER Bilateral 08/24/2020    Performed by Terrie Sieving, MD at [facility] OR    CYSTOURETHROSCOPY WITH REMOVAL FOREIGN BODY/ CALCULUS/ STENT FROM URETHRA/  BLADDER - SIMPLE Right 08/24/2020    Performed by Carolynn Ruther ORN, MD at [facility] OR    ESOPHAGOGASTRODUODENOSCOPY WITH BIOPSY - FLEXIBLE N/A 04/17/2023    Performed by Chipper Neas, MD at [facility] ENDO    COLONOSCOPY WITH BIOPSY - FLEXIBLE N/A 04/17/2023    Performed by Chipper Neas, MD at [facility] ENDO    ESOPHAGOGASTRODUODENOSCOPY WITH SPECIMEN COLLECTION BY BRUSHING/ WASHING N/A 07/21/2023    Performed by Arlan Nancyann PARAS, MD at [facility] ENDO    ANTEGRADE UROGRAPHY Right 09/22/2023    Performed by Erskin Alm LABOR, MD at [facility] OR    RETROGRADE UROGRAPHY WITH/ WITHOUT KUB Right 09/22/2023    Performed by Erskin Alm LABOR, MD at [facility] OR    REMOVAL, NEPHROSTOMY TUBE, WITH FLUOROSCOPIC GUIDANCE Right 09/22/2023    Performed by Erskin Alm LABOR, MD at [facility] OR    CYSTOURETHROSCOPY WITH URETEROSCOPY WITH TREATMENT URETERAL STRICTURE Right 09/22/2023    Performed by Erskin Alm LABOR, MD at [facility] OR    CYSTOURETHROSCOPY WITH URETEROSCOPY AND/ OR PYELOSCOPY - DIAGNOSTIC Right 11/17/2023 Performed by Erskin Alm LABOR, MD at [facility] OR    RETROGRADE UROGRAPHY WITH/ WITHOUT KUB Right 11/17/2023    Performed by Erskin Alm LABOR, MD at [facility] OR    CYSTOURETHROSCOPY WITH REMOVAL FOREIGN BODY/ CALCULUS/ STENT FROM URETHRA/ BLADDER - SIMPLE Right 11/17/2023    Performed by Erskin Alm LABOR, MD at [facility] OR    CATHETER IMPLANT/REVISION      PD cath    CESAREAN SECTION  1991    HX HYSTERECTOMY      still has ovaries    HX LITHOTRIPSY      HX TUBAL LIGATION  1995       Social History   Social History[1]    Family History     Family History   Problem Relation Name Age of Onset    Diabetes Mother Mom     Hypertension Mother Mom     Diabetes Father Lukemia     Hypertension Father Lukemia     Cancer-Hematologic Father Lukemia     Cancer Father Lukemia     Diabetes Sister Macario     Migraines Sister Macario     Cancer Sister Macario         Endometrial     Allergies   Allergies[2]        Medications   Scheduled Meds:cephalexin  (KEFLEX ) capsule 250 mg, 250 mg, Oral, BID  everolimus  (immunosuppressive) (ZORTRESS ) tablet 0.5 mg, 0.5 mg, Oral, QDAY(21)  everolimus  (immunosuppressive) (ZORTRESS ) tablet 1 mg, 1 mg, Oral, QAM8  ferrous sulfate  (FEOSOL) tablet 325 mg, 325 mg, Oral, QHS  heparin  (porcine) PF syringe 5,000 Units, 5,000 Units, Subcutaneous, Q8H  insulin  aspart (U-100) (NOVOLOG  FLEXPEN U-100 INSULIN ) injection PEN 0-6 Units, 0-6 Units, Subcutaneous, ACHS (22)  insulin  aspart (U-100) (NOVOLOG  FLEXPEN U-100 INSULIN ) injection PEN 6 Units, 6 Units, Subcutaneous, TID w/ meals  insulin  glargine (LANTUS  SOLOSTAR U-100 INSULIN ) injection PEN 20 Units, 20 Units, Subcutaneous, QHS(22)  [START ON 03/20/2024] rosuvastatin  (CRESTOR ) tablet 10 mg, 10 mg, Oral, QDAY  tacrolimus  (PROGRAF ) capsule 1 mg, 1 mg, Oral, BID    Continuous Infusions:  PRN and Respiratory Meds:dextrose  50% (D50) IV PRN, melatonin QHS PRN, polyethylene glycol 3350  QDAY PRN, sennosides-docusate sodium  QDAY PRN      Physical Examination                          Vital Signs: Last  Vital Signs: 24 Hour Range   BP: 115/63 (08/19 1409)  Temp: 36.3 ?C (97.4 ?F) (08/19 1208)  Pulse: 81 (08/19 1409)  Respirations: 16 PER MINUTE (08/19 1208)  SpO2: 100 % (08/19 1208)  O2 Device: None (Room air) (08/19 1208)  Height: 172.7 cm (5' 8) (08/18 1835) BP: (104-158)/(59-89)   Temp:  [36.3 ?C (97.4 ?F)-36.7 ?C (98 ?F)]   Pulse:  [72-85]   Respirations:  [15 PER MINUTE-18 PER MINUTE]   SpO2:  [100 %]   O2 Device: None (Room air)     General -- Well-developed, alert, in no acute distress  Eyes --No conjunctival injection or subconjunctival hemorrhage, sclera nonicteric, EOM rossly intact  Respiratory -- Clear to auscultation bilaterally. No rhonchi, rales, wheezes. Non-labored respirations.  Cardiovascular -- Normal rate, regular rhythm, no murmurs. No peripheral edema.  Abdomen/gastrointestinal -- Bowel sounds are normal. Soft, nontender, nondistended. No allograft tenderness or suprapubic tenderness. No CVAT.    Musculoskeletal --  Normal bulk and tone throughout.  Neurologic --  Alert and oriented. Cranial nerves II-XII grossly intact.   Psychiatric -- Mood and affect are normal. Answering questions appropriately.  Skin --  No jaundice, erythema, rashes or ecchymoses.    Lab Review   Hematology  Recent Labs     03/18/24  2117 03/19/24  0427   WBC 9.00 5.50   HGB 8.7* 8.2*   HCT 25.7* 24.3*   PLTCT 236 196     Chemistry  Recent Labs     03/18/24  2117 03/19/24  0427   NA 139 141   K 3.3* 3.1*   CL 105 110   CO2 21 21   BUN 46* 46*   CR 5.63* 5.41*   GFR 8* 9*   GLU 102* 138*   CA 8.6 8.0*   ALBUMIN 4.0 3.2*   ALKPHOS 66 57   AST 17 15   ALT 14 10   TOTBILI 0.4 0.3       Microbiology, Radiology and other Diagnostics Review   Microbiology data were personally reviewed.    Pertinent radiology images were personally viewed.    Geni LITTIE Standing, MD              [1]   Social History  Socioeconomic History    Marital status: Married     Spouse name: Sharolyn    Number of children: 2 Tobacco Use    Smoking status: Never    Smokeless tobacco: Never   Vaping Use    Vaping status: Never Used   Substance and Sexual Activity    Alcohol use: Not Currently    Drug use: Never    Sexual activity: Not Currently     Partners: Male   Social History Narrative    Lives in [place]. Works at [employer], puts labels on bottles.   [2] No Known Allergies

## 2024-03-19 NOTE — Case Management (ED)
 Case Management Admission Assessment    NAME:Jacqueline Shields                          MRN: [id number]             DOB:November 20, 1965          AGE: 58 y.o.  ADMISSION DATE: 03/18/2024             DAYS ADMITTED: LOS: 1 day      Today?s Date: 03/19/2024    Source of Information: Patient.    This CM met with pt for assessment on this date.  Provided contact information and explanation of SW/NCM roles.  Reviewed [facility], Preparing for Discharge, and [facility] hand-outs.  Provided opportunity for questions and discussion. Pt/family encouraged to contact Case Management team with questions and concerns during hospitalization and until patient is able to transition back to the patient's primary care physician.    Pt lives in a private home in [place], [place] (about 1 hr from [place]) with her husband, Sharolyn. Pt is independent with ADLs and able to drive. Pt works full time for [employer]. Doesn't need a work note, but has [employer]. Informed pt her attending physician or transplant nephrologist are best to complete that paperwork. Pt states understanding. This NCM informed pt's primary NCM, J.Maddox, on the above. Pt reports her husband will drive her home on DC.  Pt's PCP is Dr. Adrien Eck. Pt last saw NP in PCP office about 1 month ago. Pt also follows closely with [facility]ist Dr. Charlanne.   Pt owns a glucometer and a Dexacom Continuous Glucose Monitor.   No history of HH services, IP setting placement, home infusion or outpatient PT/OT. Prior to transplant in 2021 pt did HD and PD through [facility] (Ph:  [phone] / fax [phone]).  Pt has UHC/UMR. [facility] [place], [place]. Pt received anti-rejection medications through [facility]. Pt denies affordability concerns.    Plan  Plan: Case Management Assessment, Assist PRN with SW/NCM Services    Patient Address/Phone  [address]  [place] [address]  [phone] (home) Emergency Contact  Extended Emergency Contact Information  Primary Emergency Contact: Hoopes,ROGER  Home Phone: [phone]  Mobile Phone: [phone]  Relation: Spouse    Healthcare Directive  Healthcare Directive: No, patient does not have a healthcare directive  Would patient like to fill out a (a new) Healthcare Directive?: No, patient declined      Transportation  Does the Patient Need Case Management to Arrange Discharge Transport? (ex: facility, ambulance, wheelchair/stretcher, Medicaid, cab, other): No  Will the Patient Use Family Transport?: Yes  Transportation Name, Phone and Availability #1: Husband:  Latoyia Tecson [phone])    Expected Discharge Date  03/21/2024     Living Situation Prior to Admission  Living Arrangements  Type of Residence: Home, independent  Living Arrangements: Spouse/significant other  Financial risk analyst / Tub: Tub/Shower Unit  How many levels in the residence?: 2  Can patient live on one level if needed?: Yes (laundry in the basement)  Does residence have entry and/or inside stairs?: No  Assistance needed prior to admit or anticipated on discharge: No  Who provides assistance or could if needed?: spouse  Are they in good health?: Yes  Can support system provide 24/7 care if needed?: No  Level of Function   Prior level of function: Independent  Cognitive Abilities  Cognitive Abilities: Alert and Oriented, Participates in Radio producer Resources  Coverage  Primary Insurance: Nurse, learning disability  Secondary Insurance: No insurance  Additional Coverage: None  Medication Coverage    Medication Coverage: Commercial insurance  Have you experienced a noticeable increase in your copay costs recently?: No  Are current medications affordable?: Yes  Source of Income   Source Of Income: Employed  Financial Assistance Needed?  none    Psychosocial Needs  Mental Health  Mental Health History: No  Substance Use History  Substance Use History Screen: No  Other  none    Current/Previous Services  PCP  Francis Lew, [phone], [phone]  Pharmacy    [facility]  [address]  [place] [address]  Phone: [phone] Fax: [phone]    [id number] [place] [place] - [address]  [address] [place]  [place] [address]  Phone: [phone] Fax: [phone]    Durable Medical Equipment   Durable Medical Equipment at home: Other (comment) (glucometer - Dexcom Continuous Glucose Monitor)  Home Health  Receiving home health: No  Hemodialysis or Peritoneal Dialysis  Undergoing hemodialysis or peritoneal dialysis: No (Prior to transplant pt did HD/PD through [facility].)  Tube/Enteral Feeds  Receive tube/enteral feeds: No  Infusion  Receive infusions: No  Private Duty  Private duty help used: No  Home and Community Based Services  Home and community based services: No  Ryan White  Ryan White: N/A  Hospice  Hospice: No  Outpatient Therapy  PT: No  OT: No  SLP: No  Skilled Nursing Facility/Nursing Home  SNF: No  NH: No  Inpatient Rehab  IPR: No  Long-Term Acute Care Hospital  LTACH: No  Acute Hospital Stay  Acute Hospital Stay: In the past  Was patient's stay within the last 30 days?: No      Rea Islam, BSN, RN  Nurse Case Manager   Available on [place]  Phone:  [phone]  Pager:  [phone]

## 2024-03-19 NOTE — Progress Notes
 PHYSICAL THERAPY  ASSESSMENT  / DISCHARGE    Name: Jacqueline Shields   MRN: [id number]     DOB: Dec 12, 1965      Age: 58 y.o.  Admission Date: 03/18/2024     LOS: 1 day     Date of Service: 03/19/2024      Subjective  Significant Hospital Events: 58 y.o. 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  Mental / Cognitive: Alert;Cooperative;Follows commands  Pain: No complaint of pain    Home Living Situation  Lives With: Spouse/significant other  Entry Stairs: No stairs  In-Home Stairs: Able to live on main level  Patient Owned Equipment: None    Prior Level of Function  Level Of Independence: Independent with ADL and community mobility without device  Comments: Patiend reports she has been having episodes of dizziness for the last month. She describes the incidents as feeling as if her ears are full, such as when you go into altitude. She says it comes on suddenly and will go away once she is able to get her ears to pop. During the episode, she feels dizzy and as if she might pass out, although she never has. She generally tries to sit or lie down until the symptoms go away. The episodes come on randomly, sometimes when she first gets up and sometimes when she has been up moving around for quite awhile. There are no particular movements that make the feeling better or worse.    ROM  Cervical ROM: WFL  Cervical ROM Method: Active    Strength  Overall Strength: WFL    Vestibular & Vision Screen  Vestibular & Vision Screen: Completed  Visual Fields: Intact  Smooth Pursuits: Intact  Visual Fixation: Intact  Saccades: Intact  Eye Alignment: Intact  Visual Acuity: Intact  Vestibulo-ocular reflex (VOR) Head Impulse Test: Normal  Orthostatic Hypotension: Negative    Vestibular Assessment  Hx of Vertebral Artery Stenosis and/or Cervical Artery Stenosis: No  Concussion or Cervical Spine Trauma Hx: No  Head/Neck Imaging: No  Visual Changes: No changes  Hearing Changes: Aural fullness  Symptom Onset Date: 1 month  Symptom Duration : Minutes  Dizziness During Session: 0/10  Dizziness Post Session: 0/10    Gait  Comments: Patient has been ambulating independently. She reports no issues with ambulation or other mobility.    Education  Persons Educated: Patient  Patient Barriers To Learning: None Noted  Teaching Methods: Verbal Instruction  Patient Response: Verbalized Understanding  Topics: Plan/Goals of PT Interventions;Safety Awareness    Assessment/Progress  Assessment/Progress: Patient current status and level of safety suggests progression of activity can be achieved with nursing and/or family and does not require physical therapist intervention    Plan  Plan Frequency: No Further Treatment    PT Discharge Recommendations  Recommendation: Home/prior living situation  Comments: Patient's dizziness does not appear to be related to an etiology that can be addressed with physical therapy. She may benefit from an ENT consult due to her primary symptom being aural fullness.      Therapist  Lauraine Moose, PT  Date  03/19/2024

## 2024-03-19 NOTE — Progress Notes
 [facility] END OF SHIFT/ JHFRAT NOTE    Admission Date: 03/18/2024  Length of Stay: LOS: 1 day    Acute events, interventions, provider communication: pt NPO since midnight. Approval from resident Dr. Nat Lux for pt to be medsurg status. Orthostatic vital signs completed.     Patient Interventions and Education  Fall Risk/JHFRAT Interventions and Education: (Charting when applicable)   Total Fall Risk Score: Total Fall Risk Score: 3.   Elimination Interventions : N/A  Medications : Educate patient on medication side effects  Patient Care Equipment: Ensure environment is free of clutter and walkways are clear from tripping hazards  Mobility: N/A  Cognition: N/A  Risk for Moderate/Major Injury: N/A    Restraints:  No     Restraint Goal: Patient will be free from injury while physically restrained.  See Docflowsheet for restraint documentation, interventions, education, etc.    Hygiene:                 Urinary Catheter / Perineal Care: Self    Intake and Output:           Last Bowel Movement Date:  (pta),           Date 03/18/24 0701 - 03/19/24 0700 03/19/24 0701 - 03/20/24 0700   Shift 0701-1900 1901-0700 24 Hour Total 0701-1900 1901-0700 24 Hour Total   INTAKE   P.O. 0  0      Shift Total(mL/kg) 0(0)  0(0)      OUTPUT   Urine(mL/kg/hr)  100 100        Urine  100 100      Shift Total(mL/kg)  100(1.1) 100(1.1)      NET 0 -100 -100      Weight (kg) 93.5 93.5 93.5 93.5 93.5 93.5

## 2024-03-19 NOTE — Progress Notes
 Profile updated, care plan/education reviewed, and call light within reach.

## 2024-03-19 NOTE — Progress Notes
 OCCUPATIONAL THERAPY  NOTE   Name: Jacqueline Shields   MRN: [id number]     DOB: 06/13/1966      Age: 58 y.o.  Admission Date: 03/18/2024     LOS: 1 day     Date of Service: 03/19/2024    Per discussion with PT, no concerns with patient completing self-care activities with no OT goals identified. OT will discontinue service, please re-consult if the patient has a decline in functional status.     Therapist: Karlene Southard, OT  Date: 03/19/2024

## 2024-03-19 NOTE — Care Coordination-Inpatient
 Med Teaching TBD [phone] will take calls for this patient till 8 AM.     Afterwards, kindly direct questions and concerns to the First on Call for designated service/team listed in chart   (Voalte is the preferred way of communication).    Deatrice Payton Bathe, MD  AOD- Hospitalist.

## 2024-03-19 NOTE — Case Management (ED)
 Case Management Progress Note    NAME:Jacqueline Shields                          MRN: [id number]              DOB:05/28/1966          AGE: 58 y.o.  ADMISSION DATE: 03/18/2024             DAYS ADMITTED: LOS: 1 day      Today's Date: 03/19/2024    PLAN:  Anticipate pt will return home ([place], [place]) with husband when medically stable. DC needs undetermined at this time.    Expected Discharge Date: 03/21/2024   Is Patient Medically Stable: No, Please explain: consult transplant nephrology, consult ID, monitor labs  Are there Barriers to Discharge? no    INTERVENTION/DISPOSITION:  Discharge Planning              Discharge Planning: No Needs Identified    Transportation              Does the Patient Need Case Management to Arrange Discharge Transport? (ex: facility, ambulance, wheelchair/stretcher, Medicaid, cab, other): No  Will the Patient Use Family Transport?: Yes  Transportation Name, Phone and Availability #1: Husband:  Jacqueline Shields [phone])    Support              Support: No needs identified    Info or Referral              Information or Referral to [facility]: No Needs Identified    Positive SDOH Domains and Potential Barriers                   Medication Needs              Medication Needs: No Needs Identified                          Financial              Financial: No Needs Identified    Legal              Legal: DPOA & Advance Directives   - Pt wished to complete DPOA at this time. Pt alert and oriented x4 and of sound mind.   - This NCM and witness, C.Gerke, witnessed pt appoint the following agents:    Agent One:  Jacqueline Shields (Husband) / [phone] / [address] [place], [place] [id number]    Agent Two:  Jacqueline Shields (Daughter) / [phone]   - Provided pt with 3 copies. One copy placed in [place]. One copy emailed to [email or web address].     Other              Other/None: No needs identified    Discharge Disposition                                 Selected Continued Care - Admitted Since 03/18/2024    No services have been selected for the patient.       Rea Islam, BSN, RN  Nurse Case Manager   Available on [place]  Phone:  [phone]  Pager:  [phone]

## 2024-03-19 NOTE — Progress Notes
 [facility] END OF SHIFT/ JHFRAT NOTE    Admission Date: 03/18/2024  Length of Stay: LOS: 1 day    Acute events, interventions, provider communication: N/A    Patient Interventions and Education  Fall Risk/JHFRAT Interventions and Education: (Charting when applicable)   Total Fall Risk Score: Total Fall Risk Score: 8.   Elimination Interventions : N/A  Medications : Educate patient on medication side effects  Patient Care Equipment: N/A  Mobility: N/A  Cognition: N/A  Risk for Moderate/Major Injury: N/A    Restraints:  No     Restraint Goal: Patient will be free from injury while physically restrained.  See Docflowsheet for restraint documentation, interventions, education, etc.    Hygiene:     Bath/Shower: Refused (pt wants to shower later)           Urinary Catheter / Perineal Care: Self    Intake and Output:           Last Bowel Movement Date:  (PTA),           Date 03/18/24 0701 - 03/19/24 0700 03/19/24 0701 - 03/20/24 0700   Shift 0701-1900 1901-0700 24 Hour Total 0701-1900 1901-0700 24 Hour Total   INTAKE   P.O. 0  0      Shift Total(mL/kg) 0(0)  0(0)      OUTPUT   Urine(mL/kg/hr)  100(0.1) 100(0)        Urine  100 100      Other           Urine Occurrence     1 x  1 x   Shift Total(mL/kg)  100(1.1) 100(1.1)      NET 0 -100 -100      Weight (kg) 93.5 93.5 93.5 93.5 93.5 93.5

## 2024-03-20 ENCOUNTER — Encounter: Admit: 2024-03-20 | Discharge: 2024-03-20 | Payer: PRIVATE HEALTH INSURANCE

## 2024-03-20 ENCOUNTER — Inpatient Hospital Stay
Admit: 2024-03-18 | Discharge: 2024-03-20 | Disposition: A | Discharge status: Home / Self Care | Payer: PRIVATE HEALTH INSURANCE

## 2024-03-20 DIAGNOSIS — Z87442 Personal history of urinary calculi: Secondary | ICD-10-CM

## 2024-03-20 DIAGNOSIS — K219 Gastro-esophageal reflux disease without esophagitis: Secondary | ICD-10-CM

## 2024-03-20 DIAGNOSIS — D509 Iron deficiency anemia, unspecified: Secondary | ICD-10-CM

## 2024-03-20 DIAGNOSIS — E1143 Type 2 diabetes mellitus with diabetic autonomic (poly)neuropathy: Secondary | ICD-10-CM

## 2024-03-20 DIAGNOSIS — E785 Hyperlipidemia, unspecified: Secondary | ICD-10-CM

## 2024-03-20 DIAGNOSIS — T8619 Other complication of kidney transplant: Secondary | ICD-10-CM

## 2024-03-20 DIAGNOSIS — Z794 Long term (current) use of insulin: Secondary | ICD-10-CM

## 2024-03-20 DIAGNOSIS — I2510 Atherosclerotic heart disease of native coronary artery without angina pectoris: Secondary | ICD-10-CM

## 2024-03-20 DIAGNOSIS — I2583 Coronary atherosclerosis due to lipid rich plaque: Secondary | ICD-10-CM

## 2024-03-20 DIAGNOSIS — I10 Essential (primary) hypertension: Secondary | ICD-10-CM

## 2024-03-20 DIAGNOSIS — K3184 Gastroparesis: Secondary | ICD-10-CM

## 2024-03-20 DIAGNOSIS — E1151 Type 2 diabetes mellitus with diabetic peripheral angiopathy without gangrene: Secondary | ICD-10-CM

## 2024-03-20 DIAGNOSIS — E66811 Obesity, class 1: Secondary | ICD-10-CM

## 2024-03-20 DIAGNOSIS — Z8719 Personal history of other diseases of the digestive system: Secondary | ICD-10-CM

## 2024-03-20 DIAGNOSIS — Z98891 History of uterine scar from previous surgery: Secondary | ICD-10-CM

## 2024-03-20 DIAGNOSIS — Z9851 Tubal ligation status: Secondary | ICD-10-CM

## 2024-03-20 DIAGNOSIS — Z79899 Other long term (current) drug therapy: Secondary | ICD-10-CM

## 2024-03-20 DIAGNOSIS — E1122 Type 2 diabetes mellitus with diabetic chronic kidney disease: Secondary | ICD-10-CM

## 2024-03-20 DIAGNOSIS — Z87440 Personal history of urinary (tract) infections: Secondary | ICD-10-CM

## 2024-03-20 DIAGNOSIS — Z8711 Personal history of peptic ulcer disease: Secondary | ICD-10-CM

## 2024-03-20 DIAGNOSIS — D84821 Immunodeficiency due to drugs: Secondary | ICD-10-CM

## 2024-03-20 DIAGNOSIS — Z8249 Family history of ischemic heart disease and other diseases of the circulatory system: Secondary | ICD-10-CM

## 2024-03-20 DIAGNOSIS — Z90710 Acquired absence of both cervix and uterus: Secondary | ICD-10-CM

## 2024-03-20 DIAGNOSIS — N39498 Other specified urinary incontinence: Secondary | ICD-10-CM

## 2024-03-20 DIAGNOSIS — Z7985 Long-term (current) use of injectable non-insulin antidiabetic drugs: Secondary | ICD-10-CM

## 2024-03-20 DIAGNOSIS — Z833 Family history of diabetes mellitus: Secondary | ICD-10-CM

## 2024-03-20 DIAGNOSIS — Z79621 Long term (current) use of calcineurin inhibitor: Secondary | ICD-10-CM

## 2024-03-20 DIAGNOSIS — Z7982 Long term (current) use of aspirin: Secondary | ICD-10-CM

## 2024-03-20 LAB — COMPREHENSIVE METABOLIC PANEL
~~LOC~~ BKR ALBUMIN: 3.2 g/dL — ABNORMAL LOW (ref 3.5–5.0)
~~LOC~~ BKR ALK PHOSPHATASE: 54 U/L (ref 25–110)
~~LOC~~ BKR ALT: 15 U/L — ABNORMAL LOW (ref 7–56)
~~LOC~~ BKR ANION GAP: 12 10*3/uL — ABNORMAL LOW (ref 3–12)
~~LOC~~ BKR AST: 23 U/L (ref 7–40)
~~LOC~~ BKR CO2: 19 mmol/L — ABNORMAL LOW (ref 21–30)
~~LOC~~ BKR GLOMERULAR FILTRATION RATE (GFR): 9 mL/min — ABNORMAL LOW (ref >60–0.80)
~~LOC~~ BKR TOTAL BILIRUBIN: 0.3 mg/dL (ref 0.2–1.3)

## 2024-03-20 LAB — CULTURE-URINE W/SENSITIVITY: ~~LOC~~ BKR URINE CULTURE: 10 — AB

## 2024-03-20 LAB — POC GLUCOSE
~~LOC~~ BKR POC GLUCOSE: 200 mg/dL — ABNORMAL HIGH (ref 70–100)
~~LOC~~ BKR POC GLUCOSE: 134 mg/dL — ABNORMAL HIGH (ref 70–100)

## 2024-03-20 LAB — CBC AND DIFF
~~LOC~~ BKR ABSOLUTE BASO COUNT: 0 10*3/uL (ref 0.00–0.20)
~~LOC~~ BKR ABSOLUTE EOS COUNT: 0.1 10*3/uL — ABNORMAL LOW (ref 0.00–0.45)

## 2024-03-20 MED ORDER — ROSUVASTATIN 10 MG PO TAB
20 mg | ORAL_TABLET | Freq: Every day | ORAL | 0 refills | 90.00000 days | Status: DC
Start: 2024-03-20 — End: 2024-03-20
  Filled 2024-03-20: qty 10, 10d supply, fill #1
  Filled 2024-03-20: qty 50, 50d supply, fill #0

## 2024-03-20 MED ORDER — CEPHALEXIN 250 MG PO CAP
250 mg | Freq: Two times a day (BID) | ORAL | 0 refills | 7.00000 days | Status: AC
Start: 2024-03-20 — End: ?

## 2024-03-20 MED ORDER — OXYBUTYNIN CHLORIDE 5 MG PO TAB
5 mg | ORAL_TABLET | Freq: Two times a day (BID) | ORAL | 0 refills | 12.00000 days | Status: AC | PRN
Start: 2024-03-20 — End: ?

## 2024-03-20 MED ORDER — ROSUVASTATIN 10 MG PO TAB
10 mg | ORAL_TABLET | Freq: Every day | ORAL | 0 refills | 90.00000 days | Status: AC
Start: 2024-03-20 — End: ?

## 2024-03-20 MED ORDER — OXYBUTYNIN CHLORIDE 5 MG PO TAB
5 mg | ORAL_TABLET | Freq: Two times a day (BID) | ORAL | 0 refills | 12.00000 days | Status: DC
Start: 2024-03-20 — End: 2024-03-20
  Filled 2024-03-20: qty 60, 30d supply, fill #0

## 2024-03-20 NOTE — Progress Notes
 [facility] END OF SHIFT/ JHFRAT NOTE    Admission Date: 03/18/2024  Length of Stay: LOS: 2 days    Acute events, interventions, provider communication:   Jacqueline Shields discharged on 03/20/2024.   Jacqueline Shields  Discharge instructions reviewed with patient.  Valuables returned: NA  ADL Belongings at Bedside: Eyeglasses/contacts.  Home medications: NA   .  Functional assessment at discharge complete: Yes .    Patient Interventions and Education  Fall Risk/JHFRAT Interventions and Education: (Charting when applicable)   Total Fall Risk Score: Total Fall Risk Score: 6.   Elimination Interventions : N/A  Medications : Educate patient on medication side effects  Patient Care Equipment: Does not need assistance with patient care equipment when ambulating, Ensure environment is free of clutter and walkways are clear from tripping hazards, and Assess need for patient equipment and remove if not in use  Mobility: N/A  Cognition: N/A  Risk for Moderate/Major Injury: N/A    Restraints:  No     Restraint Goal: Patient will be free from injury while physically restrained.  See Docflowsheet for restraint documentation, interventions, education, etc.    Hygiene:     Bath/Shower: Refused (pt wants to shower later)     Oral Care: Brush     Urinary Catheter / Perineal Care: Self    Intake and Output:           Last Bowel Movement Date:  (PTA),           Date 03/19/24 0701 - 03/20/24 0700 03/20/24 0701 - 03/21/24 0700   Shift 0701-1900 1901-0700 24 Hour Total 0701-1900 1901-0700 24 Hour Total   INTAKE   Shift Total(mL/kg)         OUTPUT   Other           Urine Occurrence  1 x  1 x 2 x  2 x   Shift Total(mL/kg)         NET         Weight (kg) 93.5 93.5 93.5 93.5 93.5 93.5

## 2024-03-20 NOTE — Progress Notes
 Transplant & Immunocompromised  Infectious Diseases Consult    Today's Date:  03/20/2024  Admission Date: 03/18/2024  Hospital day: 2    Reason for Consultation: s/p renal transplant, admitted for AKI, recent pyelo and now growing yeast in urine.     Assessment:   Jacqueline Shields is a 58 y.o. 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  Mild hydroureteronephrosis, decreased since September 2024. 2.  Patent renal vasculature without evidence of vascular compromise. 3.  Progressive decrease in size and cortical thickness of the transplant kidney since immediate postoperative ultrasound in December 2021.     Yeast on urinalysis   Recurrent UTI/pyelonephritis  Asymptomatic bacteruria   - 04/25/23 urine culture E faecalis, no antibiotics prescribed   - 07/24/23 urine culture E coli, asymptomatic, no antibiotics prescribed   - 09/07/23 urine culture E coli and MSSA,no sx documented, treated with TMP/SMX DS daily x 7 days  - 10/03/23 urine culture E coli, no sx documented, treated with cephalexin  250mg  BID x 7 days   - 01/22/24 admission at [facility] with R flank pain, treated for UTI  - 03/07/24 patient called clinic with dysuria and urinary frequency   - 03/08/24 UA with packed WBC, 3+ LE, neg nitrite, urine culture GBS   - 03/11/24 started on cephalexin  250mg  PO BID x 10 days   - 03/18/24 admitted for AKI   - 03/18/24 UA with packed WBC, 3+ LE, neg nitrite, many yeast, culture pending     Transplant ureteral stricture   - 04/19/23 IR placement of PCN   - 09/22/23 PCNU removed, dilation of ureteral stricture and placement of ureteral stent   - 11/17/23 ureteral stent removed   - 02/13/24 started on tamsulosin  for urinary incontinence, stopped 03/07/24 due to worsening incontinence     Urinary incontinence     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    Recommendations:     Patient admitted with AKI on CKD. She has had two recent UTIs and is currently completing a treatment course for GBS UTI with cephalexin . She currently does not have any UTI symptoms. Her urinalysis showed yeast on microscopy, but culture is pending. This may be due to her recent antibiotic exposure. She does not have any urologic hardware in place. Since she does not have symptoms, would not recommend any treatment at this point. If yeast is identified, could consider CT AP to evaluate for complications (eg, fungal ball, abscess) although overall low suspicion at this time.     Continue cephalexin  250mg  PO BID - would complete 10 day course as previously planned   Monitor off antifungals  Follow up urine culture   Monitor CBC/diff, CMP for antimicrobial toxicities  Discussed  with Dr. Fermin     ID will follow    [name], M.D.  Assistant Professor of Medicine  Section of Infectious Diseases    Contact via [employer]      History of Present Illness      Afebrile, HDS, on RA     Feeling well today. Continues to have urinary incontinence. No other urinary symptoms. Hopeful for discharge home today.     Labs reviewed - WBC 5.3, stable, Cr 5.21, improved, LFTs normal    Notes reviewed       Allergies   Allergies[1]    Medications   Scheduled Meds:cephalexin  (KEFLEX ) capsule 250 mg, 250 mg, Oral, BID  epoetin  alfa-epbx (RETACRIT ) injection 10,000 Units, 10,000 Units, Subcutaneous, Once per day on Monday Wednesday Friday  everolimus  (immunosuppressive) (ZORTRESS ) tablet 0.5 mg, 0.5 mg, Oral, QDAY(21)  everolimus  (immunosuppressive) (ZORTRESS ) tablet 1 mg, 1 mg, Oral, QAM8  ferrous sulfate  (FEOSOL) tablet 325 mg, 325 mg, Oral, QHS  heparin  (porcine) PF syringe 5,000 Units, 5,000 Units, Subcutaneous, Q8H  insulin  aspart (U-100) (NOVOLOG  FLEXPEN U-100 INSULIN ) injection PEN 0-6 Units, 0-6 Units, Subcutaneous, ACHS (22)  insulin  aspart (U-100) (NOVOLOG  FLEXPEN U-100 INSULIN ) injection PEN 6 Units, 6 Units, Subcutaneous, TID w/ meals  insulin  glargine (LANTUS  SOLOSTAR U-100 INSULIN ) injection PEN 20 Units, 20 Units, Subcutaneous, QHS(22)  rosuvastatin  (CRESTOR ) tablet 10 mg, 10 mg, Oral, QDAY  tacrolimus  (PROGRAF ) capsule 1 mg, 1 mg, Oral, BID    Continuous Infusions:  PRN and Respiratory Meds:dextrose  50% (D50) IV PRN, melatonin QHS PRN, polyethylene glycol 3350  QDAY PRN, sennosides-docusate sodium  QDAY PRN      Physical Examination                          Vital Signs: Last                  Vital Signs: 24 Hour Range   BP: 126/66 (08/20 0404)  Temp: 36.7 ?C (98.1 ?F) (08/20 0404)  Pulse: 81 (08/20 0404)  Respirations: 15 PER MINUTE (08/20 0404)  SpO2: 100 % (08/20 0404)  O2 Device: None (Room air) (08/20 0404) BP: (104-144)/(59-87)   Temp:  [36.3 ?C (97.4 ?F)-36.7 ?C (98.1 ?F)]   Pulse:  [72-82]   Respirations:  [15 PER MINUTE-16 PER MINUTE]   SpO2:  [100 %]   O2 Device: None (Room air)     General -- Well-developed, alert, in no acute distress, sitting up in chair   Eyes --No conjunctival injection or subconjunctival hemorrhage, sclera nonicteric, EOM rossly intact  Respiratory -- Non-labored respirations.  Cardiovascular -- Normal rate, regular rhythm, no murmurs. No peripheral edema.  Musculoskeletal --  Normal bulk and tone throughout.  Neurologic --  Alert and oriented. Cranial nerves II-XII grossly intact.   Psychiatric -- Mood and affect are normal. Answering questions appropriately.  Skin --  No jaundice, erythema, rashes or ecchymoses.    Lab Review   Hematology  Recent Labs     03/18/24  2117 03/19/24  0427 03/20/24  0421   WBC 9.00 5.50 5.30   HGB 8.7* 8.2* 8.2*   HCT 25.7* 24.3* 24.2*   PLTCT 236 196 194     Chemistry  Recent Labs     03/18/24  2117 03/19/24  0427 03/20/24  0421   NA 139 141 142   K 3.3* 3.1* 3.7   CL 105 110 111*   CO2 21 21 19*   BUN 46* 46* 49*  CR 5.63* 5.41* 5.21*   GFR 8* 9* 9*   GLU 102* 138* 140*   CA 8.6 8.0* 7.8*   ALBUMIN 4.0 3.2* 3.2*   ALKPHOS 66 57 54   AST 17 15 23    ALT 14 10 15    TOTBILI 0.4 0.3 0.3       Microbiology, Radiology and other Diagnostics Review   Microbiology data were personally reviewed.    Pertinent radiology images were personally viewed.    Geni LITTIE Standing, MD                [1] No Known Allergies

## 2024-03-20 NOTE — Discharge Instructions - Pharmacy
 Discharge Summary      Name: Jacqueline Shields  Medical Record Number: [id number]        Account Number:  [id number]  Date Of Birth:  1966-02-06                         Age:  58 y.o.  Admit date:  03/18/2024                     Discharge date: 03/20/2024      Discharge Attending:  Joesph Rush, MD  Discharge Summary Completed By: Joesph FORBES Rush, MD    Service: Med Private [phone]    Reason for hospitalization:  AKI (acute kidney injury) [N17.9]    Primary Discharge Diagnosis:   AKI (acute kidney injury)    Hospital Diagnoses:  Hospital Problems        Active Problems    * (Principal) AKI (acute kidney injury)     Present on Admission:   AKI (acute kidney injury)        Significant Past Medical History        Abnormal biopsy of kidney  Anemia  Chronic kidney disease  Coronary artery disease due to lipid rich plaque  DM (diabetes mellitus), type 2 (CMS-HCC)  ESRD (end stage renal disease) (CMS-HCC)  Gastroparesis  GERD (gastroesophageal reflux disease)  H/O kidney transplant  HLD (hyperlipidemia)  Hypertension  Kidney failure  Kidney stones  MGUS (monoclonal gammopathy of unknown significance)  Obesity  Proteinuria  PUD (peptic ulcer disease)  PVD (peripheral vascular disease)    Allergies   Patient has no known allergies.    Brief Hospital Course   The patient was admitted and the following issues were addressed during this hospitalization: (with pertinent details including admission exam/imaging/labs).      Jacqueline Shields is a 57 y.o. 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 Cr peaked in mid-6s outpatient. Seen by transplant nephrology inpatient with no recommendation for renal biopsy. Received fluids for mild orthostasis/dizziness in setting of recently diagnosed pyelonephritis, with improvement. She was seen by infectious disease for yeast in the urine which returned Candida glabrata. There was no concern that was a contributing factor and she was not started on antifungals. She will continue keflex  for pyelo outpatient. She has had urinary incontinence since removal of a ureteral stent and was started on prn oxybutynin  at discharge until appointment with urology.     Day of discharge exam notable for:   Gen: NAD, well-appearing  CV: RRR, no murmurs  Lungs: CTAB  Ext: no edema    Items Needing Follow Up   Pending items or areas that need to be addressed at follow up: Urology follow up, Cr monitoring    Pending Labs and Follow Up Radiology    Pending labs and/or radiology review at this time of discharge are listed below: Please note- any labs with collected status will not have a result; if this area is blank, there are no items for review.   Pending Labs       Order Current Status    CULTURE-URINE W/SENSITIVITY In process    EVEROLIMUS  BLOOD In process    IMMUNOFIXATION URINE RANDOM In process    IMMUNOFIXATION, SERUM (IFES) In process              Medications  Medication List      START taking these medications     oxyBUTYnin  chloride 5 mg tablet; Commonly known as: DITROPAN ; Dose: 5   mg; Take one tablet by mouth twice daily as needed. Indications: a   condition where the urge to urinate results in urine leakage; For: a   condition where the urge to urinate results in urine leakage; Quantity: 60   tablet; Refills: 0     CHANGE how you take these medications     rosuvastatin  10 mg tablet; Commonly known as: CRESTOR ; Dose: 10 mg; Take   one tablet by mouth daily. Indications: excessive fat in the blood; For:   excessive fat in the blood; Quantity: 60 tablet; Refills: 0; What changed:   medication strength, how much to take     CONTINUE taking these medications     aspirin  EC 81 mg tablet; Commonly known as: ASPIR-LOW; Dose: 81 mg; Take   one tablet by mouth daily. Take with food.; Quantity: 90 tablet; Refills:   3   BD ULTRA-FINE NANO PEN NEEDLE 32 gauge x 5/32 pen needle; Generic drug:   pen needle, diabetic; Dose: 1 each; Use one each as directed as Needed.   Use with insulin  injections.; Quantity: 300 each; Refills: 3   CALCIUM  PO; Dose: 1 tablet; Refills: 0   cephalexin  250 mg capsule; Commonly known as: KEFLEX ; Dose: 250 mg; Take   one capsule by mouth twice daily for 2 days. Indications: genitourinary   tract infections; For: genitourinary tract infections; Refills: 0   CHOLEcalciferoL  (vitamin D3) 50 mcg (2,000 unit) capsule; TAKE 1 CAPSULE   BY MOUTH EVERY DAY; Quantity: 90 capsule; Refills: 3   DEXCOM G7 SENSOR sensor device; Generic drug: blood-glucose sensor;   Dose: 1 each; Use one each as directed every 10 days. Indications: type 2   diabetes mellitus; For: type 2 diabetes mellitus; Quantity: 9 each;   Refills: 3   * everolimus  (immunosuppressive) 0.5 mg tablet; Commonly known as:   ZORTRESS ; Dose: 0.5 mg; Doctor's comments: Physician NPI:  [id number]                         Diagnosis code:  V42.0 & V42.83.; Take one tablet by   mouth every evening. Take along with 1 mg tablet for a total everolimus    dose of 1 mg every morning and 0.5 mg every evening.  Indications: prevent   kidney transplant rejection; For: prevent kidney transplant rejection;   Quantity: 30 tablet; Refills: 3   * everolimus  (immunosuppressive) 1 mg tablet; Commonly known as:   ZORTRESS ; Dose: 1 mg; Take one tablet by mouth every morning. Take along   with 0.5 mg tablet for a total everolimus  dose of 1 mg every morning and   0.5 mg every evening.  Indications: prevent kidney transplant rejection,   Z94.0; For: prevent kidney transplant rejection, Z94.0; Quantity: 30   tablet; Refills: 3   ferrous sulfate  325 mg (65 mg iron ) tablet; Commonly known as: FEOSOL;   Dose: 325 mg; Take one tablet by mouth at bedtime daily. Take on an empty   stomach at least 1 hour before or 2 hours after food.; Quantity: 30   tablet; Refills: 3   glucose 4 gram chewable tablet; Commonly known as: DEX4 GLUCOSE; Dose:   16 g; Chew four tablets by mouth as Needed. Indications: low blood sugar;   For: low blood sugar; Quantity: 50 tablet; Refills: 0   insulin   aspart (U-100) 100 unit/mL (3 mL) PEN; Commonly known as:   NOVOLOG  FLEXPEN U-100 INSULIN ; Dose: 45 Units; Inject 10 units   subcutaneously with breakfast, 8 units with lunch, and 10 units with   evening meal PLUS sliding scale If blood sugar 151-200: Take extra 2 unit.   If blood sugar 201-250: Take extra 4 units. If blood sugar 251-300: Take   extra 6 units. If blood sugar is 301-350: Take extra 8 units. If blood   sugars > 350: Take extra 10 units. Max daily dose 45 units.  Indications:   type 2 diabetes mellitus; For: type 2 diabetes mellitus; Quantity: 45 mL;   Refills: 1   insulin  degludec 100 unit/mL (3 mL) subcutaneous PEN; Commonly known as:   TRESIBA FLEXTOUCH U-100; Dose: 60 Units; Refills: 0   LANTUS  SOLOSTAR U-100 INSULIN  100 unit/mL (3 mL) subcutaneous PEN;   Generic drug: insulin  glargine; Dose: 50 Units; Doctor's comments: The   pharmacist may select Lantus  Solorstar or Basaglar  Kary based on what   is cheaper for the patient.; Inject fifty Units under the skin at bedtime   daily. Indications: type 2 diabetes mellitus; For: type 2 diabetes   mellitus; Quantity: 60 mL; Refills: 3   MOUNJARO  7.5 mg/0.5 mL injector PEN; Generic drug: tirzepatide ; Dose:   7.5 mg; Inject 0.5 mL under the skin every 7 days. Indications: type 2   diabetes mellitus; For: type 2 diabetes mellitus; Quantity: 6 mL; Refills:   3   ONETOUCH DELICA PLUS LANCET 33 gauge; Generic drug: lancets 33 gauge;   Dose: 1 each; Use one each as directed four times daily. ICD-10: Type II   Diabetes with unspecified complications; with long term insulin  use E11.8,   Z79.4; Quantity: 300 each; Refills: 3   ONETOUCH VERIO TEST STRIPS test strip; Generic drug: blood sugar   diagnostic; Dose: 1 strip; Use one strip as directed before meals and at   bedtime. ICD-10: Type II Diabetes with unspecified complications; with   long term insulin  use E11.8, Z79.4  Indications: type 2 diabetes mellitus;   For: type 2 diabetes mellitus; Quantity: 300 strip; Refills: 3   tacrolimus  1 mg capsule; Commonly known as: PROGRAF ; Dose: 1 mg;   Doctor's comments: Z94.0; Take one capsule by mouth twice daily.   Indications: prevent kidney transplant rejection; For: prevent kidney   transplant rejection; Quantity: 180 capsule; Refills: 3  * This list has 2 medication(s) that are the same as other medications   prescribed for you. Read the directions carefully, and ask your doctor or   other care provider to review them with you.       Return Appointments and Scheduled Appointments     Scheduled appointments:      May 10, 2024 9:30 AM  Telehealth visit with Ulanda JONELLE Melia, APRN-NP  Endocrinology: [facility] (Internal Medicine) [address]  [address], [address]  [place] [address]  [phone]     Jul 11, 2024 2:30 PM  Office visit with Alm DELENA Dec, MD  Urology: [facility] (Urology) [address]  [address], [address]  [place] [address]  [phone]     Jul 23, 2024 3:40 PM  (Arrive by 3:25 PM)  Telehealth visit with Al-Ola DELENA Lace, MD  Oncology: [facility], [facility] [place] [facility] Exam) [address].  [address]  [place] [address]  [phone]     Aug 27, 2024 8:45 AM  Lab Visit with CFT  TRANSPLANT PT LAB  Laboratory: [facility] (--) [address]  [address], [address]  [place]  [place] [address]     Aug 27, 2024 9:40 AM  Office visit with Aletha Bring, MD  Transplant: [facility] (C[facility] [place]) [address]  [address]  [place]  [place] [address]  [phone]            Consults, Procedures, Diagnostics, Micro, Pathology   Consults: Nephrology  Surgical Procedures & Dates: None  Significant Diagnostic Studies, Micro and Procedures: noted in brief hospital course  Significant Pathology: none  Nutrition: Discharge Disposition, Condition   Patient Disposition: Home or Self Care [01]  Condition at Discharge: Stable    Code Status   Full Code    Patient Instructions     Activity       Activity as Tolerated   As directed      It is important to keep increasing your activity level after you leave the hospital.  Moving around can help prevent blood clots, lung infection (pneumonia) and other problems.  Gradually increasing the number of times you are up moving around will help you return to your normal activity level more quickly.  Continue to increase the number of times you are up to the chair and walking daily to return to your normal activity level. Begin to work toward your normal activity level at discharge          Diet       Regular Diet   As directed      You have no dietary restriction. Please continue with a healthy balanced diet.             Discharge education provided to patient.    Additional Orders: Case Management, Supplies, Home Health     Home Health/DME       None            Discharge Attending Time: I, the attending, spent greater than 30 minutes in counseling pt, coordinating discharge care, placing discharge orders and complete discharge summary.    Signed:  Joesph FORBES Rush, MD  03/20/2024      cc:  Primary Care Physician:  Francis Lew   Verified    Referring physicians:  No ref. provider found   Additional provider(s):        Did we miss something? If additional records are needed, please fax a request on office letterhead to [phone]. Please include the patient's name, date of birth, fax number and type of information needed. Additional request can be made by email at [email or web address]. For general questions of information about electronic records sharing, call [phone].

## 2024-03-20 NOTE — Care Plan
 Problem: Discharge Planning  Goal: Participation in plan of care  Outcome: Goal Achieved  Goal: Knowledge regarding plan of care  Outcome: Goal Achieved  Goal: Prepared for discharge  Outcome: Goal Achieved     Problem: Glucose Management  Goal: Absence of hyperglycemia  Outcome: Goal Achieved  Goal: Absence of Hypoglycemia  Outcome: Goal Achieved  Goal: Glucose level within specified parameters  Outcome: Goal Achieved     Problem: Moderate Fall Risk  Goal: Moderate Fall Risk  Outcome: Goal Achieved

## 2024-03-20 NOTE — Progress Notes
 [facility] END OF SHIFT/ JHFRAT NOTE    Admission Date: 03/18/2024  Length of Stay: LOS: 2 days    Acute events, interventions, provider communication: no acute events    Patient Interventions and Education  Fall Risk/JHFRAT Interventions and Education: (Charting when applicable)   Total Fall Risk Score: Total Fall Risk Score: 5.   Elimination Interventions : N/A  Medications : Educate patient on medication side effects  Patient Care Equipment: Ensure environment is free of clutter and walkways are clear from tripping hazards  Mobility: N/A  Cognition: N/A  Risk for Moderate/Major Injury: N/A    Restraints:  No     Restraint Goal: Patient will be free from injury while physically restrained.  See Docflowsheet for restraint documentation, interventions, education, etc.    Hygiene:     Bath/Shower: Refused (pt wants to shower later)           Urinary Catheter / Perineal Care: Self    Intake and Output:           Last Bowel Movement Date:  (pta),           Date 03/19/24 0701 - 03/20/24 0700 03/20/24 0701 - 03/21/24 0700   Shift 0701-1900 1901-0700 24 Hour Total 0701-1900 1901-0700 24 Hour Total   INTAKE   Shift Total(mL/kg)         OUTPUT   Other           Urine Occurrence  1 x  1 x      Shift Total(mL/kg)         NET         Weight (kg) 93.5 93.5 93.5 93.5 93.5 93.5

## 2024-03-21 LAB — IMMUNOFIXATION, SERUM (IFES)

## 2024-03-21 LAB — POC GLUCOSE: ~~LOC~~ BKR POC GLUCOSE: 114 mg/dL — ABNORMAL HIGH (ref 70–100)

## 2024-03-21 NOTE — Progress Notes
 Chaplain Volunteer  Marylu Lund M  visited patient for emotional/spiritual support. If significant need or distress was identified, staff chaplain will follow up as appropriate.    The On-Call Chaplain is available on Voalte or can be paged via the switchboard [phone]) for urgent and emergent needs.   The Spiritual Care team responds to other requests within 24-hours when submitted as a Chaplain Consult in [facility].

## 2024-03-22 ENCOUNTER — Encounter: Admit: 2024-03-22 | Discharge: 2024-03-22 | Payer: PRIVATE HEALTH INSURANCE

## 2024-03-22 LAB — IMMUNOFIXATION URINE RANDOM: ~~LOC~~ BKR TOTAL PROTEIN-UTP: 45 mg/dL

## 2024-03-22 MED ORDER — EVEROLIMUS (IMMUNOSUPPRESSIVE) 0.5 MG PO TAB
.5 mg | ORAL_TABLET | Freq: Every evening | ORAL | 3 refills | 30.00000 days | Status: AC
Start: 2024-03-22 — End: ?
  Filled 2024-04-14: qty 60, 30d supply, fill #0

## 2024-03-22 MED ORDER — EVEROLIMUS (IMMUNOSUPPRESSIVE) 1 MG PO TAB
1 mg | ORAL_TABLET | Freq: Every morning | ORAL | 3 refills | 30.00000 days | Status: AC
Start: 2024-03-22 — End: ?

## 2024-03-25 ENCOUNTER — Encounter: Admit: 2024-03-25 | Discharge: 2024-03-25 | Payer: PRIVATE HEALTH INSURANCE

## 2024-03-26 ENCOUNTER — Encounter: Admit: 2024-03-26 | Discharge: 2024-03-26 | Payer: PRIVATE HEALTH INSURANCE

## 2024-03-27 ENCOUNTER — Encounter: Admit: 2024-03-27 | Discharge: 2024-03-27 | Payer: PRIVATE HEALTH INSURANCE

## 2024-03-28 ENCOUNTER — Encounter: Admit: 2024-03-28 | Discharge: 2024-03-28 | Payer: PRIVATE HEALTH INSURANCE

## 2024-03-28 NOTE — Progress Notes
 Contacted [facility] Shields to refill their medication(s) EVEROLIMUS  (IMMUNOSUPPRESSIVE) 0.5 MG PO TAB, EVEROLIMUS  (IMMUNOSUPPRESSIVE) 1 MG PO TAB.    We were unable to reach the patient after three attempts. At this time, we will no longer attempt to contact the patient for the refill. The patient may be re-enrolled in the pharmacy refill management program at any time by contacting the pharmacy or refilling their medication. Ambulatory pharmacist notified.      [name]  [facility]  [phone]

## 2024-03-29 ENCOUNTER — Ambulatory Visit: Admit: 2024-03-29 | Discharge: 2024-03-29 | Payer: PRIVATE HEALTH INSURANCE

## 2024-03-29 ENCOUNTER — Encounter: Admit: 2024-03-29 | Discharge: 2024-03-29 | Payer: PRIVATE HEALTH INSURANCE

## 2024-04-02 NOTE — Progress Notes
 admitted 03/18/2024 for workup for AKI. History of recent pyelonephritis 8/15. UA with many yeast, per ID, hold off on treatment of yeast in urine at this time, pt asymptomatic. Continue cephalexin  through 8/21. Patient reports feeling dizziness and ear fullness over the last several weeks, has an appt with PCP to address this. Pt orthostatic positive. Holding mounjaro . Persistent urinary incontinence following ureteral stents, following with urology outpatient. Discharged home 03/20/2024 (Gupta/Jeffifer Rabold)     Admit Cr: 5.63   Discharge Cr: 5.21   New Discharge meds: oxybutynin  chloride   Discharge IS: tacrolimus  1mg  BID, everolimus  1mg  AM 0.5mg  PM    03/08/24 DSA  Beads I Negative: No donor specific antibody is present   Beads II Negative: No donor specific antibody is present      Visit/Plan:  Hgb 8.4 on 8/29, aranesp was given in clinic- should repeat this wk?  F/u with urology- needs to be seen, cr up since stent removal. Message Duchene

## 2024-04-03 ENCOUNTER — Ambulatory Visit: Admit: 2024-04-03 | Discharge: 2024-04-04 | Payer: PRIVATE HEALTH INSURANCE

## 2024-04-03 ENCOUNTER — Encounter: Admit: 2024-04-03 | Discharge: 2024-04-03 | Payer: PRIVATE HEALTH INSURANCE

## 2024-04-03 DIAGNOSIS — R7989 Other specified abnormal findings of blood chemistry: Secondary | ICD-10-CM

## 2024-04-03 DIAGNOSIS — Z940 Kidney transplant status: Principal | ICD-10-CM

## 2024-04-03 NOTE — Progress Notes
 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 On PD Jan 2019  DDRT  KPDI 92%, cPRA 0%  Physical Crossmatch B cell negative T cell negative  CMV D -/ R -  Induction: Thymo  Maintenance IS: Dual  IS   PD not removed with surgery   Post-op Complications:  - None    Referring Nephrologist:  Marinda Eaton  [address]  [place]   [place] [address]  Phone: [phone]  Fax: [phone]     Dear Dr. Marinda Eaton,    Jacqueline Shields is a 58 year old female who presents for follow-up regarding elevated creatinine levels.    She has no current urinary tract infections and completed a course of antibiotics the day after hospital discharge. She has not undergone a biopsy since her hospital discharge. No diarrhea or nausea.    A stent was removed in April, and an ultrasound in August showed persistent swelling in the kidney and ureter. She is scheduled to see urology in December, but there is concern about the delay due to ongoing issues.    She is currently taking several medications, including oxybutynin  5 mg twice daily as needed, prasuvastatin 10 mg daily, tacrolimus  1 mg twice daily, and Mounjaro  once a week. She also mentions taking everolimus  and tacrolimus .    No abnormal symptoms related to eating, energy levels, or other concerns.    REVIEW OF SYSTEMS: Comprehensive 14-point ROS reviewed  Positives noted in HPI otherwise negative.    Past History:  Past Medical History:    Abnormal biopsy of kidney    Anemia    Chronic kidney disease    Coronary artery disease due to lipid rich plaque    DM (diabetes mellitus), type 2 (CMS-HCC)    ESRD (end stage renal disease) (CMS-HCC)    Gastroparesis    GERD (gastroesophageal reflux disease)    H/O kidney transplant    HLD (hyperlipidemia)    Hypertension    Kidney failure    Kidney stones    MGUS (monoclonal gammopathy of unknown significance)    Obesity    Proteinuria    PUD (peptic ulcer disease)    PVD (peripheral vascular disease)    Urinary tract infection       Surgical History:   Procedure Laterality Date    ANGIOGRAPHY CORONARY ARTERY WITH LEFT HEART CATHETERIZATION N/A 07/03/2020    Performed by Val Schiavo, Kamal, MD at [facility] CATH LAB    POSSIBLE PERCUTANEOUS CORONARY STENT PLACEMENT WITH ANGIOPLASTY N/A 07/03/2020    Performed by Charlanne Francois, MD at [facility] CATH LAB    ALLOTRANSPLANTATION KIDNEY FROM NON LIVING DONOR WITHOUT RECIPIENT NEPHRECTOMY N/A 07/15/2020    Performed by Terrie Sieving, MD at [facility] OR    REMOVAL TUNNELED INTRAPERITONEAL CATHETER Bilateral 08/24/2020    Performed by Terrie Sieving, MD at [facility] OR    CYSTOURETHROSCOPY WITH REMOVAL FOREIGN BODY/ CALCULUS/ STENT FROM URETHRA/ BLADDER - SIMPLE Right 08/24/2020    Performed by Carolynn Ruther ORN, MD at [facility] OR    ESOPHAGOGASTRODUODENOSCOPY WITH BIOPSY - FLEXIBLE N/A 04/17/2023    Performed by Chipper Neas, MD at [facility] ENDO    COLONOSCOPY WITH BIOPSY - FLEXIBLE N/A 04/17/2023    Performed by Chipper Neas,  MD at [facility] ENDO    ESOPHAGOGASTRODUODENOSCOPY WITH SPECIMEN COLLECTION BY BRUSHING/ WASHING N/A 07/21/2023    Performed by Arlan Nancyann PARAS, MD at [facility] ENDO    ANTEGRADE UROGRAPHY Right 09/22/2023    Performed by Erskin Alm LABOR, MD at [facility] OR    RETROGRADE UROGRAPHY WITH/ WITHOUT KUB Right 09/22/2023    Performed by Erskin Alm LABOR, MD at [facility] OR    REMOVAL, NEPHROSTOMY TUBE, WITH FLUOROSCOPIC GUIDANCE Right 09/22/2023    Performed by Erskin Alm LABOR, MD at [facility] OR    CYSTOURETHROSCOPY WITH URETEROSCOPY WITH TREATMENT URETERAL STRICTURE Right 09/22/2023    Performed by Erskin Alm LABOR, MD at [facility] OR    CYSTOURETHROSCOPY WITH URETEROSCOPY AND/ OR PYELOSCOPY - DIAGNOSTIC Right 11/17/2023    Performed by Erskin Alm LABOR, MD at [facility] OR    RETROGRADE UROGRAPHY WITH/ WITHOUT KUB Right 11/17/2023    Performed by Erskin Alm LABOR, MD at [facility] OR    CYSTOURETHROSCOPY WITH REMOVAL FOREIGN BODY/ CALCULUS/ STENT FROM URETHRA/ BLADDER - SIMPLE Right 11/17/2023    Performed by Erskin Alm LABOR, MD at [facility] OR    CATHETER IMPLANT/REVISION      PD cath    CESAREAN SECTION  1991    HX CESAREAN SECTION  1991    HX HYSTERECTOMY  2000    still has ovaries    HX LITHOTRIPSY      HX TUBAL LIGATION  1995       Social History     Socioeconomic History    Marital status: Married     Spouse name: Roger    Number of children: 2   Tobacco Use    Smoking status: Never    Smokeless tobacco: Never   Vaping Use    Vaping status: Never Used   Substance and Sexual Activity    Alcohol use: Not Currently    Drug use: Never    Sexual activity: Not Currently     Partners: Male     Birth control/protection: None   Social History Narrative    [place] Works at [employer], puts labels on bottles.       Family History   Problem Relation Name Age of Onset    Diabetes Mother Mom     Hypertension Mother Mom     Diabetes Father Lukemia     Hypertension Father Lukemia     Cancer-Hematologic Father Lukemia     Cancer Father Lukemia     Diabetes Sister Macario     Migraines Sister Macario     Cancer Sister Macario         Endometrial       No Known Allergies    Current Medications:    Current Outpatient Medications:     aspirin  EC (ASPIR-LOW) 81 mg tablet, Take one tablet by mouth daily. Take with food., Disp: 90 tablet, Rfl: 3    blood sugar diagnostic (ONETOUCH VERIO TEST STRIPS) test strip, Use one strip as directed before meals and at bedtime. ICD-10: Type II Diabetes with unspecified complications; with long term insulin  use E11.8, Z79.4  Indications: type 2 diabetes mellitus, Disp: 300 strip, Rfl: 3    CALCIUM  PO, Take 1 tablet by mouth daily., Disp: , Rfl:     CHOLEcalciferoL  (vitamin D3) 50 mcg (2,000 unit) capsule, TAKE 1 CAPSULE BY MOUTH EVERY DAY, Disp: 90 capsule, Rfl: 3    DEXCOM G7 SENSOR sensor device, Use one each as directed every 10 days. Indications:  type 2 diabetes mellitus, Disp: 9 each, Rfl: 3    everolimus  (immunosuppressive) (ZORTRESS ) 0.5 mg tablet, Take one tablet by mouth every evening. Take along with 1 mg tablet for a total everolimus  dose of 1 mg every morning and 0.5 mg every evening.  Indications: prevent kidney transplant rejection, Disp: 90 tablet, Rfl: 3    everolimus  (immunosuppressive) (ZORTRESS ) 1 mg tablet, Take one tablet by mouth every morning. Take along with 0.5 mg tablet for a total everolimus  dose of 1 mg every morning and 0.5 mg every evening.  Indications: prevent kidney transplant rejection, Z94.0, Disp: 90 tablet, Rfl: 3    ferrous sulfate  (FEOSOL) 325 mg (65 mg iron ) tablet, Take one tablet by mouth at bedtime daily. Take on an empty stomach at least 1 hour before or 2 hours after food. (Patient taking differently: Take one tablet by mouth daily before breakfast. Take on an empty stomach at least 1 hour before or 2 hours after food.), Disp: 30 tablet, Rfl: 3    glucose (DEX4 GLUCOSE) 4 gram chewable tablet, Chew four tablets by mouth as Needed. Indications: low blood sugar, Disp: 50 tablet, Rfl: 0    insulin  aspart (U-100) (NOVOLOG  FLEXPEN U-100 INSULIN ) 100 unit/mL (3 mL) PEN, Inject 10 units subcutaneously with breakfast, 8 units with lunch, and 10 units with evening meal PLUS sliding scale If blood sugar 151-200: Take extra 2 unit. If blood sugar 201-250: Take extra 4 units. If blood sugar 251-300: Take extra 6 units. If blood sugar is 301-350: Take extra 8 units. If blood sugars > 350: Take extra 10 units. Max daily dose 45 units.  Indications: type 2 diabetes mellitus, Disp: 45 mL, Rfl: 1    insulin  degludec (TRESIBA FLEXTOUCH U-100) 100 unit/mL (3 mL) subcutaneous PEN, Inject sixty Units under the skin at bedtime daily., Disp: , Rfl:     insulin  glargine (LANTUS  SOLOSTAR U-100 INSULIN ) 100 unit/mL (3 mL) subcutaneous PEN, Inject fifty Units under the skin at bedtime daily. Indications: type 2 diabetes mellitus (Patient not taking: Reported on 04/03/2024), Disp: 60 mL, Rfl: 3    lancets 33 gauge (ONETOUCH DELICA 33 GUAGE) 33 gauge, Use one each as directed four times daily. ICD-10: Type II Diabetes with unspecified complications; with long term insulin  use E11.8, Z79.4, Disp: 300 each, Rfl: 3    oxyBUTYnin  chloride (DITROPAN ) 5 mg tablet, Take one tablet by mouth twice daily as needed. Indications: a condition where the urge to urinate results in urine leakage, Disp: 60 tablet, Rfl: 0    pen needle, diabetic (BD ULTRA-FINE NANO PEN NEEDLE) 32 gauge x 5/32 pen needle, Use one each as directed as Needed. Use with insulin  injections., Disp: 300 each, Rfl: 3    rosuvastatin  (CRESTOR ) 10 mg tablet, Take one tablet by mouth daily. Indications: excessive fat in the blood, Disp: 60 tablet, Rfl: 0    tacrolimus  (PROGRAF ) 1 mg capsule, Take one capsule by mouth twice daily. Indications: prevent kidney transplant rejection, Disp: 180 capsule, Rfl: 3    tirzepatide  (MOUNJARO ) 7.5 mg/0.5 mL injector PEN, Inject 0.5 mL under the skin every 7 days. Indications: type 2 diabetes mellitus, Disp: 6 mL, Rfl: 3    Current Facility-Administered Medications:     darbepoetin alfa (ARANESP) (*) injection 100 mcg, 100 mcg, Subcutaneous, Q7 Days, Lindsey Demonte, MD, 100 mcg at 03/29/24 [id number]    Physical Exam:  There were no vitals filed for this visit.  There is no height or weight on file to calculate BMI.  General: NAD, A+Ox4, calm and pleasant. Appears to be stated age.   TH  Laboratory studies:     CMP:      Latest Ref Rng & Units 03/29/2024     9:24 AM 03/20/2024     4:21 AM 03/19/2024     4:27 AM 03/18/2024     9:17 PM 03/15/2024     1:56 PM   CMP   Sodium 137 - 147 mmol/L 144  142  141  139  142    Potassium 3.5 - 5.1 mmol/L 3.7  3.7  3.1  3.3  3.5    Chloride 98 - 110 mmol/L 110  111  110  105  109    CO2 21 - 30 mmol/L 23  19  21  21  22     Anion Gap 3 - 12 11  12  10  13  11     Blood Urea Nitrogen 7 - 25 mg/dL 41  49  46 46  47    Creatinine 0.40 - 1.00 mg/dL 4.01  4.78  4.58  4.36  6.23    Glucose 70 - 100 mg/dL 82  859  861  897  856    Calcium  8.5 - 10.6 mg/dL 8.5  7.8  8.0  8.6  8.6    Total Protein 6.0 - 8.0 g/dL 7.3  6.2  6.1  7.7     Albumin 3.5 - 5.0 g/dL 3.6  3.2  3.2  4.0     Alk Phosphatase 25 - 110 U/L 65  54  57  66     ALT (SGPT) 7 - 56 U/L 20  15  10  14      AST 7 - 40 U/L 20  23  15  17      Total Bilirubin 0.2 - 1.3 mg/dL 0.4  0.3  0.3  0.4     GFR >60 mL/min 8  9  9  8  7       Hemoglobin A1C (%)   Date Value   01/19/2024 7.2 (H)   04/13/2023 11.1 (H)   10/30/2021 11.6 (H)   05/29/2021 10.8 (H)     PTH Hormone   Date Value   01/19/2024 176.2 pg/mL (H)   10/03/2023 116.6 pg/mL (H)   04/25/2023 151.5 PG/ML (H)   10/30/2021 195.9 PG/ML (H)     No results found for: LIPASE  No results found for: AMY  BK Virus Plasma Quant (no units)   Date Value   04/14/2023 BK Virus Not Detected   10/30/2021     NOT DETECTED  Reference range: NOT DETECTED  Unit: IU/mL  .  Assay Range: 33 IU/mL to 3.30E+08 IU/mL  .  One IU is equal to 0.33 copies of BKV.  .  The limit of quantitation (LOQ) is 33 IU/mL. BK virus DNA detected  below the LOQ  will be reported as Detected:<33 IU/mL.  .  This test was developed and its performance characteristics  determined by  [facility]. It has not been cleared or approved by the U.S.  Food and Drug  Administration. Results should be used in conjunction with clinical  findings,  and should not form the sole basis for a diagnosis or treatment  decision.  ____________________________________________________________  Testing Performed At:  [facility]  [address]  [place], [place] [address]  Laboratory Director: Lucious Betters Ph.D., BCLD (ABB)  CLIA#: [id number]  Phone: [phone]     10/02/2021  NOT DETECTED  Reference range: NOT DETECTED  Unit: IU/mL  TESTING PERFORMED AT LOW VOLUME ON PLASMA SPECIMEN FOR BKV qPCR, MAY  AFFECT  RESULTS.  SABRA  Assay Range: 33 IU/mL to 3.30E+08 IU/mL  .  One IU is equal to 0.33 copies of BKV.  .  The limit of quantitation (LOQ) is 33 IU/mL. BK virus DNA detected  below the LOQ  will be reported as Detected:<33 IU/mL.  .  This test was developed and its performance characteristics  determined by  [facility]. It has not been cleared or approved by the U.S.  Food and Drug  Administration. Results should be used in conjunction with clinical  findings,  and should not form the sole basis for a diagnosis or treatment  decision.  ____________________________________________________________  Testing Performed At:  [facility]  [address]  [place], [place] [address]  Laboratory Director: Lucious Betters Ph.D., BCLD (ABB)  CLIA#: [id number]  Phone: [phone]     09/04/2021     NOT DETECTED  Reference range: NOT DETECTED  Unit: IU/mL  .  Assay Range: 33 IU/mL to 3.30E+08 IU/mL  .  One IU is equal to 0.33 copies of BKV.  .  The limit of quantitation (LOQ) is 33 IU/mL. BK virus DNA detected  below the LOQ  will be reported as Detected:<33 IU/mL.  .  This test was developed and its performance characteristics  determined by  [facility]. It has not been cleared or approved by the U.S.  Food and Drug  Administration. Results should be used in conjunction with clinical  findings,  and should not form the sole basis for a diagnosis or treatment  decision.  ____________________________________________________________  Testing Performed At:  [facility]  [address]  [place], [place] [address]  Laboratory Director: Lucious Betters Ph.D., BCLD (ABB)  CLIA#: [id number]  Phone: [phone]     08/03/2021     NOT DETECTED  Reference range: NOT DETECTED  Unit: IU/mL  .  Assay Range: 33 IU/mL to 3.30E+08 IU/mL  .  One IU is equal to 0.33 copies of BKV.  .  The limit of quantitation (LOQ) is 33 IU/mL. BK virus DNA detected  below the LOQ  will be reported as Detected:<33 IU/mL.  .  This test was developed and its performance characteristics  determined by  [facility]. It has not been cleared or approved by the U.S.  Food and Drug  Administration. Results should be used in conjunction with clinical  findings,  and should not form the sole basis for a diagnosis or treatment  decision.  ____________________________________________________________  Testing Performed At:  [facility]  [address]  [place], [place] [address]  Laboratory Director: Lucious Betters Ph.D., BCLD (ABB)  CLIA#: [id number]  Phone: [phone]       BK Virus Plasma (no units)   Date Value   03/29/2024 Not Detected   03/08/2024 Not Detected   01/19/2024 Not Detected   12/22/2023 Not Detected   12/09/2023 Not Detected     CMV DNA Quant PCR ([IU]/mL)   Date Value   04/14/2023 CMV DNA NOT DETECTED   12/04/2021 CMV DNA NOT DETECTED   10/30/2021 CMV DNA NOT DETECTED   10/02/2021 CMV DNA NOT DETECTED   09/04/2021 CMV DNA NOT DETECTED     IU/mL CMV Blood (no units)   Date Value   12/04/2021     <50 IU/mL  The test method detects and quantitates CMV DNA using the  [employer] RealTime assay,   and is approved by the FDA for monitoring hematopoietic stem cell transplant   patients who are undergoing anti-CMV therapy.  Please correlate results with the   clinical status of the patient.     10/30/2021     <50 IU/mL  The test method detects and quantitates CMV DNA using the [employer] RealTime assay,   and is approved by the FDA for monitoring hematopoietic stem cell transplant   patients who are undergoing anti-CMV therapy.  Please correlate results with the   clinical status of the patient.     10/02/2021     <50 IU/mL  The test method detects and quantitates CMV DNA using the Abbott RealTime assay,   and is approved by the FDA for monitoring hematopoietic stem cell transplant   patients who are undergoing anti-CMV therapy.  Please correlate results with the   clinical status of the patient.     09/04/2021     <50 IU/mL  The test method detects and quantitates CMV DNA using the Abbott RealTime assay,   and is approved by the FDA for monitoring hematopoietic stem cell transplant   patients who are undergoing anti-CMV therapy.  Please correlate results with the   clinical status of the patient.     08/03/2021     <50 IU/mL  The test method detects and quantitates CMV DNA using the Abbott RealTime assay,   and is approved by the FDA for monitoring hematopoietic stem cell transplant   patients who are undergoing anti-CMV therapy.  Please correlate results with the   clinical status of the patient.       No results found for: COPIES  EBV DNA, Quant. (no units)   Date Value   04/14/2023 EBV DNA Not Detected       TACROLIMUS  LEVEL:  Tacrolimus  Immunoassay (ng/mL)   Date Value   03/29/2024 5.1   03/20/2024 4.7 (L)   03/19/2024 4.9 (L)   03/08/2024 5.4   01/19/2024 5.3   12/22/2023 5.8   12/09/2023 6.6   12/02/2023 5.1   04/25/2023 7.3   04/21/2023 5.1   04/20/2023 6.1   04/19/2023 5.7   04/18/2023 4.9 (L)   04/17/2023 5.1   04/16/2023 10.0   04/15/2023 8.7       CBC with Diff:      Latest Ref Rng & Units 03/29/2024     9:24 AM 03/20/2024     4:21 AM   CBC with Diff   WBC 4.50 - 11.00 10*3/uL 6.50  5.30    RBC 4.00 - 5.00 10*6/uL 3.14  3.08    Hemoglobin 12.0 - 15.0 g/dL 8.4  8.2    Hematocrit 36.0 - 45.0 % 25.4  24.2    MCV 80.0 - 100.0 fL 80.9  78.5    MCH 26.0 - 34.0 pg 26.7  26.6    MCHC 32.0 - 36.0 g/dL 66.9  66.0    RDW 88.9 - 15.0 % 16.9  16.8    Platelet Count 150 - 400 10*3/uL 163  194    MPV 7.0 - 11.0 fL 9.0  8.3    Neurtrophils 41.0 - 77.0 % 76.9  73.6    Absolute Neutrophils 1.80 - 7.00 10*3/uL 5.00  3.90    Absolute Lymph Count 1.00 - 4.80 10*3/uL 0.80  0.80    Absolute Monocyte Count 0.00 - 0.80 10*3/uL 0.50  0.40    Eosinophils 0.0 - 5.0 % 1.8  1.7  Absolute Eosinophil Count 0.00 - 0.45 10*3/uL 0.10  0.10    Basophils 0.0 - 2.0 % 0.6  0.6      Lab Results   Component Value Date/Time    IRON  20 (L) 03/08/2024 10:33 AM    TIBC 201 (L) 03/08/2024 10:33 AM    PSAT 10 (L) 03/08/2024 10:33 AM    FERRITIN 398 (H) 03/08/2024 10:33 AM    FERRITIN 327 (H) 01/19/2024 12:17 PM       Urinalysis:  Lab Results   Component Value Date/Time    UCOLOR Yellow 03/29/2024 09:40 AM    TURBID 2+ (A) 03/29/2024 09:40 AM    USPGR 1.006 03/29/2024 09:40 AM    UPH 6.0 03/29/2024 09:40 AM    UPROTEIN 1+ (A) 03/29/2024 09:40 AM    UAGLU 1+ (A) 03/29/2024 09:40 AM    UKET Negative 03/29/2024 09:40 AM    UBILE Negative 03/29/2024 09:40 AM    UBLD 2+ (A) 03/29/2024 09:40 AM    UROB Normal 03/29/2024 09:40 AM     Protein/CR ratio (no units)   Date Value   03/29/2024 0.72 (H)   03/08/2024 1.12 (H)   01/19/2024 0.76 (H)   12/22/2023 0.81 (H)   12/09/2023 0.72 (H)   04/13/2023 1.7 (H)   01/08/2022 0.7 (H)   12/04/2021 0.7 (H)   10/30/2021 0.7 (H)   10/02/2021 0.5 (H)       Imaging:  Results for orders placed during the hospital encounter of 04/13/23    CT ABD/PELV WO CONTRAST    Impression  1. Right lower quadrant renal transplant. Persistent hydronephrosis. Perinephric and periureteral stranding is nonspecific and may be from infection or inflammation.    2. Decompressed urinary bladder about a Foley catheter. Small gas in the bladder lumen, likely from the presence of the catheter. Diffuse bladder wall thickening and perivesicular stranding, nonspecific but may be from cystitis.    3. Small to upper limits normal size retroperitoneal and pelvic lymph nodes, indeterminate but may be reactive. Early lymphoproliferative process such as post transplant lymphoproliferative disorder could potentially have this appearance. Short interval follow-up CT abdomen pelvis in 3 months recommended to evaluate for stability.    4. Mild native renal atrophy. Subcentimeter nonobstructing right renal calculi.      Finalized by SHAUN BEST, M.D. on 04/18/2023 3:18 PM. Dictated by EVELEEN PATERSON, M.D. on 04/18/2023 3:09 PM.    Results for orders placed during the hospital encounter of 04/13/23    CHEST 2 VIEWS    Impression  No acute cardiopulmonary abnormality.      Finalized by Duwaine Eagles, M.D. on 04/14/2023 6:57 AM. Dictated by Duwaine Eagles, M.D. on 04/14/2023 6:56 AM.    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 volume  depletion, hydronephrosis/hydroureter.  PCNU removed 09/22/2023, dilation of transplant ureteral stricture and placement of ureteral stent - Requiring frequent exchanges.     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 No signs of rejection or infection. Swelling in the kidney and ureter persists, likely due to obstruction. Previous stent removal in April may have contributed to the current condition. Differential diagnosis includes structural obstruction as the primary cause of elevated creatinine. Discussed the potential need for another kidney transplant due to uncertain reversibility of current kidney damage.  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 No new symptoms reported.  - Administer Arenesp  - Repeat labs on Friday to monitor hemoglobin levels    # Kidney stones  Nonobstructing nephrolithiasis documented on recent imaging. Potential contribution to kidney infection and pain.  - Review CT scan from recent hospitalization to assess for kidney stones.  - If stones are present, test urine for stone formation and consider dietary or medical interventions to prevent recurrence.    # Diabetes mellitus  Improved blood sugar control with better hemoglobin A1c. Weight loss likely due to Mounjaro , reducing appetite.      # Immunosuppression:  - On tac and everolimus  , Level ~5 for both     - Remains off MPA, 04/17/2023 colon biopsy shows colitis rare: Cells positive for CMV on colonic biopsy  - off prednisone  for now,      #. HTN:   Not on antihypertensive medications     # ID:  - Rare staining positive for CMV in colon biopsy,   - No longer on valcyte .    # MGUS, IgG kappa  Ongoing follow-up with hematology  FLC ratio 1.88  Serum M 0.62    # CAD s/p cath 07/03/20: superior branch LAD 80% disease, RCA 40-50% mid RCA stenosis at/around origin RV marginal branch; RV marginal branch also has ~80% stenosis; guide directed medical therapy, risk factor optimization  -On aspirin , statin, not on beta-blocker      #) Health maintenance by PCP  Pap smear: no longer does  Mammogram: April 2024  Colonoscopy: UTD, 04/17/23 (recc repeat 09/2023, Upper endo completed but not lower)  Dermatology: Per PCP, needs annual skin cancer surveillance due to long-term immunosuppression     RTC 4 months  Labs monthly      Aletha Bring, MD     Cc: Marinda Eaton  Cc: Adrien Eck    Please contact the [facility] at [phone] for any transplant related questions or concerns that may arise.

## 2024-04-04 ENCOUNTER — Encounter: Admit: 2024-04-04 | Discharge: 2024-04-04 | Payer: PRIVATE HEALTH INSURANCE

## 2024-04-05 ENCOUNTER — Encounter: Admit: 2024-04-05 | Discharge: 2024-04-05 | Payer: PRIVATE HEALTH INSURANCE

## 2024-04-05 ENCOUNTER — Ambulatory Visit: Admit: 2024-04-05 | Discharge: 2024-04-05 | Payer: PRIVATE HEALTH INSURANCE

## 2024-04-05 DIAGNOSIS — Z940 Kidney transplant status: Principal | ICD-10-CM

## 2024-04-05 MED ADMIN — DARBEPOETIN ALFA IN POLYSORBAT 100 MCG/0.5 ML IJ SYRG [129704]: 100 ug | SUBCUTANEOUS | @ 15:00:00

## 2024-04-08 ENCOUNTER — Encounter: Admit: 2024-04-08 | Discharge: 2024-04-08 | Payer: PRIVATE HEALTH INSURANCE

## 2024-04-11 ENCOUNTER — Encounter: Admit: 2024-04-11 | Discharge: 2024-04-11 | Payer: PRIVATE HEALTH INSURANCE

## 2024-04-12 ENCOUNTER — Encounter: Admit: 2024-04-12 | Discharge: 2024-04-12 | Payer: PRIVATE HEALTH INSURANCE

## 2024-04-12 ENCOUNTER — Ambulatory Visit: Admit: 2024-04-12 | Discharge: 2024-04-12 | Payer: PRIVATE HEALTH INSURANCE

## 2024-04-12 ENCOUNTER — Ambulatory Visit: Admit: 2024-04-12 | Discharge: 2024-04-13 | Payer: PRIVATE HEALTH INSURANCE

## 2024-04-12 MED ORDER — PEN NEEDLE, DIABETIC 32 GAUGE X 5/32" MISC NDLE
1 | 3 refills | 90.00000 days | Status: AC | PRN
Start: 2024-04-12 — End: ?

## 2024-04-13 ENCOUNTER — Encounter: Admit: 2024-04-13 | Discharge: 2024-04-13 | Payer: PRIVATE HEALTH INSURANCE

## 2024-04-14 ENCOUNTER — Encounter: Admit: 2024-04-14 | Discharge: 2024-04-14 | Payer: PRIVATE HEALTH INSURANCE

## 2024-04-15 ENCOUNTER — Encounter: Admit: 2024-04-15 | Discharge: 2024-04-15 | Payer: PRIVATE HEALTH INSURANCE

## 2024-04-17 ENCOUNTER — Encounter: Admit: 2024-04-17 | Discharge: 2024-04-17 | Payer: PRIVATE HEALTH INSURANCE

## 2024-04-18 ENCOUNTER — Encounter: Admit: 2024-04-18 | Discharge: 2024-04-18 | Payer: PRIVATE HEALTH INSURANCE

## 2024-04-18 DIAGNOSIS — D849 Immunodeficiency, unspecified: Secondary | ICD-10-CM

## 2024-04-18 DIAGNOSIS — Z940 Kidney transplant status: Principal | ICD-10-CM

## 2024-04-18 DIAGNOSIS — Z79899 Other long term (current) drug therapy: Secondary | ICD-10-CM

## 2024-04-18 MED FILL — PEN NEEDLE, DIABETIC 32 GAUGE X 5/32" MISC NDLE: 32 gauge x 5/" | 75 days supply | Qty: 300 | Fill #0 | Status: AC

## 2024-04-19 ENCOUNTER — Ambulatory Visit: Admit: 2024-04-19 | Discharge: 2024-04-19 | Payer: PRIVATE HEALTH INSURANCE

## 2024-04-19 ENCOUNTER — Encounter: Admit: 2024-04-19 | Discharge: 2024-04-19 | Payer: PRIVATE HEALTH INSURANCE

## 2024-04-21 ENCOUNTER — Inpatient Hospital Stay: Admit: 2024-04-21 | Discharge: 2024-04-21 | Payer: PRIVATE HEALTH INSURANCE

## 2024-04-21 ENCOUNTER — Encounter: Admit: 2024-04-21 | Discharge: 2024-04-21 | Payer: PRIVATE HEALTH INSURANCE

## 2024-04-21 DIAGNOSIS — N179 Acute kidney failure, unspecified: Secondary | ICD-10-CM

## 2024-04-21 MED ORDER — ONDANSETRON 4 MG PO TBDI
4 mg | ORAL | 0 refills | Status: DC | PRN
Start: 2024-04-21 — End: 2024-04-30

## 2024-04-21 MED ORDER — EVEROLIMUS (IMMUNOSUPPRESSIVE) 0.5 MG PO TAB
2 mg | Freq: Two times a day (BID) | ORAL | 0 refills | Status: DC
Start: 2024-04-21 — End: 2024-04-22

## 2024-04-21 MED ORDER — TACROLIMUS 1 MG PO CAP
1 mg | Freq: Two times a day (BID) | ORAL | 0 refills | Status: DC
Start: 2024-04-21 — End: 2024-04-24
  Administered 2024-04-22 – 2024-04-24 (×6): 1 mg via ORAL

## 2024-04-21 MED ORDER — INSULIN GLARGINE 100 UNIT/ML (3 ML) SC INJ PEN
50 [IU] | Freq: Every evening | SUBCUTANEOUS | 0 refills | Status: DC
Start: 2024-04-21 — End: 2024-04-22

## 2024-04-21 MED ORDER — FLU VACC TS2025-26(65YR UP)-PF 180 MCG/0.5 ML IM SYRG
.5 mL | Freq: Once | INTRAMUSCULAR | 0 refills | Status: CP
Start: 2024-04-21 — End: ?
  Administered 2024-04-30: 17:00:00 0.5 mL via INTRAMUSCULAR

## 2024-04-21 MED ORDER — POLYETHYLENE GLYCOL 3350 17 GRAM PO PWPK
1 | Freq: Every day | ORAL | 0 refills | Status: DC | PRN
Start: 2024-04-21 — End: 2024-04-30

## 2024-04-21 MED ORDER — INSULIN ASPART 100 UNIT/ML SC FLEXPEN
10 [IU] | Freq: Three times a day (TID) | SUBCUTANEOUS | 0 refills | Status: DC
Start: 2024-04-21 — End: 2024-04-22

## 2024-04-21 MED ORDER — OXYBUTYNIN CHLORIDE 5 MG PO TAB
5 mg | Freq: Two times a day (BID) | ORAL | 0 refills | Status: DC | PRN
Start: 2024-04-21 — End: 2024-04-30
  Administered 2024-04-28: 16:00:00 5 mg via ORAL

## 2024-04-21 MED ORDER — SENNOSIDES-DOCUSATE SODIUM 8.6-50 MG PO TAB
1 | Freq: Every day | ORAL | 0 refills | Status: DC | PRN
Start: 2024-04-21 — End: 2024-04-30
  Administered 2024-04-22 – 2024-04-24 (×2): 1 via ORAL

## 2024-04-21 MED ORDER — INSULIN ASPART 100 UNIT/ML SC FLEXPEN
10 [IU] | Freq: Every day | SUBCUTANEOUS | 0 refills | Status: DC
Start: 2024-04-21 — End: 2024-04-24

## 2024-04-21 MED ORDER — CEFTRIAXONE INJ 1GM IVP
2 g | INTRAVENOUS | 0 refills | Status: CP
Start: 2024-04-21 — End: ?
  Administered 2024-04-22 – 2024-04-27 (×6): 2 g via INTRAVENOUS

## 2024-04-21 MED ORDER — INSULIN ASPART 100 UNIT/ML SC FLEXPEN
0-6 [IU] | Freq: Before meals | SUBCUTANEOUS | 0 refills | Status: DC
Start: 2024-04-21 — End: 2024-04-30
  Administered 2024-04-24: 03:00:00 1 [IU] via SUBCUTANEOUS

## 2024-04-21 MED ORDER — EVEROLIMUS (IMMUNOSUPPRESSIVE) 0.5 MG PO TAB
1 mg | Freq: Two times a day (BID) | ORAL | 0 refills | Status: DC
Start: 2024-04-21 — End: 2024-04-25
  Administered 2024-04-22 – 2024-04-24 (×7): 1 mg via ORAL

## 2024-04-21 MED ORDER — HEPARIN, PORCINE (PF) 5,000 UNIT/0.5 ML IJ SYRG
5000 [IU] | SUBCUTANEOUS | 0 refills | Status: DC
Start: 2024-04-21 — End: 2024-04-30
  Administered 2024-04-22 – 2024-04-30 (×15): 5000 [IU] via SUBCUTANEOUS

## 2024-04-21 MED ORDER — INSULIN ASPART 100 UNIT/ML SC FLEXPEN
8 [IU] | Freq: Every day | SUBCUTANEOUS | 0 refills | Status: DC
Start: 2024-04-21 — End: 2024-04-24

## 2024-04-21 MED ORDER — LACTATED RINGERS IV SOLP
INTRAVENOUS | 0 refills | Status: AC
Start: 2024-04-21 — End: ?
  Administered 2024-04-22 (×2): 1000.0000 mL via INTRAVENOUS

## 2024-04-21 MED ORDER — ACETAMINOPHEN 325 MG PO TAB
650 mg | ORAL | 0 refills | Status: DC | PRN
Start: 2024-04-21 — End: 2024-04-30
  Administered 2024-04-22 – 2024-04-26 (×2): 650 mg via ORAL

## 2024-04-21 MED ORDER — ASPIRIN 81 MG PO TBEC
81 mg | Freq: Every day | ORAL | 0 refills | Status: DC
Start: 2024-04-21 — End: 2024-04-24
  Administered 2024-04-22: 14:00:00 81 mg via ORAL

## 2024-04-21 MED ORDER — ROSUVASTATIN 10 MG PO TAB
10 mg | Freq: Every day | ORAL | 0 refills | Status: DC
Start: 2024-04-21 — End: 2024-04-30
  Administered 2024-04-22 – 2024-04-30 (×9): 10 mg via ORAL

## 2024-04-21 MED ORDER — MELATONIN 5 MG PO TAB
5 mg | Freq: Every evening | ORAL | 0 refills | Status: DC | PRN
Start: 2024-04-21 — End: 2024-04-30

## 2024-04-21 MED ORDER — DEXTROSE 50 % IN WATER (D50W) IV SYRG
12.5-25 g | INTRAVENOUS | 0 refills | Status: DC | PRN
Start: 2024-04-21 — End: 2024-04-30

## 2024-04-21 MED ORDER — DARBEPOETIN ALFA IN POLYSORBAT 100 MCG/0.5 ML IJ SYRG
100 ug | SUBCUTANEOUS | 0 refills | Status: DC
Start: 2024-04-21 — End: 2024-04-22

## 2024-04-21 MED ORDER — ONDANSETRON HCL (PF) 4 MG/2 ML IJ SOLN
4 mg | INTRAVENOUS | 0 refills | Status: DC | PRN
Start: 2024-04-21 — End: 2024-04-30
  Administered 2024-04-22 – 2024-04-27 (×6): 4 mg via INTRAVENOUS

## 2024-04-21 NOTE — Progress Notes
 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

## 2024-04-21 NOTE — H&P (View-Only)
 Internal Medicine History and Physical      Patient's Name:  Jacqueline Shields MRN: [id number]   Today's Date:  04/21/2024  Admission Date: 04/21/2024  LOS: 0 days    Problem list  Active Problems:    Urinary tract infection    High anion gap metabolic acidosis      History of Present Illness:       Jacqueline Shields is a 58 y.o. 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        She presented with 3 days of continuous nausea and vomiting, stating that she cannot count the amount of times she has vomited per day.  She states she has not been able to keep down solid or liquid oral intake in 3 days. She states that she has intermittent slight urinary incontinence but does not make meaningful urine. She denies increased spasm frequency, dysuria, or foul odor. She denies abdominal pain or flank pain. She has had no fevers, chills, chest pain, shortness of breath, cough, congestion, or diarrhea. Her last bowel movement was this afternoon and was formed. She denies recent sick contacts. She states that at the OSH, a foley catheter was placed and drained turbid, milky fluid.        Pertinent OSH workup:  CBC: WBC 3.74 Hb 9.6 Plt 209  CMP: Na 139 K 3.3 Cl 101 CO2 15 Gap 23 AST 42 ALT 9 ALP 68   Lactic acid 2.4   Procal 0.61  CT abdomen/ pelvis: 1.8 cm cavitary lesion in right middle lobe with slightly improved aeration. Interval development of mild left sided hydronephrosis. Tiny nonobstructive calculi within lower pole calices of right kidney. Redemonstration of right pelvic renal transplant with severe hydronephrosis with associated hydroureter. Moderately distended urinary bladder with wall thickening.    Interventions:  - Foley catheter placed     Assessment and Plan:     Jacqueline Shields is a 58 y.o. 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     Urinary tract infection   Nausea/ Vomiting   Nonobstructive nephrolithiasis   Severe hydronephrosis of R transplant kidney, chronic   Hydroureter of R transplant kidney , chronic    - Recent renal ultrasound 03/15/2024 showed mild hydroureteronephrosis, decreased since September 2024, progressive decrease in size and cortical thickness of transplant kidney.   CT abdomen/ pelvis: 1.8 cm cavitary lesion in right middle lobe with slightly improved aeration. Interval development of mild left sided hydronephrosis. Tiny nonobstructive calculi within lower pole calices of right kidney. Redemonstration of right pelvic renal transplant with severe hydronephrosis with associated hydroureter. Moderately distended urinary bladder with wall thickening.  - UA: 2+ turbidity, 3+ glucose, 1+ ketones, 3+ protein, 2+ blood, 3+ leukocytes, packed WBC, packed RBC, packed bacteria  PLAN:  > Start IV Ceftriaxone  2 g daily  > Start LR at 125 mL/ hour     Acute kidney injury   End Stage Renal Disease s/p Renal Transplant  07/2020  High Anion Gap Metabolic Acidosis with Respiratory Compensation   - Cr at OSH 12, repeat pending (baseline ~5.2-5.9)   - HCO3: 15 at OSH, repeat pending   - Anion gap: 23  at OSH, repeat pending  - Lactate: 2.4 at OSH, repeat pending   - BHB: pending  - VBG: 7.41/ CO2 30/ HCO3 20   - PTA Everolimus  1 mg BID   - PTA Tacrolimus  1 mg BID  - Suspect AKI is 2/2 UTI vs. Intra-renal etiology   PLAN:  > Start LR at 125 mL/hour as above   > Nephrology transplant consulted, anticipate possible dialysis +/- kidney biopsy 04/22/2024   > Start PTA Everolimus  1 mg BID  > Start PTA Tacrolimus  1 mg BID   > Ultrasound of transplanted kidney ordered   > Daily tacrolimus  levels       Coronary artery disease  Dyslipidemia  - LHC 2021: 40% to 50% percent mid right coronary artery disease.Small branch vessel 80% disease with involving the superior branch of medium caliber diagonal vessel.  > Continue Rosuvastatin  10 mg daily   > Continue PTA ASA 81 mg daily     Chronic anemia of renal disease, stable  - Hemoglobin 10.2, MCV 76.2 on admission  - Baseline 8-8.7   PLAN:  > CTM on daily labs         Insulin  Dependent Type II Diabetes  - A1C: pending  - Sugars on admission: pending    - PTA: Triseba 50 U QHS, Aspart 10 U with breakfast, 8 U with lunch, 10 U with dinner   - PTA: Mounjaro  7.5 q 7 days   PLAN:  > Restart PTA Glargine 50 U QHS, Aspart 10 U with breakfast, 8 U with lunch, 10 U with dinner  > Hold PTA Mounjaro      Nutrition: No Dietician Consult  Wound: No Wound Consult    Diet: Renal  VTE ppx: Heparin   CODE: Full     DISPO: Admit to medicine.    Patient was discussed with Dr. Caleen Edsel [place]ward, DO  Internal Medicine PGY-1  Available on Voalte      Subjective:     Review of Systems:  Negative unless otherwise stated in HPI      Past medical history:  The patient  has a past medical history of Abnormal biopsy of kidney, Anemia, Chronic kidney disease (2019), Coronary artery disease due to lipid rich plaque (07/03/2020), DM (diabetes mellitus), type 2 (CMS-HCC), ESRD (end stage renal disease) (CMS-HCC), Gastroparesis, GERD (gastroesophageal reflux disease), H/O kidney transplant (2021), HLD (hyperlipidemia), Hypertension, Kidney failure, Kidney stones, MGUS (monoclonal gammopathy of unknown significance), Obesity, Proteinuria, PUD (peptic ulcer disease), PVD (peripheral vascular disease), and Urinary tract infection.    Social History     Tobacco Use   ? Smoking status: Never   ? Smokeless tobacco: Never   Vaping Use   ? Vaping status: Never Used   Substance Use Topics   ? Alcohol use: Not Currently   ? Drug use: Never       Past surgical history:  The patient  has a past surgical history that includes hysterectomy (2000); lithotripsy; catheter implant/revision; Kidney transplant (N/A, 07/15/2020); PERITONEAL CATHETER REMOVAL (Bilateral, 08/24/2020); cystourethroscopy (Right, 08/24/2020); Upper gastrointestinal endoscopy (N/A, 04/17/2023); Colonoscopy (N/A, 04/17/2023); cesarean section (1991); tubal ligation (1995); Upper gastrointestinal endoscopy (N/A, 07/21/2023); UROGRAPHY (Right, 09/22/2023); UROGRAPHY (Right, 09/22/2023); cystourethroscopy (Right, 09/22/2023); cystourethroscopy (Right, 11/17/2023); UROGRAPHY (Right, 11/17/2023); cystourethroscopy (Right, 11/17/2023); and cesarean section (1991).    Family History:  family history includes Cancer in her father and sister; Cancer-Hematologic in her father; Diabetes in her father, mother, and sister; Hypertension in her father and mother; Migraines in her  sister.    Objective:   Vital Signs:  BP (!) 148/81 (BP Source: Arm, Right Upper)  - Pulse 86  - Temp 36.4 ?C (97.5 ?F)  - Ht 172.7 cm (5' 8)  - Wt 84.3 kg (185 lb 12.8 oz)  - LMP  (LMP Unknown)  - SpO2 100%  - BMI 28.25 kg/m?     Physical exam:   General: No acute distress. Awake and conversant.   Eyes: Normal conjunctiva, anicteric. Round symmetric pupils.   ENT: Hearing grossly intact. No nasal discharge.   Neck: Neck is supple. No masses or thyromegaly.   Respiratory: Respirations are non-labored. No wheezing.   Skin: Warm. No rashes or ulcers.   Psych: Alert and oriented. Cooperative, Appropriate mood and affect, Normal judgment.   CV: RRR. No murmurs. No lower extremity edema.   GI: Abdomen soft, flat, nontender to palpation. No HSM. Bowel sounds heard throughout.   GU: Foley catheter with orange, turbid urine. Lloyd sign negative.    MSK: Normal ambulation. No clubbing or cyanosis.   Neuro: Sensation grossly normal. No focal deficit.       Labs:  Recent Labs     04/19/24  1047   HGB 9.8*   HCT 28.9*   WBC 9.00   PLTCT 179   NA 137   K 3.3*   CL 104   CO2 17*   BUN 68*   CR 10.93*   GLU 190*   CA 8.5   MG 1.9   PO4 4.8*   ALBUMIN 3.7   TOTPROT 7.8   TOTBILI 0.4   AST 12   ALT 9   ALKPHOS 59        Pertinent Radiology Reviewed.     Meds:  Scheduled Meds:Continuous Infusions:  PRN and Respiratory Meds:

## 2024-04-22 ENCOUNTER — Encounter: Admit: 2024-04-22 | Discharge: 2024-04-22 | Payer: PRIVATE HEALTH INSURANCE

## 2024-04-22 DIAGNOSIS — D849 Immunodeficiency, unspecified: Secondary | ICD-10-CM

## 2024-04-22 DIAGNOSIS — Z940 Kidney transplant status: Principal | ICD-10-CM

## 2024-04-22 LAB — BLOOD GASES, PERIPHERAL VENOUS
~~LOC~~ BKR BASE DEFICIT-VENOUS: 4.6 mmol/L
~~LOC~~ BKR BICARB, VENOUS(CAL): 19 mmol/L
~~LOC~~ BKR O2 SAT, VENOUS: 32 % — ABNORMAL LOW (ref 55.0–71.0)
~~LOC~~ BKR PCO2-VENOUS: 30 mmHg — ABNORMAL LOW (ref 36–50)
~~LOC~~ BKR PH-VENOUS: 7.4 — ABNORMAL HIGH (ref 7.30–7.40)
~~LOC~~ BKR PO2-VENOUS: 20 mmHg — ABNORMAL LOW (ref 33–48)

## 2024-04-22 LAB — BETA HYDROXYBUTYRATE (KETONES)
~~LOC~~ BKR BETA HYDROXYBUTYRATE: 2.2 mmol/L — ABNORMAL HIGH (ref 0.00–<0.3)
~~LOC~~ BKR BETA HYDROXYBUTYRATE: 1.6 mmol/L — ABNORMAL HIGH (ref <0.3)

## 2024-04-22 LAB — CBC AND DIFF
~~LOC~~ BKR ABSOLUTE LYMPH COUNT: 0.6 10*3/uL — ABNORMAL LOW (ref 1.00–4.80)
~~LOC~~ BKR ABSOLUTE MONO COUNT: 0.7 10*3/uL (ref 0.00–0.80)
~~LOC~~ BKR ABSOLUTE NEUTROPHIL: 4.2 10*3/uL — ABNORMAL HIGH (ref 1.80–7.00)
~~LOC~~ BKR BASOPHILS %: 0.4 % — ABNORMAL LOW (ref 0.0–2.0)
~~LOC~~ BKR HEMATOCRIT: 30 % — ABNORMAL LOW (ref 36.0–45.0)
~~LOC~~ BKR HEMATOCRIT: 28 % — ABNORMAL LOW (ref 36.0–45.0)
~~LOC~~ BKR HEMOGLOBIN: 10 g/dL — ABNORMAL LOW (ref 12.0–15.0)
~~LOC~~ BKR MCH: 25 pg — ABNORMAL LOW (ref 26.0–34.0)
~~LOC~~ BKR MCV: 76 fL — ABNORMAL LOW (ref 80.0–100.0)
~~LOC~~ BKR PLATELET COUNT: 217 10*3/uL — ABNORMAL HIGH (ref 150–400)
~~LOC~~ BKR RBC COUNT: 3.9 10*6/uL — ABNORMAL LOW (ref 4.00–5.00)
~~LOC~~ BKR RBC COUNT: 3.7 10*6/uL — ABNORMAL LOW (ref 4.00–5.00)
~~LOC~~ BKR RDW: 17 % — ABNORMAL HIGH (ref 11.0–15.0)
~~LOC~~ BKR WBC COUNT: 5.4 10*3/uL (ref 4.50–11.00)
~~LOC~~ BKR WBC COUNT: 5.2 10*3/uL — ABNORMAL HIGH (ref 4.50–11.00)

## 2024-04-22 LAB — URINALYSIS DIPSTICK REFLEX TO CULTURE
~~LOC~~ BKR NITRITE: NEGATIVE
~~LOC~~ BKR URINE BILE: NEGATIVE /HPF
~~LOC~~ BKR URINE SPEC GRAVITY: 1 /HPF — AB (ref 1.005–1.030)

## 2024-04-22 LAB — COMPREHENSIVE METABOLIC PANEL
~~LOC~~ BKR ALBUMIN: 3.8 g/dL (ref 3.5–5.0)
~~LOC~~ BKR ALBUMIN: 3.6 g/dL — ABNORMAL HIGH (ref 3.5–5.0)
~~LOC~~ BKR ALK PHOSPHATASE: 65 U/L — ABNORMAL LOW (ref 25–110)
~~LOC~~ BKR ALK PHOSPHATASE: 57 U/L — ABNORMAL LOW (ref 25–110)
~~LOC~~ BKR ALT: 9 U/L (ref 7–56)
~~LOC~~ BKR AST: 15 U/L — ABNORMAL HIGH (ref 7–40)
~~LOC~~ BKR BLD UREA NITROGEN: 87 mg/dL — ABNORMAL HIGH (ref 7–25)
~~LOC~~ BKR CALCIUM: 9 mg/dL — ABNORMAL HIGH (ref 8.5–10.6)
~~LOC~~ BKR CREATININE: 11 mg/dL — ABNORMAL HIGH (ref 0.40–1.00)
~~LOC~~ BKR CREATININE: 11 mg/dL — ABNORMAL HIGH (ref 0.40–1.00)
~~LOC~~ BKR GLUCOSE, RANDOM: 173 mg/dL — ABNORMAL HIGH (ref 70–100)
~~LOC~~ BKR POTASSIUM: 3.6 mmol/L — ABNORMAL LOW (ref 3.5–5.1)
~~LOC~~ BKR TOTAL BILIRUBIN: 0.5 mg/dL (ref 0.2–1.3)
~~LOC~~ BKR TOTAL BILIRUBIN: 0.3 mg/dL — ABNORMAL HIGH (ref 0.2–1.3)
~~LOC~~ BKR TOTAL PROTEIN: 7.4 g/dL — ABNORMAL LOW (ref 6.0–8.0)

## 2024-04-22 LAB — URINALYSIS MICROSCOPIC REFLEX TO CULTURE

## 2024-04-22 LAB — POC GLUCOSE: ~~LOC~~ BKR POC GLUCOSE: 168 mg/dL — ABNORMAL HIGH (ref 70–100)

## 2024-04-22 LAB — LACTIC ACID(LACTATE): ~~LOC~~ BKR LACTIC ACID: 1.3 mmol/L (ref 0.5–2.0)

## 2024-04-22 MED ORDER — SODIUM BICARBONATE 650 MG PO TAB
1300 mg | Freq: Two times a day (BID) | ORAL | 0 refills | Status: DC
Start: 2024-04-22 — End: 2024-04-27
  Administered 2024-04-23 – 2024-04-26 (×8): 1300 mg via ORAL

## 2024-04-22 MED ORDER — FAMOTIDINE (PF) 20 MG/2 ML IV SOLN
10 mg | Freq: Once | INTRAVENOUS | 0 refills | Status: CP
Start: 2024-04-22 — End: ?
  Administered 2024-04-22: 15:00:00 10 mg via INTRAVENOUS

## 2024-04-22 MED ORDER — INSULIN GLARGINE 100 UNIT/ML (3 ML) SC INJ PEN
30 [IU] | Freq: Every evening | SUBCUTANEOUS | 0 refills | Status: DC
Start: 2024-04-22 — End: 2024-04-24
  Administered 2024-04-23: 02:00:00 30 [IU] via SUBCUTANEOUS

## 2024-04-22 MED ORDER — CALCIUM CARBONATE 200 MG CALCIUM (500 MG) PO CHEW
500 mg | ORAL | 0 refills | Status: DC | PRN
Start: 2024-04-22 — End: 2024-04-30
  Administered 2024-04-22: 15:00:00 500 mg via ORAL

## 2024-04-22 NOTE — Progress Notes
 General Progress Note    Name: Jacqueline Shields        MRN: [id number]          DOB: 1965-09-06            Age: 58 y.o.  Admission Date: 04/21/2024       LOS: 1 day    Date of Service: 04/22/2024    Assessment/Plan:      Jacqueline Shields is a 58 y.o. 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 Intra-renal etiology  vs rejection  PLAN:  > Start LR at 125 mL/hour as above   > Nephrology transplant consulted, anticipate possible dialysis +/- kidney biopsy 04/22/2024   > Start PTA Everolimus  1 mg BID  > Start PTA Tacrolimus  1 mg BID   > Ultrasound of transplanted kidney ordered   > Daily tacrolimus  levels     Urinary tract infection   Nausea/ Vomiting   Nonobstructive nephrolithiasis   Severe hydronephrosis of R transplant kidney, chronic   Hydroureter of R transplant kidney , chronic    - Recent renal ultrasound 03/15/2024 showed mild hydroureteronephrosis, decreased since September 2024, progressive decrease in size and cortical thickness of transplant kidney.   CT abdomen/ pelvis: 1.8 cm cavitary lesion in right middle lobe with slightly improved aeration. Interval development of mild left sided hydronephrosis. Tiny nonobstructive calculi within lower pole calices of right kidney. Redemonstration of right pelvic renal transplant with severe hydronephrosis with associated hydroureter. Moderately distended urinary bladder with wall thickening.  - UA: 2+ turbidity, 3+ glucose, 1+ ketones, 3+ protein, 2+ blood, 3+ leukocytes, packed WBC, packed RBC, packed bacteria  PLAN:  > Start IV Ceftriaxone  2 g daily  > s/p LR at 125 mL/ hour      Coronary artery disease  Dyslipidemia  - LHC 2021: 40% to 50% percent mid right coronary artery disease.Small branch vessel 80% disease with involving the superior branch of medium caliber diagonal vessel.  > Continue Rosuvastatin  10 mg daily   > Continue PTA ASA 81 mg daily      Chronic anemia of renal disease, stable  - Hemoglobin 10.2, MCV 76.2 on admission  - Baseline 8-8.7      Insulin  Dependent Type II Diabetes  - A1C: pending  - Sugars on admission: pending    - PTA: Triseba 50 U QHS, Aspart 10 U with breakfast, 8 U with lunch, 10 U with dinner   - PTA: Mounjaro  7.5 q 7 days   PLAN:  > Restart PTA Glargine 50 U QHS, Aspart 10 U with breakfast, 8 U with lunch, 10 U with dinner  > Hold PTA Mounjaro       Nutrition: No Dietician Consult  Wound: No Wound Consult     Diet: NPO  VTE ppx: Heparin   CODE: Full   DISPO: Admit to medicine.        [facility] Medicine    Voalte is the preferred method of communication for Internal Medicine. Please search voalte directory for Med Private First Call assigned team name to find the correct covering doctor or nurse practitioner 24/7. You can find the assigned team in the information bar to the left in the patient chart under service.  Personal Voaltes and pagers are not answered at all hours.    High medical decision making due to the following:  1 or more chronic illness with severe exacerbation  Review of notes outside of my specialty, Review of each unique test, and Ordering of each unique test and discussion of management or test interpretation with Renal Transplant (physician(s) or other qualified health care professional outside of my specialty)    Assessment & Plan  Metabolic acidosis    Diabetes mellitus (CMS-HCC)    AKI (acute kidney injury)    Urinary tract infection    High anion gap metabolic acidosis       Present on Admission:   Metabolic acidosis   AKI (acute kidney injury)   Diabetes mellitus (CMS-HCC)     _______________________________________________________________________    Subjective       She was having quite a bit of vomiting and reflux this morning.  Seems to be improved now after the Tums and Pepcid .  She is having some urine output with Foley catheter in place.  She denies previous dysuria or feelings of UTI.  No overt fevers.  Renal transplant team has been consulted I did discuss with them and they are recommending IR consult for renal biopsy of the transplanted kidney          Malnutrition Details:                                                      Active Wounds                                              Medications  Scheduled Meds:aspirin  EC (ASPIR-LOW) tablet 81 mg, 81 mg, Oral, QDAY  cefTRIAXone  (ROCEPHIN ) IVP 2 g, 2 g, Intravenous, Q24H*  everolimus  (immunosuppressive) (ZORTRESS ) tablet 1 mg, 1 mg, Oral, BID(6-18)  heparin  (porcine) PF syringe 5,000 Units, 5,000 Units, Subcutaneous, Q8H  influenza (=>65 Yo) trivalent HIGH DOSE (FLUZONE) 180 mcg/0.5 mL 2025-26 PF syringe 0.5 mL, 0.5 mL, Intramuscular, ONCE  insulin  aspart (U-100) (NOVOLOG  FLEXPEN U-100 INSULIN ) injection PEN 0-6 Units, 0-6 Units, Subcutaneous, ACHS (22)  insulin  aspart (U-100) (NOVOLOG  FLEXPEN U-100 INSULIN ) injection PEN 10 Units, 10 Units, Subcutaneous, QDAY w/breakfast  insulin  aspart (U-100) (NOVOLOG  FLEXPEN U-100 INSULIN ) injection PEN 10 Units, 10 Units, Subcutaneous, QDAY w/dinner  insulin  aspart (U-100) (NOVOLOG  FLEXPEN U-100 INSULIN ) injection PEN 8 Units, 8 Units, Subcutaneous, QDAY w/lunch  insulin  glargine (LANTUS  SOLOSTAR U-100 INSULIN ) injection PEN 50 Units, 50 Units, Subcutaneous, QHS(22)  rosuvastatin  (CRESTOR ) tablet 10 mg, 10 mg, Oral, QDAY  tacrolimus  (PROGRAF ) capsule 1 mg, 1 mg, Oral, BID    Continuous Infusions:   lactated ringers  infusion 125 mL/hr at 04/22/24 0556     PRN and Respiratory Meds:acetaminophen  Q4H PRN, dextrose  50%  PRN, melatonin QHS PRN, ondansetron  Q6H PRN **OR** ondansetron  Q6H PRN, oxyBUTYnin  chloride BID PRN, polyethylene glycol 3350  QDAY PRN, sennosides-docusate sodium  QDAY PRN                Objective                        Vital Signs: Last Filed                 Vital Signs: 24 Hour Range   BP: 154/89 (09/22 0700)  Temp: 36.4 ?C (97.5 ?F) (09/22 0700)  Pulse: 85 (09/22 0700)  Respirations: 17 PER MINUTE (09/22 0700)  SpO2: 100 % (09/22 0700)  O2 Device: None (Room air) (09/22 0700)  Height: 172.7 cm (5' 8) (09/21 1846) BP: (139-154)/(80-90)   Temp:  [36.2 ?C (97.2 ?F)-36.4 ?C (97.5 ?F)]   Pulse:  [76-88]   Respirations:  [17 PER MINUTE-20 PER MINUTE]   SpO2:  [98 %-100 %]   O2 Device: None (Room air)   Intensity Pain Scale (Self Report): 3 (04/22/24 0000) Vitals:    04/21/24 1846   Weight: 84.3 kg (185 lb 12.8 oz)       Intake/Output Summary:  (Last 24 hours)    Intake/Output Summary (Last 24 hours) at 04/22/2024 0850  Last data filed at 04/22/2024 0800  Gross per 24 hour   Intake 600 ml   Output 670 ml   Net -70 ml              Physical Exam  GEN: Awake, alert, NAD  HEENT: EOMI, No scleral icterus,   CV: S1S2 RRR, no murmurs  RESP: No respiratory distress, clear to auscultation, no wheezes or rhonchi  ABD: Soft, nontender, non distended, good bowel sounds  EXT: No LE edema    Lab Review  24-hour labs:    Results for orders placed or performed during the hospital encounter of 04/21/24 (from the past 24 hours)   CBC AND DIFF    Collection Time: 04/21/24  8:46 PM   Result Value Ref Range    White Blood Cells 5.40 4.50 - 11.00 10*3/uL    Red Blood Cells 3.94 (L) 4.00 - 5.00 10*6/uL    Hemoglobin 10.2 (L) 12.0 - 15.0 g/dL    Hematocrit 69.7 (L) 36.0 - 45.0 %    MCV 76.7 (L) 80.0 - 100.0 fL    MCH 25.8 (L) 26.0 - 34.0 pg    MCHC 33.7 32.0 - 36.0 g/dL    RDW 82.1 (H) 88.9 - 15.0 %    Platelet Count 217 150 - 400 10*3/uL    MPV 8.7 7.0 - 11.0 fL    Neutrophils 77.0 41.0 - 77.0 %    Lymphocytes 10.4 (L) 24.0 - 44.0 %    Monocytes 12.1 (H) 4.0 - 12.0 %    Eosinophils 0.1 0.0 - 5.0 %    Basophils 0.4 0.0 - 2.0 %    Absolute Neutrophil Count 4.20 1.80 - 7.00 10*3/uL    Absolute Lymph Count 0.60 (L) 1.00 - 4.80 10*3/uL    Absolute Monocyte Count 0.70 0.00 - 0.80 10*3/uL    Absolute Eosinophil Count 0.00 0.00 - 0.45 10*3/uL    Absolute Basophil Count 0.00 0.00 - 0.20 10*3/uL   COMPREHENSIVE METABOLIC PANEL    Collection Time: 04/21/24  8:46 PM   Result Value Ref Range    Sodium 140 137 - 147 mmol/L    Potassium 3.7 3.5 - 5.1  mmol/L    Chloride 100 98 - 110 mmol/L    Glucose 173 (H) 70 - 100 mg/dL    Blood Urea Nitrogen 87 (H) 7 - 25 mg/dL    Creatinine 88.04 (H) 0.40 - 1.00 mg/dL    Calcium  9.2 8.5 - 10.6 mg/dL    Total Protein 8.2 (H) 6.0 - 8.0 g/dL    Total Bilirubin 0.5 0.2 - 1.3 mg/dL    Albumin 3.8 3.5 - 5.0 g/dL    Alk Phosphatase 65 25 - 110 U/L    AST 15 7 - 40 U/L    ALT 9 7 - 56 U/L    CO2 19 (L) 21 - 30 mmol/L    Anion Gap 21 (H) 3 - 12    Glomerular Filtration Rate (GFR) 3 (L) >60 mL/min   LACTIC ACID(LACTATE)    Collection Time: 04/21/24  8:46 PM   Result Value Ref Range    Lactic Acid 1.3 0.5 - 2.0 mmol/L   BETA HYDROXYBUTYRATE (KETONES)    Collection Time: 04/21/24  8:46 PM   Result Value Ref Range    Beta Hydroxybutyrate 2.2 (H) <0.3 mmol/L   URINALYSIS DIPSTICK REFLEX TO CULTURE    Collection Time: 04/21/24  8:52 PM    Specimen: Midstream; Urine   Result Value Ref Range    Color,UA Red     Turbidity,UA 2+ (A) Clear    Specific Gravity-Urine 1.015 1.005 - 1.030    pH,UA 6.0 5.0 - 8.0    Protein,UA 3+ (A) Negative    Glucose,UA 3+ (A) Negative    Ketones,UA 1+ (A) Negative    Bilirubin,UA Negative Negative    Blood,UA 2+ (A) Negative    Urobilinogen,UA Normal Normal    Nitrite,UA Negative Negative    Leukocytes,UA 3+ (A) Negative   URINALYSIS MICROSCOPIC REFLEX TO CULTURE    Collection Time: 04/21/24  8:52 PM Specimen: Midstream; Urine   Result Value Ref Range    WBCs,UA Packed (A) None, 0 - 2  /HPF    RBCs,UA Packed (A) None, 0 - 2  /HPF    Mucous,UA Trace None, Trace /LPF    Bacteria,UA Packed (A) None /HPF    WBC Clumps Present (A) None /HPF    Squamous Epithelial Cells 0 - 2 None, 0 - 2 , 2 - 5 /HPF   BLOOD GASES, PERIPHERAL VENOUS    Collection Time: 04/21/24  8:57 PM   Result Value Ref Range    pH 7.41 (H) 7.30 - 7.40    PCO2 30 (L) 36 - 50 mmHg    PO2 20 (L) 33 - 48 mmHg    Base Deficit 4.6 mmol/L    O2Sat 32.4 (L) 55.0 - 71.0 %    Bicarbonate 19.6 mmol/L    FiO2 Value Venous     POC GLUCOSE    Collection Time: 04/21/24  9:49 PM   Result Value Ref Range    Glucose, POC 168 (H) 70 - 100 mg/dL   CBC AND DIFF    Collection Time: 04/22/24  5:36 AM   Result Value Ref Range    White Blood Cells 5.20 4.50 - 11.00 10*3/uL    Red Blood Cells 3.73 (L) 4.00 - 5.00 10*6/uL    Hemoglobin 9.5 (L) 12.0 - 15.0 g/dL    Hematocrit 71.3 (L) 36.0 - 45.0 %    MCV 76.9 (L) 80.0 - 100.0 fL    MCH 25.5 (L) 26.0 - 34.0 pg    MCHC 33.2  32.0 - 36.0 g/dL    RDW 82.5 (H) 88.9 - 15.0 %    Platelet Count 214 150 - 400 10*3/uL    MPV 8.4 7.0 - 11.0 fL    Neutrophils 74.0 41.0 - 77.0 %    Lymphocytes 13.0 (L) 24.0 - 44.0 %    Monocytes 12.3 (H) 4.0 - 12.0 %    Eosinophils 0.3 0.0 - 5.0 %    Basophils 0.4 0.0 - 2.0 %    Absolute Neutrophil Count 3.80 1.80 - 7.00 10*3/uL    Absolute Lymph Count 0.70 (L) 1.00 - 4.80 10*3/uL    Absolute Monocyte Count 0.60 0.00 - 0.80 10*3/uL    Absolute Eosinophil Count 0.00 0.00 - 0.45 10*3/uL    Absolute Basophil Count 0.00 0.00 - 0.20 10*3/uL    MDW (Monocyte Distribution Width) 19.2 <=20.6   COMPREHENSIVE METABOLIC PANEL    Collection Time: 04/22/24  5:36 AM   Result Value Ref Range    Sodium 139 137 - 147 mmol/L    Potassium 3.6 3.5 - 5.1 mmol/L    Chloride 102 98 - 110 mmol/L    Glucose 168 (H) 70 - 100 mg/dL    Blood Urea Nitrogen 86 (H) 7 - 25 mg/dL    Creatinine 88.85 (H) 0.40 - 1.00 mg/dL    Calcium  9.0 8.5 - 10.6 mg/dL    Total Protein 7.4 6.0 - 8.0 g/dL    Total Bilirubin 0.3 0.2 - 1.3 mg/dL    Albumin 3.6 3.5 - 5.0 g/dL    Alk Phosphatase 57 25 - 110 U/L    AST 10 7 - 40 U/L    ALT 6 (L) 7 - 56 U/L    CO2 18 (L) 21 - 30 mmol/L    Anion Gap 19 (H) 3 - 12    Glomerular Filtration Rate (GFR) 4 (L) >60 mL/min   MAGNESIUM     Collection Time: 04/22/24  5:36 AM   Result Value Ref Range    Magnesium  2.1 1.6 - 2.6 mg/dL   PHOSPHORUS    Collection Time: 04/22/24  5:36 AM   Result Value Ref Range    Phosphorus 5.5 (H) 2.0 - 4.5 mg/dL   BETA HYDROXYBUTYRATE (KETONES)    Collection Time: 04/22/24  5:36 AM   Result Value Ref Range    Beta Hydroxybutyrate 1.6 (H) <0.3 mmol/L       Point of Care Testing  (Last 24 hours)  Glucose: (!) 168 (04/22/24 0536)  POC Glucose (Download): (!) 168 (04/21/24 2149)    Radiology and other Diagnostics Review:    Last Images past 24 hours:   Radiology  (Last 24 hours)                 04/21/24 2335  US  KIDNEY TRANSPLANT (Abnormal) Final result    Impression:          Diminishing now minimal transplant hydronephrosis       Visualization of echogenic areas within the right renal collecting system. This finding is indeterminate between rapidly developing renal calculi or gas in the collecting system, which may have refluxed from the bladder, If there has been recent catheterization. Gas forming infection is less likely.       Patent renal vessels       If clinically indicated, a noncontrast CT scan of the abdomen and pelvis is recommended to differentiate between gas and calculi in the transplant renal collecting system.       #FOLLOW  Finalized by Maurie Houston, M.D. on 04/22/2024 8:44 AM. Dictated by Maurie Houston, M.D. on 04/22/2024 8:23 AM.

## 2024-04-22 NOTE — Care Coordination-Inpatient
 Med Teaching TBD [phone] will take calls on this patient until 8:00 AM. Afterwards, please contact first on call for the designated service listed on chart.         AOD

## 2024-04-22 NOTE — Case Management (ED)
 Case Management Admission Assessment    NAME:Jacqueline Shields                          MRN: [id number]             DOB:Dec 26, 1965          AGE: 58 y.o.  ADMISSION DATE: 04/21/2024             DAYS ADMITTED: LOS: 1 day      Today?s Date: 04/22/2024    Source of Information: Pt and EMR       Plan  Plan: Case Management Assessment, Assist PRN with SW/NCM Services  This CM met with pt for assessment on this date.  Provided contact information and explanation of SW/NCM roles.  Reviewed [facility], Preparing for Discharge. Provided opportunity for questions and discussion. Pt/family encouraged to contact Case Management team with questions and concerns during hospitalization and until patient is able to transition back to the patient's primary care physician.  NCM verified demographics with pt.   Pt lives in a single level home with her husband. Pt is independent with all ADL's.   Pt drives herself to and from medical appointments.   Pt has a glucometer and dexacom at home. Pt self administers her insulin  and has all supplies at home for insulin  and glucometer.   Pt denies any history of HH or inpatient setting.   [facility] and states her medications are affordable.   Pt's spouse will provide transportation at time of DC.      Patient Address/Phone  [address]  [place] [address]  [phone] (home)     Emergency Contact  Extended Emergency Contact Information  Primary Emergency Contact: Jacqueline Shields  Home Phone: [phone]  Mobile Phone: [phone]  Relation: Spouse    Healthcare Directive         Transportation  Does the Patient Need Case Management to Arrange Discharge Transport? (ex: facility, ambulance, wheelchair/stretcher, Medicaid, cab, other): No  Will the Patient Use Family Transport?: Yes  Transportation Name, Phone and Availability #1: HusbandGLENWOOD Shields    Expected Discharge Date  04/25/2024     Living Situation Prior to Admission  Living Arrangements  Type of Residence: Home, independent  Living Arrangements: Spouse/significant other  Financial risk analyst / Tub: Tub/Shower Unit  How many levels in the residence?: 1  Can patient live on one level if needed?: Yes  Does residence have entry and/or inside stairs?: No  Assistance needed prior to admit or anticipated on discharge: No  Can support system provide 24/7 care if needed?: No  Level of Function   Prior level of function: Independent  Cognitive Abilities   Cognitive Abilities: Alert and Oriented, Participates in decision making, Engages in problem solving and Pensions consultant  Primary Insurance: Nurse, learning disability ([employer])  Secondary Insurance: No insurance  Medication Coverage    Medication Coverage: Nurse, learning disability  Have you experienced a noticeable increase in your copay costs recently?: No  Are current medications affordable?: Yes  Do You Use a Co-Pay Card or a Medication Assistance Program to Help Manage Medication Costs?: No  Do You Manage Your Own Medications?: Yes  Source of Income   Source Of Income: Employed  Financial Assistance Needed?  N/A    Psychosocial Needs  Mental Health  Mental Health History: No  Substance Use History  Substance Use History Screen: No  Other  N/A    Current/Previous Services  PCP  Jacqueline Shields, [phone], [phone]      LAst seen July 2025  Pharmacy    [facility]  [address], [address]  [place] [address]  Phone: [phone] Fax: [phone]    [id number] [place] [place] - [address]  [address] [place]  [place] [address]  Phone: [phone] Fax: [phone]    Durable Medical Equipment   Durable Medical Equipment at home: Other (comment) (Glucometer and dexacom)  Home Health  Receiving home health: No  Hemodialysis or Peritoneal Dialysis  Undergoing hemodialysis or peritoneal dialysis: No  Tube/Enteral Feeds  Receive tube/enteral feeds: No  Infusion  Receive infusions: No  Private Duty  Private duty help used: No  Home and Community Based Services  Home and community based services: No  Ryan White  Ryan White: N/A  Hospice  Hospice: No  Outpatient Therapy  PT: No  OT: No  SLP: No  Skilled Nursing Facility/Nursing Home  SNF: No  NH: No  Inpatient Rehab  IPR: No  Long-Term Acute Care Hospital  LTACH: No  Acute Hospital Stay  Acute Hospital Stay: In the past  Was patient's stay within the last 30 days?: No      Jacqueline Shields BSN, Conservation officer, nature on Voalte  [phone]

## 2024-04-23 ENCOUNTER — Encounter: Admit: 2024-04-23 | Discharge: 2024-04-23 | Payer: PRIVATE HEALTH INSURANCE

## 2024-04-23 ENCOUNTER — Inpatient Hospital Stay: Admit: 2024-04-23 | Discharge: 2024-04-23 | Payer: PRIVATE HEALTH INSURANCE

## 2024-04-23 LAB — COMPREHENSIVE METABOLIC PANEL
~~LOC~~ BKR ALBUMIN: 3.4 g/dL — ABNORMAL LOW (ref 3.5–5.0)
~~LOC~~ BKR ALK PHOSPHATASE: 59 U/L (ref 25–110)
~~LOC~~ BKR CALCIUM: 9.1 mg/dL — ABNORMAL HIGH (ref 8.5–10.6)
~~LOC~~ BKR CREATININE: 10 mg/dL — ABNORMAL HIGH (ref 0.40–1.00)
~~LOC~~ BKR TOTAL BILIRUBIN: 0.3 mg/dL — ABNORMAL HIGH (ref 0.2–1.3)
~~LOC~~ BKR TOTAL PROTEIN: 7.3 g/dL — ABNORMAL LOW (ref <=3.4)

## 2024-04-23 LAB — CBC
~~LOC~~ BKR HEMATOCRIT: 21 % — ABNORMAL LOW (ref 36.0–45.0)
~~LOC~~ BKR HEMOGLOBIN: 7.3 g/dL — ABNORMAL LOW (ref 12.0–15.0)
~~LOC~~ BKR MCH: 26 pg (ref 26.0–34.0)
~~LOC~~ BKR MCHC: 33 g/dL (ref 32.0–36.0)
~~LOC~~ BKR MCV: 78 fL — ABNORMAL LOW (ref 80.0–100.0)
~~LOC~~ BKR MPV: 8.2 fL (ref 7.0–11.0)
~~LOC~~ BKR PLATELET COUNT: 290 10*3/uL (ref 150–400)
~~LOC~~ BKR RBC COUNT: 2.7 10*6/uL — ABNORMAL LOW (ref 4.00–5.00)
~~LOC~~ BKR RDW: 17 % — ABNORMAL HIGH (ref 11.0–15.0)
~~LOC~~ BKR WBC COUNT: 5.3 10*3/uL (ref 4.50–11.00)

## 2024-04-23 LAB — CBC AND DIFF
~~LOC~~ BKR ABSOLUTE NEUTROPHIL: 3.4 10*3/uL — ABNORMAL LOW (ref 1.80–7.00)
~~LOC~~ BKR HEMATOCRIT: 29 % — ABNORMAL LOW (ref 36.0–45.0)
~~LOC~~ BKR HEMOGLOBIN: 9.9 g/dL — ABNORMAL LOW (ref 12.0–15.0)
~~LOC~~ BKR MCH: 26 pg — ABNORMAL LOW (ref 26.0–34.0)
~~LOC~~ BKR MCHC: 33 g/dL — ABNORMAL HIGH (ref 32.0–36.0)
~~LOC~~ BKR MCV: 78 fL — ABNORMAL LOW (ref 80.0–100.0)
~~LOC~~ BKR PLATELET COUNT: 233 10*3/uL — ABNORMAL HIGH (ref 150–400)
~~LOC~~ BKR RBC COUNT: 3.7 10*6/uL — ABNORMAL LOW (ref 4.00–5.00)
~~LOC~~ BKR WBC COUNT: 4.8 10*3/uL — ABNORMAL LOW (ref 4.50–11.00)

## 2024-04-23 LAB — POC GLUCOSE
~~LOC~~ BKR POC GLUCOSE: 152 mg/dL — ABNORMAL HIGH (ref 70–100)
~~LOC~~ BKR POC GLUCOSE: 140 mg/dL — ABNORMAL HIGH (ref 70–100)
~~LOC~~ BKR POC GLUCOSE: 157 mg/dL — ABNORMAL HIGH (ref 70–100)
~~LOC~~ BKR POC GLUCOSE: 158 mg/dL — ABNORMAL HIGH (ref 70–100)
~~LOC~~ BKR POC GLUCOSE: 167 mg/dL — ABNORMAL HIGH (ref 70–100)

## 2024-04-23 LAB — CULTURE-URINE W/SENSITIVITY: ~~LOC~~ BKR URINE CULTURE: 10 — AB

## 2024-04-23 LAB — MAGNESIUM: ~~LOC~~ BKR MAGNESIUM: 2.2 mg/dL — ABNORMAL HIGH (ref 1.6–2.6)

## 2024-04-23 MED ORDER — FENTANYL CITRATE (PF) 50 MCG/ML IJ SOLN
0 refills | Status: CP
Start: 2024-04-23 — End: ?

## 2024-04-23 MED ORDER — MIDAZOLAM 1 MG/ML IJ SOLN
1 mg | Freq: Once | INTRAVENOUS | 0 refills | Status: CP
Start: 2024-04-23 — End: ?
  Administered 2024-04-23: 14:00:00 1 mg via INTRAVENOUS

## 2024-04-23 MED ORDER — MIDAZOLAM 1 MG/ML IJ SOLN
0 refills | Status: CP
Start: 2024-04-23 — End: ?

## 2024-04-23 NOTE — Unmapped
 Immediate Post Procedure Note    Date:  04/23/2024                                         Attending Physician:   Juliene Endow  Performing Provider:  Juliene GORMAN Endow, MD    Consent:  Consent obtained from patient.  Time out performed: Consent obtained, correct patient verified, correct procedure verified, correct site verified, patient marked as necessary.  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

## 2024-04-23 NOTE — Progress Notes
 General Progress Note    Name: Jacqueline Shields        MRN: [id number]          DOB: 1966/05/18            Age: 58 y.o.  Admission Date: 04/21/2024       LOS: 2 days    Date of Service: 04/23/2024    Assessment/Plan:      Jacqueline Shields is a 58 y.o. 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 Intra-renal etiology  vs rejection  PLAN:  > Start LR at 125 mL/hour as above   > Nephrology transplant consulted, anticipate possible dialysis +/- kidney biopsy 04/22/2024   > Start PTA Everolimus  1 mg BID  > Start PTA Tacrolimus  1 mg BID   > Ultrasound of transplanted kidney ordered   > Daily tacrolimus  levels   > IR renal biopsy on 9/23    Urinary tract infection   Nausea/ Vomiting   Nonobstructive nephrolithiasis   Severe hydronephrosis of R transplant kidney, chronic   Hydroureter of R transplant kidney , chronic    - Recent renal ultrasound 03/15/2024 showed mild hydroureteronephrosis, decreased since September 2024, progressive decrease in size and cortical thickness of transplant kidney.   CT abdomen/ pelvis: 1.8 cm cavitary lesion in right middle lobe with slightly improved aeration. Interval development of mild left sided hydronephrosis. Tiny nonobstructive calculi within lower pole calices of right kidney. Redemonstration of right pelvic renal transplant with severe hydronephrosis with associated hydroureter. Moderately distended urinary bladder with wall thickening.  - UA: 2+ turbidity, 3+ glucose, 1+ ketones, 3+ protein, 2+ blood, 3+ leukocytes, packed WBC, packed RBC, packed bacteria  PLAN:  > Start IV Ceftriaxone  2 g daily  > s/p LR at 125 mL/ hour      Coronary artery disease  Dyslipidemia  - LHC 2021: 40% to 50% percent mid right coronary artery disease.Small branch vessel 80% disease with involving the superior branch of medium caliber diagonal vessel.  > Continue Rosuvastatin  10 mg daily   > HOLD PTA ASA 81 mg daily for renal biopsy     Chronic anemia of renal disease, stable  - Hemoglobin 10.2, MCV 76.2 on admission  - Baseline 8-8.7      Insulin  Dependent Type II Diabetes  - A1C: pending  - Sugars on admission: pending    - PTA: Triseba 50 U QHS, Aspart 10 U with breakfast, 8 U with lunch, 10 U with dinner   - PTA: Mounjaro  7.5 q 7 days   PLAN:  > Restart PTA Glargine 50 U QHS, Aspart 10 U with breakfast, 8 U with lunch, 10 U with dinner  > Hold PTA Mounjaro       Nutrition: No Dietician Consult  Wound: No Wound Consult  Diet: Renal  VTE ppx: Heparin  on hold for renal biopsy  CODE: Full   DISPO: Admit to medicine.      [facility] Medicine    Voalte is the preferred method of communication for Internal Medicine. Please search voalte directory for Med Private First Call assigned team name to find the correct covering doctor or nurse practitioner 24/7. You can find the assigned team in the information bar to the left in the patient chart under service.  Personal Voaltes and pagers are not answered at all hours.    Total Time Today was 55 minutes in the following activities: Preparing to see the patient, Obtaining and/or reviewing separately obtained history, Performing a medically appropriate examination and/or evaluation, Counseling and educating the patient/family/caregiver, Ordering medications, tests, or procedures, Referring and communication with other health care professionals (when not separately reported), Documenting clinical information in the electronic or other health record, and Care coordination (not separately reported)      Assessment & Plan  Metabolic acidosis    Diabetes mellitus (CMS-HCC)    AKI (acute kidney injury)    Urinary tract infection    High anion gap metabolic acidosis       Present on Admission:   Metabolic acidosis   AKI (acute kidney injury)   Diabetes mellitus (CMS-HCC)     _______________________________________________________________________    Subjective       Seen and evaluated after her interventional radiology renal biopsy.  She is doing okay just feels groggy from the sedation.  Vitals are stable.  Repeat hemoglobin at 230.  She is on bedrest currently.  She is making urine and her creatinine has stabilized.          Malnutrition Details:                                                      Active Wounds                                              Medications  Scheduled Meds:[Held by Provider] aspirin  EC (ASPIR-LOW) tablet 81 mg, 81 mg, Oral, QDAY  cefTRIAXone  (ROCEPHIN ) IVP 2 g, 2 g, Intravenous, Q24H*  everolimus  (immunosuppressive) (ZORTRESS ) tablet 1 mg, 1 mg, Oral, BID(6-18)  [Held by Provider] heparin  (porcine) PF syringe 5,000 Units, 5,000 Units, Subcutaneous, Q8H  [Transfer Hold] influenza (=>65 Yo) trivalent HIGH DOSE (FLUZONE) 180 mcg/0.5 mL 2025-26 PF syringe 0.5 mL, 0.5 mL, Intramuscular, ONCE  [Transfer Hold] insulin  aspart (U-100) (NOVOLOG  FLEXPEN U-100 INSULIN ) injection PEN 0-6 Units, 0-6 Units, Subcutaneous, ACHS (22)  [Transfer Hold] insulin  aspart (U-100) (NOVOLOG  FLEXPEN U-100 INSULIN ) injection PEN 10 Units, 10 Units, Subcutaneous, QDAY w/breakfast  [Transfer Hold] insulin  aspart (U-100) (NOVOLOG  FLEXPEN U-100 INSULIN ) injection PEN 10 Units, 10 Units, Subcutaneous, QDAY w/dinner  [Transfer Hold] insulin  aspart (U-100) (NOVOLOG  FLEXPEN U-100 INSULIN ) injection PEN 8 Units, 8 Units, Subcutaneous, QDAY w/lunch  [Transfer Hold] insulin  glargine (LANTUS  SOLOSTAR U-100 INSULIN ) injection PEN 30 Units, 30 Units, Subcutaneous, QHS(22)  [Transfer Hold] rosuvastatin  (CRESTOR ) tablet 10 mg, 10 mg, Oral, QDAY  [Transfer Hold] sodium bicarbonate  tablet 1,300 mg, 1,300 mg, Oral, BID  tacrolimus  (PROGRAF ) capsule 1 mg, 1 mg, Oral, BID  Continuous Infusions:      PRN and Respiratory Meds:[Transfer Hold] acetaminophen  Q4H PRN, [Transfer Hold] calcium  carbonate Q4H PRN, dextrose  50% PRN, [Transfer Hold] melatonin QHS PRN, [Transfer Hold] ondansetron  Q6H PRN **OR** [Transfer Hold] ondansetron  Q6H PRN, [Transfer Hold] oxyBUTYnin  chloride BID PRN, [Transfer Hold] polyethylene glycol 3350  QDAY PRN, [Transfer Hold] sennosides-docusate sodium  QDAY PRN                Objective                        Vital Signs: Last Filed                 Vital Signs: 24 Hour Range   BP: 140/80 (09/23 0845)  Temp: 36.6 ?C (97.8 ?F) (09/23 0815)  Pulse: 84 (09/23 0845)  Respirations: 17 PER MINUTE (09/23 0845)  SpO2: 98 % (09/23 0845)  O2 Device: None (Room air) (09/23 0815) BP: (124-150)/(79-100)   Temp:  [36.5 ?C (97.7 ?F)-36.8 ?C (98.2 ?F)]   Pulse:  [83-89]   Respirations:  [11 PER MINUTE-20 PER MINUTE]   SpO2:  [98 %-100 %]   O2 Device: None (Room air)     Vitals:    04/21/24 1846   Weight: 84.3 kg (185 lb 12.8 oz)       Intake/Output Summary:  (Last 24 hours)    Intake/Output Summary (Last 24 hours) at 04/23/2024 0859  Last data filed at 04/23/2024 0735  Gross per 24 hour   Intake 150 ml   Output 2050 ml   Net -1900 ml     Stool Occurrence: 0        Physical Exam  GEN: Awake, alert, NAD, laying flat in bed  CV: S1S2 RRR, no murmurs  RESP: No respiratory distress, clear to auscultation, no wheezes or rhonchi  ABD: Soft, nontender, non distended, good bowel sounds  EXT: No LE edema    Lab Review  24-hour labs:    Results for orders placed or performed during the hospital encounter of 04/21/24 (from the past 24 hours)   POC GLUCOSE    Collection Time: 04/22/24 10:22 AM   Result Value Ref Range Glucose, POC 167 (H) 70 - 100 mg/dL   POC GLUCOSE    Collection Time: 04/22/24  5:04 PM   Result Value Ref Range    Glucose, POC 158 (H) 70 - 100 mg/dL   POC GLUCOSE    Collection Time: 04/22/24  9:10 PM   Result Value Ref Range    Glucose, POC 152 (H) 70 - 100 mg/dL   CBC AND DIFF    Collection Time: 04/23/24  5:56 AM   Result Value Ref Range    White Blood Cells 4.80 4.50 - 11.00 10*3/uL    Red Blood Cells 3.77 (L) 4.00 - 5.00 10*6/uL    Hemoglobin 9.9 (L) 12.0 - 15.0 g/dL    Hematocrit 70.4 (L) 36.0 - 45.0 %    MCV 78.2 (L) 80.0 - 100.0 fL    MCH 26.3 26.0 - 34.0 pg    MCHC 33.7 32.0 - 36.0 g/dL    RDW 82.7 (H) 88.9 - 15.0 %    Platelet Count 233 150 - 400 10*3/uL    MPV 8.2 7.0 - 11.0 fL    Neutrophils 71.3 41.0 - 77.0 %    Lymphocytes 14.3 (L) 24.0 - 44.0 %    Monocytes 13.0 (H) 4.0 - 12.0 %    Eosinophils 1.1 0.0 -  5.0 %    Basophils 0.3 0.0 - 2.0 %    Absolute Neutrophil Count 3.40 1.80 - 7.00 10*3/uL    Absolute Lymph Count 0.70 (L) 1.00 - 4.80 10*3/uL    Absolute Monocyte Count 0.60 0.00 - 0.80 10*3/uL    Absolute Eosinophil Count 0.10 0.00 - 0.45 10*3/uL    Absolute Basophil Count 0.00 0.00 - 0.20 10*3/uL    MDW (Monocyte Distribution Width) 18.8 <=20.6   COMPREHENSIVE METABOLIC PANEL    Collection Time: 04/23/24  5:56 AM   Result Value Ref Range    Sodium 142 137 - 147 mmol/L    Potassium 3.3 (L) 3.5 - 5.1 mmol/L    Chloride 105 98 - 110 mmol/L    Glucose 144 (H) 70 - 100 mg/dL    Blood Urea Nitrogen 78 (H) 7 - 25 mg/dL    Creatinine 89.56 (H) 0.40 - 1.00 mg/dL    Calcium  9.1 8.5 - 10.6 mg/dL    Total Protein 7.3 6.0 - 8.0 g/dL    Total Bilirubin 0.3 0.2 - 1.3 mg/dL    Albumin 3.4 (L) 3.5 - 5.0 g/dL    Alk Phosphatase 59 25 - 110 U/L    AST 11 7 - 40 U/L    ALT 8 7 - 56 U/L    CO2 17 (L) 21 - 30 mmol/L    Anion Gap 20 (H) 3 - 12    Glomerular Filtration Rate (GFR) 4 (L) >60 mL/min   MAGNESIUM     Collection Time: 04/23/24  5:56 AM   Result Value Ref Range    Magnesium  2.2 1.6 - 2.6 mg/dL   PHOSPHORUS Collection Time: 04/23/24  5:56 AM   Result Value Ref Range    Phosphorus 5.1 (H) 2.0 - 4.5 mg/dL   PROTIME INR (PT)    Collection Time: 04/23/24  5:56 AM   Result Value Ref Range    Protime 13.9 9.9 - 14.2 Seconds    INR 1.2 0.9 - 1.2   POC GLUCOSE    Collection Time: 04/23/24  6:05 AM   Result Value Ref Range    Glucose, POC 140 (H) 70 - 100 mg/dL       Point of Care Testing  (Last 24 hours)  Glucose: (!) 144 (04/23/24 0556)  POC Glucose (Download): (!) 140 (04/23/24 9394)    Radiology and other Diagnostics Review:    Last Images past 24 hours:

## 2024-04-23 NOTE — Nursing Note
 Sedation physician present in room. Recent vitals and patient condition reviewed between sedating physician and nurse. Reassessment completed. Determination made to proceed with planned sedation.

## 2024-04-23 NOTE — Consults
 IR Pre-Procedure History and Physical/Sedation Plan    Procedure Date: 04/23/2024     Planned Procedure(s):  Transplanted kidney biopsy    Procedural code status: Full Code    Indication:  work up  __________________________________________________________________    Chief Complaint:  worsening AKI    History of Present Illness: Jacqueline Shields is a 58 y.o. female with a history as listed below who presents today for procedure.    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    Diarrhea 04/18/2023    Weight loss 04/18/2023    Ulcer of esophagus without bleeding 04/18/2023    Gastric ulcer without hemorrhage or perforation 04/18/2023    Colonic ulcer 04/18/2023    Severe malnutrition 04/17/2023    Metabolic acidosis 01/20/2021    Status post biopsy of kidney 10/21/2020    Dyslipidemia 08/03/2020    ATN (acute tubular necrosis) 07/30/2020    Obesity 07/15/2020    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    Immunosuppression 07/15/2020    Coronary artery disease due to lipid rich plaque 07/03/2020    PVD (peripheral vascular disease) 06/15/2020    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    Hypertension     ESRD (end stage renal disease) (CMS-HCC)     Diabetes mellitus (CMS-HCC)      Past Medical History:    Abnormal biopsy of kidney    Anemia    Chronic kidney disease    Coronary artery disease due to lipid rich plaque    DM (diabetes mellitus), type 2 (CMS-HCC)    ESRD (end stage renal disease) (CMS-HCC)    Gastroparesis    GERD (gastroesophageal reflux disease)    H/O kidney transplant    HLD (hyperlipidemia)    Hypertension    Kidney failure    Kidney stones    MGUS (monoclonal gammopathy of unknown significance)    Obesity    Proteinuria    PUD (peptic ulcer disease)    PVD (peripheral vascular disease)    Urinary tract infection      Surgical History:   Procedure Laterality Date    ANGIOGRAPHY CORONARY ARTERY WITH LEFT HEART CATHETERIZATION N/A 07/03/2020    Performed by Gupta, Kamal, MD at [facility] CATH LAB    POSSIBLE PERCUTANEOUS CORONARY STENT PLACEMENT WITH ANGIOPLASTY N/A 07/03/2020    Performed by Charlanne Francois, MD at [facility] CATH LAB    ALLOTRANSPLANTATION KIDNEY FROM NON LIVING DONOR WITHOUT RECIPIENT NEPHRECTOMY N/A 07/15/2020    Performed by Terrie Sieving, MD at [facility] OR    REMOVAL TUNNELED INTRAPERITONEAL CATHETER Bilateral 08/24/2020    Performed by Terrie Sieving, MD at [facility] OR    CYSTOURETHROSCOPY WITH REMOVAL FOREIGN BODY/ CALCULUS/ STENT FROM URETHRA/ BLADDER - SIMPLE Right 08/24/2020    Performed by Carolynn Ruther ORN, MD at [facility] OR    ESOPHAGOGASTRODUODENOSCOPY WITH BIOPSY - FLEXIBLE N/A 04/17/2023    Performed by Chipper Neas, MD at [facility] ENDO    COLONOSCOPY WITH BIOPSY - FLEXIBLE N/A 04/17/2023    Performed by Chipper Neas, MD at [facility] ENDO    ESOPHAGOGASTRODUODENOSCOPY WITH SPECIMEN COLLECTION BY BRUSHING/ WASHING N/A 07/21/2023    Performed by Arlan Nancyann PARAS, MD at [facility]  ENDO    ANTEGRADE UROGRAPHY Right 09/22/2023    Performed by Erskin Alm LABOR, MD at [facility] OR    RETROGRADE UROGRAPHY WITH/ WITHOUT KUB Right 09/22/2023    Performed by Erskin Alm LABOR, MD at [facility] OR    REMOVAL, NEPHROSTOMY TUBE, WITH FLUOROSCOPIC GUIDANCE Right 09/22/2023    Performed by Erskin Alm LABOR, MD at [facility] OR    CYSTOURETHROSCOPY WITH URETEROSCOPY WITH TREATMENT URETERAL STRICTURE Right 09/22/2023    Performed by Erskin Alm LABOR, MD at [facility] OR    CYSTOURETHROSCOPY WITH URETEROSCOPY AND/ OR PYELOSCOPY - DIAGNOSTIC Right 11/17/2023    Performed by Erskin Alm LABOR, MD at [facility] OR    RETROGRADE UROGRAPHY WITH/ WITHOUT KUB Right 11/17/2023    Performed by Erskin Alm LABOR, MD at [facility] OR    CYSTOURETHROSCOPY WITH REMOVAL FOREIGN BODY/ CALCULUS/ STENT FROM URETHRA/ BLADDER - SIMPLE Right 11/17/2023    Performed by Erskin Alm LABOR, MD at [facility] OR    CATHETER IMPLANT/REVISION      PD cath    CESAREAN SECTION  1991    HX CESAREAN SECTION  1991    HX HYSTERECTOMY  2000    still has ovaries    HX LITHOTRIPSY      HX TUBAL LIGATION  1995      Social History     Tobacco Use    Smoking status: Never    Smokeless tobacco: Never   Substance Use Topics    Alcohol use: Not Currently      Family History   Problem Relation Name Age of Onset    Diabetes Mother Mom     Hypertension Mother Mom     Diabetes Father Lukemia     Hypertension Father Lukemia     Cancer-Hematologic Father Lukemia     Cancer Father Lukemia     Diabetes Sister Macario     Migraines Sister Macario     Cancer Sister [facility]         Endometrial      Prescriptions Prior to Admission[1]  Allergies[2]    Review of Systems  A comprehensive review of systems was negative.    Previous Personal Anesthetic/Sedation History:  Denies adverse events related to sedation/anesthesia.     Previous Family Anesthetic/Sedation History: Denies adverse events related to sedation/anesthesia.    Physical Exam:  Vital Signs: Last Filed In 24 Hours Vital Signs: 24 Hour Range   BP: 130/91 (09/23 0700)  Temp: 36.5 ?C (97.7 ?F) (09/23 0700)  Pulse: 88 (09/23 0700)  Respirations: 13 PER MINUTE (09/23 0700)  SpO2: 98 % (09/23 0700)  O2 Device: None (Room air) (09/23 0700) BP: (124-150)/(79-100)   Temp:  [36.5 ?C (97.7 ?F)-36.8 ?C (98.2 ?F)]   Pulse:  [83-89]   Respirations:  [11 PER MINUTE-20 PER MINUTE]   SpO2:  [98 %-100 %]   O2 Device: None (Room air)          General appearance: Alert and no distress noted.  Neurologic: Grossly normal.  Lungs: Non labored at rest.  Heart: Regular rate and rhythm  Abdomen: Non-distended    Airway:  airway assessment performed  Mallampati II (soft palate, uvula, fauces visible)   Anesthesia Classification:  ASA III (A patient with a severe systemic disease that limits activity, but is not incapacitating)  Pre procedure anxiolysis plan: Midazolam   Intra-procedural Sedation/Medication Plan: Fentanyl , Lidocaine , and Midazolam   Personal history of sedation complications: Denies adverse event.   Family history of sedation complications: Denies adverse event.  Medications for Reversal: Naloxone and Flumazenil  Discussion/Reviews:  Physician has discussed risks and alternatives of this type of sedation and above planned procedures with patient  NPO Status: Acceptable  Pregnancy Status: N/A   Lab/Radiology/Other Diagnostic Tests:  Labs:  24-hour labs:    Results for orders placed or performed during the hospital encounter of 04/21/24 (from the past 24 hours)   POC GLUCOSE    Collection Time: 04/22/24 10:22 AM   Result Value Ref Range    Glucose, POC 167 (H) 70 - 100 mg/dL   POC GLUCOSE    Collection Time: 04/22/24  5:04 PM   Result Value Ref Range    Glucose, POC 158 (H) 70 - 100 mg/dL   POC GLUCOSE    Collection Time: 04/22/24  9:10 PM   Result Value Ref Range    Glucose, POC 152 (H) 70 - 100 mg/dL   CBC AND DIFF    Collection Time: 04/23/24  5:56 AM   Result Value Ref Range    White Blood Cells 4.80 4.50 - 11.00 10*3/uL    Red Blood Cells 3.77 (L) 4.00 - 5.00 10*6/uL    Hemoglobin 9.9 (L) 12.0 - 15.0 g/dL    Hematocrit 70.4 (L) 36.0 - 45.0 %    MCV 78.2 (L) 80.0 - 100.0 fL    MCH 26.3 26.0 - 34.0 pg    MCHC 33.7 32.0 - 36.0 g/dL    RDW 82.7 (H) 88.9 - 15.0 %    Platelet Count 233 150 - 400 10*3/uL    MPV 8.2 7.0 - 11.0 fL    Neutrophils 71.3 41.0 - 77.0 %    Lymphocytes 14.3 (L) 24.0 - 44.0 %    Monocytes 13.0 (H) 4.0 - 12.0 %    Eosinophils 1.1 0.0 - 5.0 %    Basophils 0.3 0.0 - 2.0 %    Absolute Neutrophil Count 3.40 1.80 - 7.00 10*3/uL    Absolute Lymph Count 0.70 (L) 1.00 - 4.80 10*3/uL    Absolute Monocyte Count 0.60 0.00 - 0.80 10*3/uL    Absolute Eosinophil Count 0.10 0.00 - 0.45 10*3/uL    Absolute Basophil Count 0.00 0.00 - 0.20 10*3/uL    MDW (Monocyte Distribution Width) 18.8 <=20.6 COMPREHENSIVE METABOLIC PANEL    Collection Time: 04/23/24  5:56 AM   Result Value Ref Range    Sodium 142 137 - 147 mmol/L    Potassium 3.3 (L) 3.5 - 5.1 mmol/L    Chloride 105 98 - 110 mmol/L    Glucose 144 (H) 70 - 100 mg/dL    Blood Urea Nitrogen 78 (H) 7 - 25 mg/dL    Creatinine 89.56 (H) 0.40 - 1.00 mg/dL    Calcium  9.1 8.5 - 10.6 mg/dL    Total Protein 7.3 6.0 - 8.0 g/dL    Total Bilirubin 0.3 0.2 - 1.3 mg/dL    Albumin 3.4 (L) 3.5 - 5.0 g/dL    Alk Phosphatase 59 25 - 110 U/L    AST 11 7 - 40 U/L    ALT 8 7 - 56 U/L    CO2 17 (L) 21 - 30 mmol/L    Anion Gap 20 (H) 3 - 12    Glomerular Filtration Rate (GFR) 4 (L) >60 mL/min   MAGNESIUM     Collection Time: 04/23/24  5:56 AM   Result Value Ref Range    Magnesium  2.2 1.6 - 2.6 mg/dL   PHOSPHORUS    Collection Time: 04/23/24  5:56 AM  Result Value Ref Range    Phosphorus 5.1 (H) 2.0 - 4.5 mg/dL   PROTIME INR (PT)    Collection Time: 04/23/24  5:56 AM   Result Value Ref Range    Protime 13.9 9.9 - 14.2 Seconds    INR 1.2 0.9 - 1.2   POC GLUCOSE    Collection Time: 04/23/24  6:05 AM   Result Value Ref Range    Glucose, POC 140 (H) 70 - 100 mg/dL              Jacqueline SHAUNNA Barter, APRN-NP  Pager [phone]                     [1]   Facility-Administered Medications Prior to Admission   Medication Dose Route Frequency Provider Last Rate Last Admin    darbepoetin alfa  (ARANESP ) (*) injection 100 mcg  100 mcg Subcutaneous Q7 Days Gupta, Mallika, MD   100 mcg at 04/19/24 1101     Medications Prior to Admission   Medication Sig Dispense Refill Last Dose/Taking    aspirin  EC (ASPIR-LOW) 81 mg tablet Take one tablet by mouth daily. Take with food. 90 tablet 3     blood sugar diagnostic (ONETOUCH VERIO TEST STRIPS) test strip Use one strip as directed before meals and at bedtime. ICD-10: Type II Diabetes with unspecified complications; with long term insulin  use E11.8, Z79.4  Indications: type 2 diabetes mellitus 300 strip 3     CALCIUM  PO Take 1 tablet by mouth daily. CHOLEcalciferoL  (vitamin D3) 50 mcg (2,000 unit) capsule TAKE 1 CAPSULE BY MOUTH EVERY DAY 90 capsule 3     DEXCOM G7 SENSOR sensor device Use one each as directed every 10 days. Indications: type 2 diabetes mellitus 9 each 3     everolimus  (immunosuppressive) (ZORTRESS ) 1 mg tablet Take one tablet by mouth twice daily. Indications: prevent kidney transplant rejection, Z94.0 180 tablet 3     ferrous sulfate  (FEOSOL) 325 mg (65 mg iron ) tablet Take one tablet by mouth at bedtime daily. Take on an empty stomach at least 1 hour before or 2 hours after food. (Patient taking differently: Take one tablet by mouth daily before breakfast. Take on an empty stomach at least 1 hour before or 2 hours after food.) 30 tablet 3     glucose (DEX4 GLUCOSE) 4 gram chewable tablet Chew four tablets by mouth as Needed. Indications: low blood sugar 50 tablet 0     insulin  aspart (U-100) (NOVOLOG  FLEXPEN U-100 INSULIN ) 100 unit/mL (3 mL) PEN Inject 10 units subcutaneously with breakfast, 8 units with lunch, and 10 units with evening meal PLUS sliding scale If blood sugar 151-200: Take extra 2 unit. If blood sugar 201-250: Take extra 4 units. If blood sugar 251-300: Take extra 6 units. If blood sugar is 301-350: Take extra 8 units. If blood sugars > 350: Take extra 10 units. Max daily dose 45 units.  Indications: type 2 diabetes mellitus 45 mL 1     insulin  degludec (TRESIBA FLEXTOUCH U-100) 100 unit/mL (3 mL) subcutaneous PEN Inject sixty Units under the skin at bedtime daily.       insulin  glargine (LANTUS  SOLOSTAR U-100 INSULIN ) 100 unit/mL (3 mL) subcutaneous PEN Inject fifty Units under the skin at bedtime daily. Indications: type 2 diabetes mellitus (Patient not taking: Reported on 04/03/2024) 60 mL 3     lancets 33 gauge (ONETOUCH DELICA 33 GUAGE) 33 gauge Use one each as directed four times daily. ICD-10: Type II  Diabetes with unspecified complications; with long term insulin  use E11.8, Z79.4 300 each 3     oxyBUTYnin  chloride (DITROPAN ) 5 mg tablet Take one tablet by mouth twice daily as needed. Indications: a condition where the urge to urinate results in urine leakage 60 tablet 0     pen needle, diabetic (BD ULTRA-FINE NANO PEN NEEDLE) 32 gauge x 5/32 pen needle Use one pen needle as directed as Needed. Use with insulin  injections. 300 each 3     rosuvastatin  (CRESTOR ) 10 mg tablet Take one tablet by mouth daily. Indications: excessive fat in the blood 60 tablet 0     tacrolimus  (PROGRAF ) 1 mg capsule Take one capsule by mouth twice daily. Indications: prevent kidney transplant rejection 180 capsule 3     tirzepatide  (MOUNJARO ) 7.5 mg/0.5 mL injector PEN Inject 0.5 mL under the skin every 7 days. Indications: type 2 diabetes mellitus 6 mL 3    [2] No Known Allergies

## 2024-04-23 NOTE — Progress Notes
 CLINICAL NUTRITION                                                        Clinical Nutrition Initial Assessment    Name: Jacqueline Shields   MRN: [id number]     DOB: 05-13-1966      Age: 58 y.o.  Admission Date: 04/21/2024     LOS: 2 days     Date of Service: 04/23/2024    Recommendation:  Liberalize diet to Low Sodium and monitor intakes.   Encourage adequate hydration, protein consumption w/ each meal & consumption of 3 meals per day/avoiding skipping meals.   Nursing: Please offer [employer] Nutrition Supplements during times of low appetite/patient refusing a meal & please remind patient to order meals and/or order meals for patient.    Comments:  Jacqueline Shields is a 58 y.o. 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    Nephrology transplant consulted, anticipate possible dialysis, now s/p kidney biopsy 04/22/2024. Dietitian flagged for potentially at risk, MST score >3 reporting poor intakes and weight loss without trying. Per EMR review, weight was ~ 216# 11/01/2023, 212# 01/2024, 206# 8/18, down to ~ 185# this admission (- 10% in 1 month, severe for time period). Spoke w/ patient today at bedside. Reported PTA patient consuming <1 meal/day since starting new Mounjaro  medication 02/09/2024. States severe decrease of appetite and PO intake since starting to take medication. Confirmed weight loss hx above. RD would define this as unintentional weight since pt reported difficulty consuming food rather than wishing to avoid intakes to lose weight. Acutely, little to no PO intake since Thursday. Encouraged PO intake efforts. Discussed need for nutritional supplements PRN if patient not able to consume a meal. Encouraged moderate protein intake while not on HD, may need to consider increased kcal/protein if HD started. Pt understands though will likely need education. Clinical nutrition will continue to monitor patient and provide recommendations PRN.     Nutrition Assessment of Patient:  Admit Weight: 84.3 kg;  ; Desired Weight: 74.2 kg  BMI (Calculated): 28.25;      Pertinent Allergies/Intolerances: None per patient report     Oral Diet Order: Renal- non dialysis patient;                   Weight Used for Calculation: 74.2 kg  Estimated Calorie Needs: 1850-2225 (25-30 kcal/kg DBW)  Estimated Protein Needs: 59-74 (0.8-1.0 g/kg DBW)    Malnutrition Assessment:   Malnutrition Details/Malnutrition present  ICD-10 code E43: Acute illness/Severe malnutrition    Weight loss: Greater than 5% x 1 month, Energy intake: 50% or less of estimated energy requirement for 5 days or more          Malnutrition Interventions: Assessed nutritional status & provided recommendations    Physical Assessment:  RLE Edema: Non-pitting  LLE Edema: Non-pitting     Pressure Injury: none     Comment: +BM PTA    Nutrition Diagnosis:  Inadequate oral intake  Etiology: poor appetite  Signs & Symptoms: pt reports, intake recall                      Intervention /  Plan:     Monitor wt, labs, meds, GI symptoms, PO tolerance/provision  Assessed nutritional status & provided recommendations       Julien Kiel, MS, RD, LD, CNSC   Available on Voalte  Office: [phone]

## 2024-04-24 ENCOUNTER — Encounter: Admit: 2024-04-24 | Discharge: 2024-04-24 | Payer: PRIVATE HEALTH INSURANCE

## 2024-04-24 LAB — CBC AND DIFF
~~LOC~~ BKR ABSOLUTE EOS COUNT: 0.2 10*3/uL (ref 0.00–0.45)
~~LOC~~ BKR ABSOLUTE MONO COUNT: 0.8 10*3/uL — ABNORMAL LOW (ref 0.00–0.80)
~~LOC~~ BKR MCH: 26 pg — ABNORMAL HIGH (ref 26.0–34.0)
~~LOC~~ BKR MCHC: 34 g/dL — ABNORMAL HIGH (ref 32.0–36.0)
~~LOC~~ BKR MCV: 77 fL — ABNORMAL LOW (ref 80.0–100.0)
~~LOC~~ BKR PLATELET COUNT: 244 10*3/uL (ref 150–400)
~~LOC~~ BKR RDW: 17 % — ABNORMAL HIGH (ref 11.0–15.0)

## 2024-04-24 LAB — POC GLUCOSE
~~LOC~~ BKR POC GLUCOSE: 235 mg/dL — ABNORMAL HIGH (ref 70–100)
~~LOC~~ BKR POC GLUCOSE: 134 mg/dL — ABNORMAL HIGH (ref 70–100)
~~LOC~~ BKR POC GLUCOSE: 158 mg/dL — ABNORMAL HIGH (ref 70–100)
~~LOC~~ BKR POC GLUCOSE: 214 mg/dL — ABNORMAL HIGH (ref 70–100)
~~LOC~~ BKR POC GLUCOSE: 219 mg/dL — ABNORMAL HIGH (ref 70–100)
~~LOC~~ BKR POC GLUCOSE: 253 mg/dL — ABNORMAL HIGH (ref 70–100)
~~LOC~~ BKR POC GLUCOSE: 279 mg/dL — ABNORMAL HIGH (ref 70–100)

## 2024-04-24 LAB — CBC
~~LOC~~ BKR MPV: 8.1 fL (ref 7.0–11.0)
~~LOC~~ BKR PLATELET COUNT: 245 10*3/uL (ref 150–400)

## 2024-04-24 LAB — COMPREHENSIVE METABOLIC PANEL
~~LOC~~ BKR ALBUMIN: 3.5 g/dL — ABNORMAL LOW (ref 3.5–5.0)
~~LOC~~ BKR ALK PHOSPHATASE: 63 U/L — ABNORMAL HIGH (ref 25–110)
~~LOC~~ BKR ALT: 14 U/L — ABNORMAL LOW (ref 70–180)
~~LOC~~ BKR ANION GAP: 14 10*3/uL — ABNORMAL HIGH (ref 3–12)
~~LOC~~ BKR AST: 28 U/L — ABNORMAL LOW (ref 7–40)
~~LOC~~ BKR CO2: 21 mmol/L — ABNORMAL LOW (ref 21–30)
~~LOC~~ BKR TOTAL BILIRUBIN: 0.3 mg/dL (ref 0.2–1.3)
~~LOC~~ BKR TOTAL PROTEIN: 7.2 g/dL (ref 6.0–8.0)

## 2024-04-24 MED ORDER — INSULIN GLARGINE 100 UNIT/ML (3 ML) SC INJ PEN
50 [IU] | Freq: Every evening | SUBCUTANEOUS | 0 refills | Status: DC
Start: 2024-04-24 — End: 2024-04-30
  Administered 2024-04-26: 03:00:00 50 [IU] via SUBCUTANEOUS

## 2024-04-24 MED ORDER — INSULIN ASPART 100 UNIT/ML SC FLEXPEN
12 [IU] | Freq: Every day | SUBCUTANEOUS | 0 refills | Status: DC
Start: 2024-04-24 — End: 2024-04-30

## 2024-04-24 MED ORDER — POTASSIUM CHLORIDE 20 MEQ PO TBTQ
20 meq | Freq: Once | ORAL | 0 refills | Status: CP
Start: 2024-04-24 — End: ?
  Administered 2024-04-24: 15:00:00 20 meq via ORAL

## 2024-04-24 MED ORDER — TACROLIMUS 0.5 MG PO CAP
.5 mg | Freq: Two times a day (BID) | ORAL | 0 refills | Status: DC
Start: 2024-04-24 — End: 2024-04-30
  Administered 2024-04-24 – 2024-04-30 (×11): 0.5 mg via ORAL

## 2024-04-24 MED ADMIN — WATER FOR INJECTION, STERILE IJ SOLN [79513]: 20 mL | INTRAVENOUS | @ 05:00:00 | Stop: 2024-04-24 | NDC 00409488717

## 2024-04-24 NOTE — Progress Notes
 Float Pool END OF SHIFT/ JHFRAT NOTE    Admission Date: 04/21/2024    Acute events, interventions, provider communication:     No acute events. Pt urine has no blood/ blood clots, and is yellow in color and clear. No c/o nausea.    Patient Interventions and Education  Fall Risk/JHFRAT Interventions and Education: (Charting when applicable)  Elimination Interventions : Use of bladder management device (e.g., female/female external urinary containment device)   Medications : Educate patient on medication side effects  Patient Care Equipment: Needs assistance with patient care equipment when ambulating and Ensure environment is free of clutter and walkways are clear from tripping hazards  Mobility: Assist x1 and Gait belt in use when ambulating  Cognition: N/A  Risk for Moderate/Major Injury: Surgery/procedure requiring anesthesia within past 24 hours    2. Restraints:  No     Restraint Goal: Patient will be free from injury while physically restrained.  See Docflowsheet for restraint documentation, interventions, education, etc.    Intake and Output:       Intake/Output Summary (Last 24 hours) at 04/24/2024 0551  Last data filed at 04/24/2024 0450  Gross per 24 hour   Intake 1434 ml   Output 1875 ml   Net -441 ml              Last Bowel Movement Date:  (PTA)

## 2024-04-24 NOTE — Progress Notes
 General Progress Note    Name: Jacqueline Shields        MRN: [id number]          DOB: 1966-01-29            Age: 58 y.o.  Admission Date: 04/21/2024       LOS: 3 days    Date of Service: 04/24/2024    Assessment/Plan:      Jacqueline Shields is a 58 y.o. 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 Intra-renal etiology  vs rejection  PLAN:  > Start LR at 125 mL/hour as above   > Nephrology transplant consulted, anticipate possible dialysis +/- kidney biopsy 04/22/2024   > Start PTA Everolimus  1 mg BID  > Start PTA Tacrolimus  1 mg BID   > Ultrasound of transplanted kidney ordered   > Daily tacrolimus  levels   > IR renal biopsy on 9/23, peding results    Urinary tract infection   Nausea/ Vomiting   Nonobstructive nephrolithiasis   Severe hydronephrosis of R transplant kidney, chronic   Hydroureter of R transplant kidney , chronic    - Recent renal ultrasound 03/15/2024 showed mild hydroureteronephrosis, decreased since September 2024, progressive decrease in size and cortical thickness of transplant kidney.   CT abdomen/ pelvis: 1.8 cm cavitary lesion in right middle lobe with slightly improved aeration. Interval development of mild left sided hydronephrosis. Tiny nonobstructive calculi within lower pole calices of right kidney. Redemonstration of right pelvic renal transplant with severe hydronephrosis with associated hydroureter. Moderately distended urinary bladder with wall thickening.  - UA: 2+ turbidity, 3+ glucose, 1+ ketones, 3+ protein, 2+ blood, 3+ leukocytes, packed WBC, packed RBC, packed bacteria  PLAN:  > Start IV Ceftriaxone  2 g daily, monitor for toxicities  > s/p LR at 125 mL/ hour      Coronary artery disease  Dyslipidemia  - LHC 2021: 40% to 50% percent mid right coronary artery disease.Small branch vessel 80% disease with involving the superior branch of medium caliber diagonal vessel.  > Continue Rosuvastatin  10 mg daily   > HOLD PTA ASA 81 mg daily for renal biopsy     Chronic anemia of renal disease, stable  - Hemoglobin 10.2, MCV 76.2 on admission  - Baseline 8-8.7      Insulin  Dependent Type II Diabetes  - A1C: 6.3  - PTA: Triseba 50 U QHS, Aspart 10 U with breakfast, 8 U with lunch, 10 U with dinner   - PTA: Mounjaro  7.5 q 7 days   PLAN:  > Restart PTA Glargine 50 U QHS, Aspart 12U with breakfast, 12U with lunch, 12U with dinner  > Hold PTA Mounjaro       Diet: Renal  VTE ppx: Heparin  on hold for renal biopsy  CODE: Full   DISPO: Admit to medicine.      [facility] Medicine  Voalte is the preferred method of communication for Internal Medicine. Please search voalte directory for Med Private First Call assigned team name to find the correct covering doctor or nurse practitioner 24/7. You can find the assigned team in the information bar to the left in the patient chart under service.  Personal Voaltes and pagers are not answered at all hours.          Assessment & Plan  Metabolic acidosis    Diabetes mellitus (CMS-HCC)    AKI (acute kidney injury)    Urinary tract infection    High anion gap metabolic acidosis    Severe malnutrition       Present on Admission:   Metabolic acidosis   AKI (acute kidney injury)   Diabetes mellitus (CMS-HCC)   Severe malnutrition     _______________________________________________________________________    Subjective     She is actually feeling a little bit better today.  She says she feels like she has a little more energy.  She is making urine.  He is her creatinine has down trended a little bit. IR performed renal biopsy yesterday her hemoglobin is otherwise stable.  She is tolerating p.o.  Her glucose has been elevated we will increase her Premeal to 12 units 3 times a day.    Awaiting further recs from renal transplant including either dialysis or await further renal recovery.            Malnutrition Details:  Malnutrition present  ICD-10 code E43: Acute illness/Severe malnutrition    Weight loss: Greater than 5% x 1 month, Energy intake: 50% or less of estimated energy requirement for 5 days or more                                     Malnutrition Interventions: Assessed nutritional status & provided recommendations     Active Wounds                            Wounds Puncture Right;Lower Abdomen (Active)   04/23/24 0920   Wound Type: Puncture   Orientation: Right;Lower   Location: Abdomen   Wound Location Comments:    Initial Wound Site Closure:    Initial Dressing Placed: Transparent (i.e. Tegaderm);Gauze   Initial Cycle:    Initial Suction Setting (mmHg):    Pressure Injury Stages:    Pressure Injury Present Within 24 Hours of Hospital Admission:    If This Pressure Injury Is Suspected to Be Device Related, Please Select the Device::    Is the Wound Open or Closed:    Wound Assessment Dressing not removed for assessment 04/23/24 2138   Peri-wound Assessment Dry;Intact 04/23/24 2138   Wound Drainage Amount None 04/23/24 2138   Wound Dressing Status Intact 04/23/24 2138   Number of days: 1                    Medications  Scheduled Meds:[Held by Provider] aspirin  EC (ASPIR-LOW) tablet 81 mg, 81 mg, Oral, QDAY  cefTRIAXone  (ROCEPHIN ) IVP 2 g, 2 g, Intravenous, Q24H*  everolimus  (immunosuppressive) (ZORTRESS ) tablet 1 mg, 1 mg, Oral, BID(6-18)  [Held by Provider] heparin  (porcine) PF syringe 5,000 Units, 5,000 Units, Subcutaneous, Q8H  influenza (=>65 Yo) trivalent HIGH DOSE (FLUZONE) 180 mcg/0.5 mL 2025-26 PF syringe 0.5 mL, 0.5 mL, Intramuscular, ONCE  insulin  aspart (U-100) (NOVOLOG  FLEXPEN U-100 INSULIN ) injection  PEN 0-6 Units, 0-6 Units, Subcutaneous, ACHS (22)  insulin  aspart (U-100) (NOVOLOG  FLEXPEN U-100 INSULIN ) injection PEN 10 Units, 10 Units, Subcutaneous, QDAY w/breakfast  insulin  aspart (U-100) (NOVOLOG  FLEXPEN U-100 INSULIN ) injection PEN 10 Units, 10 Units, Subcutaneous, QDAY w/dinner  insulin  aspart (U-100) (NOVOLOG  FLEXPEN U-100 INSULIN ) injection PEN 8 Units, 8 Units, Subcutaneous, QDAY w/lunch  insulin  glargine (LANTUS  SOLOSTAR U-100 INSULIN ) injection PEN 30 Units, 30 Units, Subcutaneous, QHS(22)  rosuvastatin  (CRESTOR ) tablet 10 mg, 10 mg, Oral, QDAY  sodium bicarbonate  tablet 1,300 mg, 1,300 mg, Oral, BID  tacrolimus  (PROGRAF ) capsule 1 mg, 1 mg, Oral, BID    Continuous Infusions:      PRN and Respiratory Meds:acetaminophen  Q4H PRN, calcium  carbonate Q4H PRN, dextrose  50% PRN, melatonin QHS PRN, ondansetron  Q6H PRN **OR** ondansetron  Q6H PRN, oxyBUTYnin  chloride BID PRN, polyethylene glycol 3350  QDAY PRN, sennosides-docusate sodium  QDAY PRN                Objective                        Vital Signs: Last Filed                 Vital Signs: 24 Hour Range   BP: 103/69 (09/24 0700)  Temp: 36.6 ?C (97.9 ?F) (09/24 0700)  Pulse: 79 (09/23 2345)  Respirations: 15 PER MINUTE (09/23 2345)  SpO2: 100 % (09/23 2345)  O2 Device: None (Room air) (09/24 0700) BP: (100-157)/(62-97)   Temp:  [36.3 ?C (97.3 ?F)-36.6 ?C (97.9 ?F)]   Pulse:  [75-87]   Respirations:  [8 PER MINUTE-24 PER MINUTE]   SpO2:  [98 %-100 %]   O2 Device: None (Room air)     Vitals:    04/21/24 1846   Weight: 84.3 kg (185 lb 12.8 oz)       Intake/Output Summary:  (Last 24 hours)    Intake/Output Summary (Last 24 hours) at 04/24/2024 0835  Last data filed at 04/24/2024 0450  Gross per 24 hour   Intake 1434 ml   Output 1325 ml   Net 109 ml     Stool Occurrence: 0        Physical Exam  GEN: Awake, alert, NAD, appears well today  CV: S1S2 RRR, no murmurs  RESP: No respiratory distress, clear to auscultation, no wheezes or rhonchi  ABD: Soft, nontender, non distended, good bowel sounds  EXT: No LE edema  GU: Blood tinged yellow urine in foley    Lab Review  24-hour labs:    Results for orders placed or performed during the hospital encounter of 04/21/24 (from the past 24 hours)   CBC    Collection Time: 04/23/24  2:16 PM   Result Value Ref Range    White Blood Cells 5.30 4.50 - 11.00 10*3/uL    Red Blood Cells 2.73 (L) 4.00 - 5.00 10*6/uL    Hemoglobin 7.3 (L) 12.0 - 15.0 g/dL    Hematocrit 78.5 (L) 36.0 - 45.0 %    MCV 78.5 (L) 80.0 - 100.0 fL    MCH 26.6 26.0 - 34.0 pg    MCHC 33.9 32.0 - 36.0 g/dL    RDW 82.5 (H) 88.9 - 15.0 %    Platelet Count 290 150 - 400 10*3/uL    MPV 8.2 7.0 - 11.0 fL   POC GLUCOSE    Collection Time: 04/23/24  9:28 PM   Result Value Ref Range    Glucose, POC 235 (H)  70 - 100 mg/dL   CBC    Collection Time: 04/23/24  9:53 PM   Result Value Ref Range    White Blood Cells 4.40 (L) 4.50 - 11.00 10*3/uL    Red Blood Cells 3.91 (L) 4.00 - 5.00 10*6/uL    Hemoglobin 10.3 (L) 12.0 - 15.0 g/dL    Hematocrit 69.5 (L) 36.0 - 45.0 %    MCV 77.8 (L) 80.0 - 100.0 fL    MCH 26.3 26.0 - 34.0 pg    MCHC 33.8 32.0 - 36.0 g/dL    RDW 82.5 (H) 88.9 - 15.0 %    Platelet Count 245 150 - 400 10*3/uL    MPV 8.1 7.0 - 11.0 fL   TYPE & SCREEN (NOT CROSSMATCH ELIGIBLE)    Collection Time: 04/23/24  9:53 PM   Result Value Ref Range    ABO/RH(D) A POS     Antibody Screen NEG     Record Check FOUND    CBC AND DIFF    Collection Time: 04/24/24  4:56 AM   Result Value Ref Range    White Blood Cells 4.90 4.50 - 11.00 10*3/uL    Red Blood Cells 3.88 (L) 4.00 - 5.00 10*6/uL    Hemoglobin 10.3 (L) 12.0 - 15.0 g/dL    Hematocrit 69.9 (L) 36.0 - 45.0 %    MCV 77.3 (L) 80.0 - 100.0 fL    MCH 26.4 26.0 - 34.0 pg    MCHC 34.2 32.0 - 36.0 g/dL    RDW 82.7 (H) 88.9 - 15.0 %    Platelet Count 244 150 - 400 10*3/uL    MPV 8.2 7.0 - 11.0 fL    Neutrophils 62.9 41.0 - 77.0 % Lymphocytes 17.2 (L) 24.0 - 44.0 %    Monocytes 15.6 (H) 4.0 - 12.0 %    Eosinophils 3.6 0.0 - 5.0 %    Basophils 0.7 0.0 - 2.0 %    Absolute Neutrophil Count 3.10 1.80 - 7.00 10*3/uL    Absolute Lymph Count 0.80 (L) 1.00 - 4.80 10*3/uL    Absolute Monocyte Count 0.80 0.00 - 0.80 10*3/uL    Absolute Eosinophil Count 0.20 0.00 - 0.45 10*3/uL    Absolute Basophil Count 0.00 0.00 - 0.20 10*3/uL    MDW (Monocyte Distribution Width) 17.3 <=20.6   COMPREHENSIVE METABOLIC PANEL    Collection Time: 04/24/24  4:56 AM   Result Value Ref Range    Sodium 139 137 - 147 mmol/L    Potassium 3.1 (L) 3.5 - 5.1 mmol/L    Chloride 104 98 - 110 mmol/L    Glucose 303 (H) 70 - 100 mg/dL    Blood Urea Nitrogen 84 (H) 7 - 25 mg/dL    Creatinine 0.08 (H) 0.40 - 1.00 mg/dL    Calcium  8.7 8.5 - 10.6 mg/dL    Total Protein 7.2 6.0 - 8.0 g/dL    Total Bilirubin 0.3 0.2 - 1.3 mg/dL    Albumin 3.5 3.5 - 5.0 g/dL    Alk Phosphatase 63 25 - 110 U/L    AST 28 7 - 40 U/L    ALT 14 7 - 56 U/L    CO2 21 21 - 30 mmol/L    Anion Gap 14 (H) 3 - 12    Glomerular Filtration Rate (GFR) 4 (L) >60 mL/min   MAGNESIUM     Collection Time: 04/24/24  4:56 AM   Result Value Ref Range    Magnesium  2.2 1.6 - 2.6 mg/dL  PHOSPHORUS    Collection Time: 04/24/24  4:56 AM   Result Value Ref Range    Phosphorus 4.7 (H) 2.0 - 4.5 mg/dL   TACROLIMUS     Collection Time: 04/24/24  4:56 AM   Result Value Ref Range    Tacrolimus  Immunoassay 9.3 5.0 - 15.0 ng/mL   POC GLUCOSE    Collection Time: 04/24/24  6:08 AM   Result Value Ref Range    Glucose, POC 279 (H) 70 - 100 mg/dL       Point of Care Testing  (Last 24 hours)  Glucose: (!) 303 (04/24/24 0456)  POC Glucose (Download): (!) 279 (04/24/24 9391)    Radiology and other Diagnostics Review:    Last Images past 24 hours:   Radiology  (Last 24 hours)                 04/23/24 0859  IR RENAL TRANSPLANT BIOPSY Final result    Impression:      1.  Successful ultrasound guided biopsy of right lower quadrant transplant kidney biopsy. I, Juliene Endow, M.D., the attending radiologist, was present for the procedure, personally reviewed the images, and formulated the interpretations and opinions expressed in this report.        @TT             Finalized by ADAM ALLI on 04/24/2024 8:13 AM. Dictated by ADAM ALLI on 04/24/2024 8:13 AM.

## 2024-04-25 ENCOUNTER — Inpatient Hospital Stay: Admit: 2024-04-25 | Discharge: 2024-04-25 | Payer: PRIVATE HEALTH INSURANCE

## 2024-04-25 LAB — SURGICAL PATHOLOGY

## 2024-04-25 LAB — POC GLUCOSE
~~LOC~~ BKR POC GLUCOSE: 130 mg/dL — ABNORMAL HIGH (ref 70–100)
~~LOC~~ BKR POC GLUCOSE: 143 mg/dL — ABNORMAL HIGH (ref 70–100)

## 2024-04-25 MED ORDER — SODIUM CITRATE 4 % (3 ML) IK SYRG
0 refills | Status: CP
Start: 2024-04-25 — End: ?

## 2024-04-25 MED ORDER — FENTANYL CITRATE (PF) 50 MCG/ML IJ SOLN
0 refills | Status: CP
Start: 2024-04-25 — End: ?

## 2024-04-25 MED ORDER — MIDAZOLAM 1 MG/ML IJ SOLN
0 refills | Status: CP
Start: 2024-04-25 — End: ?

## 2024-04-25 MED ORDER — POTASSIUM CHLORIDE 20 MEQ PO TBTQ
20 meq | Freq: Once | ORAL | 0 refills | Status: CP
Start: 2024-04-25 — End: ?
  Administered 2024-04-25: 23:00:00 20 meq via ORAL

## 2024-04-25 MED ORDER — MIDAZOLAM 1 MG/ML IJ SOLN
1 mg | Freq: Once | INTRAVENOUS | 0 refills | Status: CP
Start: 2024-04-25 — End: ?
  Administered 2024-04-25: 20:00:00 1 mg via INTRAVENOUS

## 2024-04-25 NOTE — Nursing Note
 Sedation physician present in room. Recent vitals and patient condition reviewed between sedating physician and nurse. Reassessment completed. Determination made to proceed with planned sedation.

## 2024-04-25 NOTE — Unmapped
 Immediate Post Procedure Note    Date:  04/25/2024                                           Attending Physician:   DELENA Cave  Assistant(s):  Rosina    Procedure(s):  Right chest tunneled HD cath  Indications:  HD  Findings:  above  Anesthesia: Local 10 mL 1% lidocaine  without epinephrine  with IV sedation .  Sedation/Medication Plan: Fentanyl  and Midazolam     Time out performed: Consent obtained, correct patient verified, correct procedure verified, correct site verified, patient marked as necessary.  Estimated Blood Loss:  None/Negligible  Specimen(s) Removed/Disposition:  None  Complications: None  Comments:    Dale JONELLE Cave, MD

## 2024-04-25 NOTE — Discharge Instructions
 Please don't hesitate to contact a registered dietitian if needed:   Contact information:  Konrad Saha, MS, RD, LD, CNSC  Phone: [phone]  [email or web address]

## 2024-04-25 NOTE — Progress Notes
 General Progress Note    Name: Jacqueline Shields        MRN: [id number]          DOB: 1966/02/07            Age: 58 y.o.  Admission Date: 04/21/2024       LOS: 4 days    Date of Service: 04/25/2024    Assessment/Plan:      Jacqueline Shields is a 58 y.o. 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 IR renal biopsy performed on 9/23. Renal transplant team recommending initiation of HD. IR HD line ordered on 9/25.      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 Intra-renal etiology  vs rejection  PLAN:  > Start LR at 125 mL/hour as above   > Nephrology transplant consulted, anticipate possible dialysis +/- kidney biopsy 04/22/2024   > Start PTA Everolimus  1 mg BID  > Start PTA Tacrolimus  1 mg BID   > Ultrasound of transplanted kidney ordered   > Daily tacrolimus  levels   > IR renal biopsy on 9/23, pending results  > Renal transplant recommending HD intiatiatio  > IR HD line ordered on 9/25    Urinary tract infection   Nausea/ Vomiting, resolved   Nonobstructive nephrolithiasis   Severe hydronephrosis of R transplant kidney, chronic   Hydroureter of R transplant kidney , chronic    - Recent renal ultrasound 03/15/2024 showed mild hydroureteronephrosis, decreased since September 2024, progressive decrease in size and cortical thickness of transplant kidney.   CT abdomen/ pelvis: 1.8 cm cavitary lesion in right middle lobe with slightly improved aeration. Interval development of mild left sided hydronephrosis. Tiny nonobstructive calculi within lower pole calices of right kidney. Redemonstration of right pelvic renal transplant with severe hydronephrosis with associated hydroureter. Moderately distended urinary bladder with wall thickening.  - UA: 2+ turbidity, 3+ glucose, 1+ ketones, 3+ protein, 2+ blood, 3+ leukocytes, packed WBC, packed RBC, packed bacteria  > Start IV Ceftriaxone  2 g daily, monitor for toxicities  > s/p LR at 125 mL/ hour      Coronary artery disease  Dyslipidemia  - LHC 2021: 40% to 50% percent mid right coronary artery disease.Small branch vessel 80% disease with involving the superior branch of medium caliber diagonal vessel.  > Continue Rosuvastatin  10 mg daily   > HOLD PTA ASA 81 mg daily for renal biopsy     Chronic anemia of renal disease, stable  - Hemoglobin 10.2, MCV 76.2 on admission  - Baseline 8-8.7      Insulin  Dependent Type II Diabetes  - A1C: 6.3  - PTA: Triseba 50 U QHS, Aspart 10 U with breakfast, 8 U with lunch, 10 U with dinner   - PTA: Mounjaro  7.5 q 7 days   > Restart PTA Glargine 50 U QHS, Aspart 12U with breakfast, 12U with lunch, 12U with dinner  > Hold PTA Mounjaro   Diet: Renal  VTE ppx: Heparin  on hold for renal biopsy  CODE: Full   DISPO: Admit to medicine.      [facility] Medicine    Voalte is the preferred method of communication for Internal Medicine. Please search voalte directory for Med Private First Call assigned team name to find the correct covering doctor or nurse practitioner 24/7. You can find the assigned team in the information bar to the left in the patient chart under service.  Personal Voaltes and pagers are not answered at all hours.      High medical decision making due to the following:  1 or more chronic illness with severe exacerbation  drug therapy requiring intensive monitoring for toxicityIV CTX      Assessment & Plan  Metabolic acidosis    Diabetes mellitus (CMS-HCC)    AKI (acute kidney injury)    Urinary tract infection    High anion gap metabolic acidosis    Severe malnutrition       Present on Admission:   Metabolic acidosis   AKI (acute kidney injury)   Diabetes mellitus (CMS-HCC)   Severe malnutrition     _______________________________________________________________________    Subjective       She is overall feeling better no nausea and vomiting.  She does get a little bit lightheaded when she gets up to move about.  She is overall disappointed at the need for reinitiation of dialysis.  IR has been consulted for dialysis line to be placed today.  Her blood sugar has been better controlled.        Malnutrition Details:  Malnutrition present  ICD-10 code E43: Acute illness/Severe malnutrition    Weight loss: Greater than 5% x 1 month, Energy intake: 50% or less of estimated energy requirement for 5 days or more                                     Malnutrition Interventions: Assessed nutritional status & provided recommendations     Active Wounds                            Wounds Puncture Right;Lower Abdomen (Active)   04/23/24 0920   Wound Type: Puncture   Orientation: Right;Lower   Location: Abdomen   Wound Location Comments:    Initial Wound Site Closure:    Initial Dressing Placed: Transparent (i.e. Tegaderm);Gauze   Initial Cycle:    Initial Suction Setting (mmHg):    Pressure Injury Stages:    Pressure Injury Present Within 24 Hours of Hospital Admission:    If This Pressure Injury Is Suspected to Be Device Related, Please Select the Device::    Is the Wound Open or Closed:    Wound Assessment Dressing not removed for assessment 04/25/24 0400   Peri-wound Assessment Dry;Intact 04/25/24 0400   Wound Drainage Amount None 04/25/24 0400   Wound Dressing Status Intact 04/25/24 0400   Number of days: 2                    Medications  Scheduled Meds:cefTRIAXone  (ROCEPHIN ) IVP 2 g, 2 g, Intravenous, Q24H*  everolimus  (immunosuppressive) (ZORTRESS ) tablet 1 mg, 1 mg, Oral, BID(6-18)  [Held by Provider] heparin  (porcine) PF syringe 5,000 Units, 5,000 Units, Subcutaneous, Q8H  influenza (=>65 Yo) trivalent HIGH DOSE (FLUZONE) 180 mcg/0.5 mL 2025-26 PF syringe 0.5  mL, 0.5 mL, Intramuscular, ONCE  insulin  aspart (U-100) (NOVOLOG  FLEXPEN U-100 INSULIN ) injection PEN 0-6 Units, 0-6 Units, Subcutaneous, ACHS (22)  insulin  aspart (U-100) (NOVOLOG  FLEXPEN U-100 INSULIN ) injection PEN 12 Units, 12 Units, Subcutaneous, QDAY w/breakfast  insulin  aspart (U-100) (NOVOLOG  FLEXPEN U-100 INSULIN ) injection PEN 12 Units, 12 Units, Subcutaneous, QDAY w/dinner  insulin  aspart (U-100) (NOVOLOG  FLEXPEN U-100 INSULIN ) injection PEN 12 Units, 12 Units, Subcutaneous, QDAY w/lunch  insulin  glargine (LANTUS  SOLOSTAR U-100 INSULIN ) injection PEN 50 Units, 50 Units, Subcutaneous, QHS(22)  rosuvastatin  (CRESTOR ) tablet 10 mg, 10 mg, Oral, QDAY  sodium bicarbonate  tablet 1,300 mg, 1,300 mg, Oral, BID  tacrolimus  (PROGRAF ) capsule 0.5 mg, 0.5 mg, Oral, BID    Continuous Infusions:      PRN and Respiratory Meds:acetaminophen  Q4H PRN, calcium  carbonate Q4H PRN, dextrose  50% PRN, melatonin QHS PRN, ondansetron  Q6H PRN **OR** ondansetron  Q6H PRN, oxyBUTYnin  chloride BID PRN, polyethylene glycol 3350  QDAY PRN, sennosides-docusate sodium  QDAY PRN                Objective                        Vital Signs: Last Filed                 Vital Signs: 24 Hour Range   BP: 101/70 (09/25 0000)  Temp: 37.1 ?C (98.8 ?F) (09/25 0400)  Pulse: 76 (09/24 2000)  Respirations: 21 PER MINUTE (09/25 0400)  SpO2: 96 % (09/25 0400)  O2 Device: None (Room air) (09/25 0400) BP: (98-109)/(67-94)   Temp:  [36.4 ?C (97.5 ?F)-37.1 ?C (98.8 ?F)]   Pulse:  [74-86]   Respirations:  [16 PER MINUTE-21 PER MINUTE]   SpO2:  [96 %-99 %]   O2 Device: None (Room air)     Vitals:    04/21/24 1846   Weight: 84.3 kg (185 lb 12.8 oz)       Intake/Output Summary:  (Last 24 hours)    Intake/Output Summary (Last 24 hours) at 04/25/2024 0840  Last data filed at 04/25/2024 0400  Gross per 24 hour   Intake 240 ml   Output 1750 ml   Net -1510 ml Stool Occurrence: 1 (reported per patient)        Physical Exam  GEN: Awake, alert, NAD,   CV: S1S2 RRR, no murmurs  RESP: No respiratory distress, clear to auscultation, no wheezes or rhonchi  ABD: Soft, nontender, non distended, good bowel sounds  EXT: No LE edema  PSYCH: Dysthymic    Lab Review  24-hour labs:    Results for orders placed or performed during the hospital encounter of 04/21/24 (from the past 24 hours)   POC GLUCOSE    Collection Time: 04/24/24  9:50 AM   Result Value Ref Range    Glucose, POC 253 (H) 70 - 100 mg/dL   POC GLUCOSE    Collection Time: 04/24/24  2:04 PM   Result Value Ref Range    Glucose, POC 214 (H) 70 - 100 mg/dL   POC GLUCOSE    Collection Time: 04/24/24  6:11 PM   Result Value Ref Range    Glucose, POC 219 (H) 70 - 100 mg/dL   POC GLUCOSE    Collection Time: 04/24/24  9:47 PM   Result Value Ref Range    Glucose, POC 130 (H) 70 - 100 mg/dL   CBC AND DIFF    Collection Time: 04/25/24  3:58 AM   Result Value  Ref Range    White Blood Cells 5.30 4.50 - 11.00 10*3/uL    Red Blood Cells 3.83 (L) 4.00 - 5.00 10*6/uL    Hemoglobin 9.7 (L) 12.0 - 15.0 g/dL    Hematocrit 70.1 (L) 36.0 - 45.0 %    MCV 77.7 (L) 80.0 - 100.0 fL    MCH 25.3 (L) 26.0 - 34.0 pg    MCHC 32.6 32.0 - 36.0 g/dL    RDW 82.9 (H) 88.9 - 15.0 %    Platelet Count 238 150 - 400 10*3/uL    MPV 8.0 7.0 - 11.0 fL    Neutrophils 60.3 41.0 - 77.0 %    Lymphocytes 18.2 (L) 24.0 - 44.0 %    Monocytes 15.5 (H) 4.0 - 12.0 %    Eosinophils 5.1 (H) 0.0 - 5.0 %    Basophils 0.9 0.0 - 2.0 %    Absolute Neutrophil Count 3.20 1.80 - 7.00 10*3/uL    Absolute Lymph Count 1.00 1.00 - 4.80 10*3/uL    Absolute Monocyte Count 0.80 0.00 - 0.80 10*3/uL    Absolute Eosinophil Count 0.30 0.00 - 0.45 10*3/uL    Absolute Basophil Count 0.00 0.00 - 0.20 10*3/uL    MDW (Monocyte Distribution Width) 18.3 <=20.6   COMPREHENSIVE METABOLIC PANEL    Collection Time: 04/25/24  3:58 AM   Result Value Ref Range    Sodium 141 137 - 147 mmol/L    Potassium 3.4 (L) 3.5 - 5.1 mmol/L    Chloride 107 98 - 110 mmol/L    Glucose 135 (H) 70 - 100 mg/dL    Blood Urea Nitrogen 76 (H) 7 - 25 mg/dL    Creatinine 0.95 (H) 0.40 - 1.00 mg/dL    Calcium  8.2 (L) 8.5 - 10.6 mg/dL    Total Protein 6.9 6.0 - 8.0 g/dL    Total Bilirubin 0.3 0.2 - 1.3 mg/dL    Albumin 3.4 (L) 3.5 - 5.0 g/dL    Alk Phosphatase 60 25 - 110 U/L    AST 18 7 - 40 U/L    ALT 12 7 - 56 U/L    CO2 21 21 - 30 mmol/L    Anion Gap 13 (H) 3 - 12    Glomerular Filtration Rate (GFR) 5 (L) >60 mL/min   MAGNESIUM     Collection Time: 04/25/24  3:58 AM   Result Value Ref Range    Magnesium  2.1 1.6 - 2.6 mg/dL   PHOSPHORUS    Collection Time: 04/25/24  3:58 AM   Result Value Ref Range    Phosphorus 4.5 2.0 - 4.5 mg/dL   POC GLUCOSE    Collection Time: 04/25/24  6:30 AM   Result Value Ref Range    Glucose, POC 143 (H) 70 - 100 mg/dL       Point of Care Testing  (Last 24 hours)  Glucose: (!) 135 (04/25/24 0358)  POC Glucose (Download): (!) 143 (04/25/24 0630)    Radiology and other Diagnostics Review:    Last Images past 24 hours:

## 2024-04-25 NOTE — Consults
 Pre Procedure History and Physical/Sedation Plan-OP    Procedure Date: 04/25/2024     Planned Procedure(s):  Tunneled dialysis catheter placement    Indication for exam:  Access for dialysis  __________________________________________________________________    Chief Complaint:  AKI    History of Present Illness: Jacqueline Shields is a 58 y.o. female with hx of ESRD s/p renal transplant in 2021, insulin  dependent diabetes, pyelonephritis admitted with AKI and UTI. Pt denies pain, nausea, SOB or fever today.    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    Diarrhea 04/18/2023    Weight loss 04/18/2023    Ulcer of esophagus without bleeding 04/18/2023    Gastric ulcer without hemorrhage or perforation 04/18/2023    Colonic ulcer 04/18/2023    Severe malnutrition 04/17/2023    Metabolic acidosis 01/20/2021    Status post biopsy of kidney 10/21/2020    Dyslipidemia 08/03/2020    ATN (acute tubular necrosis) 07/30/2020    Obesity 07/15/2020    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    Immunosuppression 07/15/2020    Coronary artery disease due to lipid rich plaque 07/03/2020    PVD (peripheral vascular disease) 06/15/2020    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    Hypertension     ESRD (end stage renal disease) (CMS-HCC)     Diabetes mellitus (CMS-HCC)      Past Medical History:    Abnormal biopsy of kidney    Anemia    Chronic kidney disease    Coronary artery disease due to lipid rich plaque    DM (diabetes mellitus), type 2 (CMS-HCC)    ESRD (end stage renal disease) (CMS-HCC)    Gastroparesis    GERD (gastroesophageal reflux disease)    H/O kidney transplant    HLD (hyperlipidemia)    Hypertension Kidney failure    Kidney stones    MGUS (monoclonal gammopathy of unknown significance)    Obesity    Proteinuria    PUD (peptic ulcer disease)    PVD (peripheral vascular disease)    Urinary tract infection      Surgical History:   Procedure Laterality Date    ANGIOGRAPHY CORONARY ARTERY WITH LEFT HEART CATHETERIZATION N/A 07/03/2020    Performed by Gupta, Kamal, MD at [facility] CATH LAB    POSSIBLE PERCUTANEOUS CORONARY STENT PLACEMENT WITH ANGIOPLASTY N/A 07/03/2020    Performed by Charlanne Francois, MD at [facility] CATH LAB    ALLOTRANSPLANTATION KIDNEY FROM NON LIVING DONOR WITHOUT RECIPIENT NEPHRECTOMY N/A 07/15/2020    Performed by Terrie Sieving, MD at [facility] OR    REMOVAL TUNNELED INTRAPERITONEAL CATHETER Bilateral 08/24/2020    Performed by Terrie Sieving, MD at [facility] OR    CYSTOURETHROSCOPY WITH REMOVAL FOREIGN BODY/ CALCULUS/ STENT FROM URETHRA/ BLADDER - SIMPLE Right 08/24/2020    Performed by Carolynn Ruther ORN, MD at [facility] OR    ESOPHAGOGASTRODUODENOSCOPY WITH BIOPSY - FLEXIBLE N/A 04/17/2023    Performed by Chipper Neas, MD at [facility] ENDO    COLONOSCOPY WITH BIOPSY - FLEXIBLE N/A 04/17/2023    Performed by Chipper Neas, MD at [facility] ENDO    ESOPHAGOGASTRODUODENOSCOPY WITH SPECIMEN COLLECTION BY BRUSHING/ WASHING N/A 07/21/2023    Performed  by Arlan Nancyann PARAS, MD at [facility] ENDO    ANTEGRADE UROGRAPHY Right 09/22/2023    Performed by Erskin Alm LABOR, MD at [facility] OR    RETROGRADE UROGRAPHY WITH/ WITHOUT KUB Right 09/22/2023    Performed by Erskin Alm LABOR, MD at [facility] OR    REMOVAL, NEPHROSTOMY TUBE, WITH FLUOROSCOPIC GUIDANCE Right 09/22/2023    Performed by Erskin Alm LABOR, MD at [facility] OR    CYSTOURETHROSCOPY WITH URETEROSCOPY WITH TREATMENT URETERAL STRICTURE Right 09/22/2023    Performed by Erskin Alm LABOR, MD at [facility] OR    CYSTOURETHROSCOPY WITH URETEROSCOPY AND/ OR PYELOSCOPY - DIAGNOSTIC Right 11/17/2023    Performed by Erskin Alm LABOR, MD at [facility] OR    RETROGRADE UROGRAPHY WITH/ WITHOUT KUB Right 11/17/2023    Performed by Erskin Alm LABOR, MD at [facility] OR    CYSTOURETHROSCOPY WITH REMOVAL FOREIGN BODY/ CALCULUS/ STENT FROM URETHRA/ BLADDER - SIMPLE Right 11/17/2023    Performed by Erskin Alm LABOR, MD at [facility] OR    CATHETER IMPLANT/REVISION      PD cath    CESAREAN SECTION  1991    HX CESAREAN SECTION  1991    HX HYSTERECTOMY  2000    still has ovaries    HX LITHOTRIPSY      HX TUBAL LIGATION  1995      Prescriptions Prior to Admission[1]  Allergies[2]    Social History:   Social History     Tobacco Use    Smoking status: Never    Smokeless tobacco: Never   Substance Use Topics    Alcohol use: Not Currently      Family History   Problem Relation Name Age of Onset    Diabetes Mother Mom     Hypertension Mother Mom     Diabetes Father Lukemia     Hypertension Father Lukemia     Cancer-Hematologic Father Lukemia     Cancer Father Lukemia     Diabetes Sister Macario     Migraines Sister Macario     Cancer Sister Macario         Endometrial        Review of Systems  Constitutional: negative for fevers  Respiratory: negative for cough or increased work of breathing  Cardiovascular: negative for chest pain  Gastrointestinal: negative for nausea, vomiting, and abdominal pain    Previous Anesthetic/Sedation History:  Reviewed    Code Status: Full Code    Physical Exam:  Vital Signs: Last Filed In 24 Hours Vital Signs: 24 Hour Range   BP: 113/77 (09/25 0800)  Temp: 36.9 ?C (98.4 ?F) (09/25 0800)  Pulse: 78 (09/25 0800)  Respirations: 18 PER MINUTE (09/25 0800)  SpO2: 100 % (09/25 0800)  O2 Device: None (Room air) (09/25 0800) BP: (98-113)/(67-94)   Temp:  [36.4 ?C (97.5 ?F)-37.1 ?C (98.8 ?F)]   Pulse:  [74-86]   Respirations:  [16 PER MINUTE-21 PER MINUTE]   SpO2:  [96 %-100 %]   O2 Device: None (Room air)          General appearance: alert and no distress  Neurologic: Grossly normal, at baseline  Lungs: Nonlabored with normal effort  Abdomen: soft, non-tender.   Extremities: extremities normal, atraumatic, no cyanosis or edema        Airway:  airway assessment performed  Mallampati I (soft palate, uvula, fauces, tonsillar pillars visible)   Anesthesia Classification:  ASA III (A patient with a severe systemic disease that limits activity, but is not  incapacitating)  Pre procedure anxiolysis plan: Midazolam   Sedation/Medication Plan: Fentanyl , Lidocaine , and Midazolam   Personal history of sedation complications: Denies adverse event.   Family history of sedation complications: Denies adverse event.   Medications for Reversal: Naloxone and Flumazenil  Discussion/Reviews:  Physician has discussed risks and alternatives of this type of sedation and above planned procedures with patient  NPO Status: Acceptable  Pregnancy Status: N/A        Lab/Radiology/Other Diagnostic Tests:  Labs:  24-hour labs:    Results for orders placed or performed during the hospital encounter of 04/21/24 (from the past 24 hours)   POC GLUCOSE    Collection Time: 04/24/24  9:50 AM   Result Value Ref Range    Glucose, POC 253 (H) 70 - 100 mg/dL   POC GLUCOSE    Collection Time: 04/24/24  2:04 PM   Result Value Ref Range    Glucose, POC 214 (H) 70 - 100 mg/dL   POC GLUCOSE    Collection Time: 04/24/24  6:11 PM   Result Value Ref Range    Glucose, POC 219 (H) 70 - 100 mg/dL   POC GLUCOSE    Collection Time: 04/24/24  9:47 PM   Result Value Ref Range    Glucose, POC 130 (H) 70 - 100 mg/dL   CBC AND DIFF    Collection Time: 04/25/24  3:58 AM   Result Value Ref Range    White Blood Cells 5.30 4.50 - 11.00 10*3/uL    Red Blood Cells 3.83 (L) 4.00 - 5.00 10*6/uL    Hemoglobin 9.7 (L) 12.0 - 15.0 g/dL    Hematocrit 70.1 (L) 36.0 - 45.0 %    MCV 77.7 (L) 80.0 - 100.0 fL    MCH 25.3 (L) 26.0 - 34.0 pg    MCHC 32.6 32.0 - 36.0 g/dL    RDW 82.9 (H) 88.9 - 15.0 %    Platelet Count 238 150 - 400 10*3/uL    MPV 8.0 7.0 - 11.0 fL    Neutrophils 60.3 41.0 - 77.0 %    Lymphocytes 18.2 (L) 24.0 - 44.0 %    Monocytes 15.5 (H) 4.0 - 12.0 %    Eosinophils 5.1 (H) 0.0 - 5.0 %    Basophils 0.9 0.0 - 2.0 %    Absolute Neutrophil Count 3.20 1.80 - 7.00 10*3/uL    Absolute Lymph Count 1.00 1.00 - 4.80 10*3/uL    Absolute Monocyte Count 0.80 0.00 - 0.80 10*3/uL    Absolute Eosinophil Count 0.30 0.00 - 0.45 10*3/uL    Absolute Basophil Count 0.00 0.00 - 0.20 10*3/uL    MDW (Monocyte Distribution Width) 18.3 <=20.6   COMPREHENSIVE METABOLIC PANEL    Collection Time: 04/25/24  3:58 AM   Result Value Ref Range    Sodium 141 137 - 147 mmol/L    Potassium 3.4 (L) 3.5 - 5.1 mmol/L    Chloride 107 98 - 110 mmol/L    Glucose 135 (H) 70 - 100 mg/dL    Blood Urea Nitrogen 76 (H) 7 - 25 mg/dL    Creatinine 0.95 (H) 0.40 - 1.00 mg/dL    Calcium  8.2 (L) 8.5 - 10.6 mg/dL    Total Protein 6.9 6.0 - 8.0 g/dL    Total Bilirubin 0.3 0.2 - 1.3 mg/dL    Albumin 3.4 (L) 3.5 - 5.0 g/dL    Alk Phosphatase 60 25 - 110 U/L    AST 18 7 - 40 U/L    ALT  12 7 - 56 U/L    CO2 21 21 - 30 mmol/L    Anion Gap 13 (H) 3 - 12    Glomerular Filtration Rate (GFR) 5 (L) >60 mL/min   MAGNESIUM     Collection Time: 04/25/24  3:58 AM   Result Value Ref Range    Magnesium  2.1 1.6 - 2.6 mg/dL   PHOSPHORUS    Collection Time: 04/25/24  3:58 AM   Result Value Ref Range    Phosphorus 4.5 2.0 - 4.5 mg/dL   POC GLUCOSE    Collection Time: 04/25/24  6:30 AM   Result Value Ref Range    Glucose, POC 143 (H) 70 - 100 mg/dL              Deitra FORBES Sprinkle, APRN-NP  Pager [phone]         [1]   Facility-Administered Medications Prior to Admission   Medication Dose Route Frequency Provider Last Rate Last Admin    darbepoetin alfa  (ARANESP ) (*) injection 100 mcg  100 mcg Subcutaneous Q7 Days Gupta, Mallika, MD   100 mcg at 04/19/24 1101     Medications Prior to Admission   Medication Sig Dispense Refill Last Dose/Taking    aspirin  EC (ASPIR-LOW) 81 mg tablet Take one tablet by mouth daily. Take with food. 90 tablet 3     blood sugar diagnostic (ONETOUCH VERIO TEST STRIPS) test strip Use one strip as directed before meals and at bedtime. ICD-10: Type II Diabetes with unspecified complications; with long term insulin  use E11.8, Z79.4  Indications: type 2 diabetes mellitus 300 strip 3     CALCIUM  PO Take 1 tablet by mouth daily.       CHOLEcalciferoL  (vitamin D3) 50 mcg (2,000 unit) capsule TAKE 1 CAPSULE BY MOUTH EVERY DAY 90 capsule 3     DEXCOM G7 SENSOR sensor device Use one each as directed every 10 days. Indications: type 2 diabetes mellitus 9 each 3     everolimus  (immunosuppressive) (ZORTRESS ) 1 mg tablet Take one tablet by mouth twice daily. Indications: prevent kidney transplant rejection, Z94.0 180 tablet 3     ferrous sulfate  (FEOSOL) 325 mg (65 mg iron ) tablet Take one tablet by mouth at bedtime daily. Take on an empty stomach at least 1 hour before or 2 hours after food. (Patient taking differently: Take one tablet by mouth daily before breakfast. Take on an empty stomach at least 1 hour before or 2 hours after food.) 30 tablet 3     glucose (DEX4 GLUCOSE) 4 gram chewable tablet Chew four tablets by mouth as Needed. Indications: low blood sugar 50 tablet 0     insulin  aspart (U-100) (NOVOLOG  FLEXPEN U-100 INSULIN ) 100 unit/mL (3 mL) PEN Inject 10 units subcutaneously with breakfast, 8 units with lunch, and 10 units with evening meal PLUS sliding scale If blood sugar 151-200: Take extra 2 unit. If blood sugar 201-250: Take extra 4 units. If blood sugar 251-300: Take extra 6 units. If blood sugar is 301-350: Take extra 8 units. If blood sugars > 350: Take extra 10 units. Max daily dose 45 units.  Indications: type 2 diabetes mellitus 45 mL 1     insulin  degludec (TRESIBA FLEXTOUCH U-100) 100 unit/mL (3 mL) subcutaneous PEN Inject sixty Units under the skin at bedtime daily.       insulin  glargine (LANTUS  SOLOSTAR U-100 INSULIN ) 100 unit/mL (3 mL) subcutaneous PEN Inject fifty Units under the skin at bedtime daily. Indications: type 2 diabetes mellitus (Patient  not taking: Reported on 04/03/2024) 60 mL 3     lancets 33 gauge (ONETOUCH DELICA 33 GUAGE) 33 gauge Use one each as directed four times daily. ICD-10: Type II Diabetes with unspecified complications; with long term insulin  use E11.8, Z79.4 300 each 3     oxyBUTYnin  chloride (DITROPAN ) 5 mg tablet Take one tablet by mouth twice daily as needed. Indications: a condition where the urge to urinate results in urine leakage 60 tablet 0     pen needle, diabetic (BD ULTRA-FINE NANO PEN NEEDLE) 32 gauge x 5/32 pen needle Use one pen needle as directed as Needed. Use with insulin  injections. 300 each 3     rosuvastatin  (CRESTOR ) 10 mg tablet Take one tablet by mouth daily. Indications: excessive fat in the blood 60 tablet 0     tacrolimus  (PROGRAF ) 1 mg capsule Take one capsule by mouth twice daily. Indications: prevent kidney transplant rejection 180 capsule 3     tirzepatide  (MOUNJARO ) 7.5 mg/0.5 mL injector PEN Inject 0.5 mL under the skin every 7 days. Indications: type 2 diabetes mellitus 6 mL 3    [2] No Known Allergies

## 2024-04-26 ENCOUNTER — Inpatient Hospital Stay: Admission: EM | Admit: 2024-04-21 | Payer: PRIVATE HEALTH INSURANCE

## 2024-04-26 ENCOUNTER — Inpatient Hospital Stay: Admit: 2024-04-26 | Discharge: 2024-04-26 | Payer: PRIVATE HEALTH INSURANCE

## 2024-04-26 DIAGNOSIS — N136 Pyonephrosis: Principal | ICD-10-CM

## 2024-04-26 LAB — POC GLUCOSE
~~LOC~~ BKR POC GLUCOSE: 176 mg/dL — ABNORMAL HIGH (ref 70–100)
~~LOC~~ BKR POC GLUCOSE: 383 mg/dL — ABNORMAL HIGH (ref 70–100)
~~LOC~~ BKR POC GLUCOSE: 190 mg/dL — ABNORMAL HIGH (ref 70–100)
~~LOC~~ BKR POC GLUCOSE: 200 mg/dL — ABNORMAL HIGH (ref 70–100)
~~LOC~~ BKR POC GLUCOSE: 271 mg/dL — ABNORMAL HIGH (ref 70–100)

## 2024-04-26 LAB — OXALATE, PLASMA: ~~LOC~~ BKR OXALATE, PLASMA: 14 umol/L — ABNORMAL HIGH (ref <=2.0)

## 2024-04-26 MED ORDER — SODIUM CHLORIDE 0.9% IV BOLUS
100 mL | INTRAVENOUS | 0 refills | Status: DC | PRN
Start: 2024-04-26 — End: 2024-04-27

## 2024-04-26 MED ORDER — SODIUM CITRATE 4 % (3 ML) IK SYRG
1-6 mL | Freq: Once | 0 refills | Status: CP
Start: 2024-04-26 — End: ?
  Administered 2024-04-26: 22:00:00 3.2 mL

## 2024-04-26 MED ORDER — SODIUM CHLORIDE 0.9% IV BOLUS
300 mL | INTRAVENOUS | 0 refills | Status: DC | PRN
Start: 2024-04-26 — End: 2024-04-27
  Administered 2024-04-26: 22:00:00 300 mL via INTRAVENOUS

## 2024-04-26 MED ORDER — POTASSIUM CHLORIDE 20 MEQ PO TBTQ
20 meq | Freq: Once | ORAL | 0 refills | Status: CP
Start: 2024-04-26 — End: ?
  Administered 2024-04-26: 15:00:00 20 meq via ORAL

## 2024-04-26 NOTE — Care Plan
 Problem: Glucose Management  Goal: Absence of hyperglycemia  Flowsheets (Taken 04/26/2024 0524)  Absence of hyperglycemia:   Assess for sign and symptoms of hyperglycemia   Provide causes of hyperglycemia education   Administer pharmacological therapies as ordered  Goal: Glucose level within specified parameters  Flowsheets (Taken 04/26/2024 0524)  Glucose level within specified parameters: Perform blood glucose monitoring as ordered and/or patient is symptomatic     Problem: Moderate Fall Risk  Goal: Moderate Fall Risk  Flowsheets (Taken 04/26/2024 0524)  Moderate Fall Risk:   All patients will receive: High fall risk sign, yellow wristband, yellow socks, gait belt, and shower shoes   Engage bed/chair alarm

## 2024-04-26 NOTE — Case Management (ED)
 Case Management Progress Note    NAME:Jacqueline Shields                          MRN: [id number]              DOB:[birth year]          AGE: 58 y.o.  ADMISSION DATE: 04/21/2024             DAYS ADMITTED: LOS: 5 days      Today's Date: 04/26/2024    PLAN: Patient plans to discharge home when medically stable  w new HD outpt    Expected Discharge Date: 04/29/2024   Is Patient Medically Stable: No, Please explain: chair dates and times  Are there Barriers to Discharge? no    INTERVENTION/DISPOSITION:  Discharge Planning                   NCM will attended huddle.  NCM reviewed EMR, labs, MAR and Notes.  NCM sent Hep B panels and CXR to [facility] for review  Awaiting chair dates and times.  No ongoing therapy noted.  No DME requested.  No other CM needs identified at this time  NCM will continue to assess and monitor for changes    Transportation              Does the Patient Need Case Management to Arrange Discharge Transport? (ex: facility, ambulance, wheelchair/stretcher, Medicaid, cab, other): No  Will the Patient Use Family Transport?: Yes  Transportation Name, Phone and Availability #1: Husband- Scientist, forensic or Referral                 Positive SDOH Domains and Potential Barriers                   Medication Needs                                                                                                                                                         Estate manager/land agent                 Other                 Discharge Disposition  Selected Continued Care - Admitted Since 04/21/2024    No services have been selected for the patient.           Alverna Rase, RN

## 2024-04-26 NOTE — Procedures
 Name: Jacqueline Shields            MRN: [id number]                DOB: 30-Aug-1965          Age: 58 y.o.  Admission Date: 04/21/2024             LOS: 5 days    Hemodialysis Procedure Report    Report received from Primary RN, [place] at [phone].  Hemodialysis treatment performed at [facility].  Patient arrived to Unit at 1640 with transport via Wheelchair.  Patient ID and consent verified: Yes   Lab and orders reviewed: Yes .  Patient attached to Cardiac, NIBP, SpO2, and Critline  Access:Tunneled HD Catheter on right side.  Catheter lumens accessed. aspirated, and flushed without difficulty.  Comment/Event: consent formed signed by patient.    TREATMENT START TIME: 1645  Prescribed treatment parameters achieved at treatment start.  Patient's face uncovered and in view: Yes .  Lines and access secure, in view and intact: Yes .  Dr. Bonner notified at 1658 patient is on treatment and estimated time off.  Symptoms or events during treatment: none.    TREATMENT END TIME: 1851  Net UF: 1 L.  Kt/v: 0.86  End weight: 85 kg per estimate.  Catheter lumens flushed and packed per MAR.  Red dead end caps applied.    Report given to primary care RN [place] at [phone].  Patient left unit via Wheelchair with Dialysis Staff.    No further questions or concerns at this time.    See Hemodialysis flowsheet and MAR for further details.

## 2024-04-26 NOTE — Progress Notes
 General Progress Note    Name: Jacqueline Shields        MRN: [id number]          DOB: 06-03-1966            Age: 58 y.o.  Admission Date: 04/21/2024       LOS: 5 days    Date of Service: 04/26/2024    Assessment/Plan:      Jacqueline Shields is a 59 y.o. 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 IR renal biopsy performed on 9/23. Renal transplant team recommending initiation of HD. IR HD line ordered on 9/25. HD to initiate over the weekend and be set up outpatient. Treated 5 days with IV CTX for UTI. Clinically improving. Likely home over weekend vs Monday once dialysis initiated/arranged and foley removed.     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 Intra-renal etiology  vs rejection  PLAN:  > Nephrology transplant consulted, anticipate possible dialysis +/- kidney biopsy 04/22/2024   > Start PTA Everolimus  1 mg BID  > Start PTA Tacrolimus  1 mg BID   > Ultrasound of transplanted kidney ordered   > Daily tacrolimus  levels   > IR renal biopsy on 9/23, pending results  > Renal transplant recommending HD intiatiation  > IR HD line ordered on 9/25  > Initiation HD on 9/26, she prefers HD outpatient on M/W/F at [facility] in [place]. [place].    Urinary tract infection   Nausea/ Vomiting, resolved   Nonobstructive nephrolithiasis   Severe hydronephrosis of R transplant kidney, chronic   Hydroureter of R transplant kidney , chronic    - Recent renal ultrasound 03/15/2024 showed mild hydroureteronephrosis, decreased since September 2024, progressive decrease in size and cortical thickness of transplant kidney.   CT abdomen/ pelvis: 1.8 cm cavitary lesion in right middle lobe with slightly improved aeration. Interval development of mild left sided hydronephrosis. Tiny nonobstructive calculi within lower pole calices of right kidney. Redemonstration of right pelvic renal transplant with severe hydronephrosis with associated hydroureter. Moderately distended urinary bladder with wall thickening.  - UA: 2+ turbidity, 3+ glucose, 1+ ketones, 3+ protein, 2+ blood, 3+ leukocytes, packed WBC, packed RBC, packed bacteria  > s/p LR at 125 mL/ hour   > Start IV Ceftriaxone  2 g daily, monitor for toxicities, plan for 5 days  > Foley in place, voiding trial prior to discharge     Coronary artery disease  Dyslipidemia  - LHC 2021: 40% to 50% percent mid right coronary artery disease.Small branch vessel 80% disease with involving the superior branch of medium caliber diagonal vessel.  > Continue Rosuvastatin  10 mg daily   > HOLD PTA ASA 81 mg daily for renal biopsy and per Neph recs, likely resume at discharge     Chronic anemia of renal disease, stable  - Hemoglobin 10.2, MCV 76.2 on admission  - Baseline 8-8.7      Insulin  Dependent Type II Diabetes  -  A1C: 6.3  - PTA: Triseba 50 U QHS, Aspart 10 U with breakfast, 8 U with lunch, 10 U with dinner   - PTA: Mounjaro  7.5 q 7 days   > Restart PTA Glargine 50 U QHS, Aspart 12U with breakfast, 12U with lunch, 12U with dinner  > Hold PTA Mounjaro       Diet: Renal  VTE ppx: Heparin   CODE: Full   DISPO: Admit to medicine.      [facility] Medicine    Voalte is the preferred method of communication for Internal Medicine. Please search voalte directory for Med Private First Call assigned team name to find the correct covering doctor or nurse practitioner 24/7. You can find the assigned team in the information bar to the left in the patient chart under service.  Personal Voaltes and pagers are not answered at all hours.      High medical decision making due to the following:  1 or more chronic illness with severe exacerbation  drug therapy requiring intensive monitoring for toxicityIV CTX      Assessment & Plan  Metabolic acidosis    Diabetes mellitus (CMS-HCC)    AKI (acute kidney injury)    Urinary tract infection    High anion gap metabolic acidosis    Severe malnutrition       Present on Admission:   Metabolic acidosis   AKI (acute kidney injury)   Diabetes mellitus (CMS-HCC)   Severe malnutrition     _______________________________________________________________________    Subjective       She is overall feeling better.  She feels like she has more energy.  Her urine has really cleared up and is now clear yellow.  No nausea vomiting.  She is anxious to start dialysis and come up with an outpatient plan.  She prefers dialysis on Monday Wednesdays and Fridays.  She would like to follow-up at [facility] in [place].  She still has a Foley in place and is anxious to have that removed in the coming days.      Malnutrition Details:  Malnutrition present  ICD-10 code E43: Acute illness/Severe malnutrition    Weight loss: Greater than 5% x 1 month, Energy intake: 50% or less of estimated energy requirement for 5 days or more                                     Malnutrition Interventions: Assessed nutritional status & provided recommendations     Active Wounds                            Wounds Puncture Right;Lower Abdomen (Active)   04/23/24 0920   Wound Type: Puncture   Orientation: Right;Lower   Location: Abdomen   Wound Location Comments:    Initial Wound Site Closure:    Initial Dressing Placed: Transparent (i.e. Tegaderm);Gauze   Initial Cycle:    Initial Suction Setting (mmHg):    Pressure Injury Stages:    Pressure Injury Present Within 24 Hours of Hospital Admission:    If This Pressure Injury Is Suspected to Be Device Related, Please Select the Device::    Is the Wound Open or Closed:    Wound Assessment Dressing not removed for assessment 04/25/24 0800   Peri-wound Assessment Dry;Intact 04/25/24 0800   Wound Drainage Amount None 04/25/24 0800   Wound Dressing Status Intact  04/25/24 0800   Number of days: 3       Wounds Puncture Anterior;Right Neck (Active)   04/25/24 1546   Wound Type: Puncture   Orientation: Anterior;Right   Location: Neck   Wound Location Comments: HD Line Tunnel Site   Initial Wound Site Closure:    Initial Dressing Placed:    Initial Cycle:    Initial Suction Setting (mmHg):    Pressure Injury Stages:    Pressure Injury Present Within 24 Hours of Hospital Admission:    If This Pressure Injury Is Suspected to Be Device Related, Please Select the Device::    Is the Wound Open or Closed:    Wound Assessment Dressing not removed for assessment 04/26/24 0400   Peri-wound Assessment Dry;Intact 04/26/24 0400   Wound Drainage Amount None 04/26/24 0400   Wound Dressing Status Intact 04/26/24 0400   Wound Care Dressing changed or new application 04/25/24 1600   Wound Dressing and/or Treatment Primapore 04/26/24 0400   Dressing Change Due 04/26/24 04/26/24 0400   Number of days: 1                    Medications  Scheduled Meds:cefTRIAXone  (ROCEPHIN ) IVP 2 g, 2 g, Intravenous, Q24H*  heparin  (porcine) PF syringe 5,000 Units, 5,000 Units, Subcutaneous, Q8H  influenza (=>65 Yo) trivalent HIGH DOSE (FLUZONE) 180 mcg/0.5 mL 2025-26 PF syringe 0.5 mL, 0.5 mL, Intramuscular, ONCE  insulin  aspart (U-100) (NOVOLOG  FLEXPEN U-100 INSULIN ) injection PEN 0-6 Units, 0-6 Units, Subcutaneous, ACHS (22)  insulin  aspart (U-100) (NOVOLOG  FLEXPEN U-100 INSULIN ) injection PEN 12 Units, 12 Units, Subcutaneous, QDAY w/breakfast  insulin  aspart (U-100) (NOVOLOG  FLEXPEN U-100 INSULIN ) injection PEN 12 Units, 12 Units, Subcutaneous, QDAY w/dinner  insulin  aspart (U-100) (NOVOLOG  FLEXPEN U-100 INSULIN ) injection PEN 12 Units, 12 Units, Subcutaneous, QDAY w/lunch  insulin  glargine (LANTUS  SOLOSTAR U-100 INSULIN ) injection PEN 50 Units, 50 Units, Subcutaneous, QHS(22)  rosuvastatin  (CRESTOR ) tablet 10 mg, 10 mg, Oral, QDAY  sodium bicarbonate  tablet 1,300 mg, 1,300 mg, Oral, BID  tacrolimus  (PROGRAF ) capsule 0.5 mg, 0.5 mg, Oral, BID    Continuous Infusions:      PRN and Respiratory Meds:acetaminophen  Q4H PRN, calcium  carbonate Q4H PRN, dextrose  50% PRN, melatonin QHS PRN, ondansetron  Q6H PRN **OR** ondansetron  Q6H PRN, oxyBUTYnin  chloride BID PRN, polyethylene glycol 3350  QDAY PRN, sennosides-docusate sodium  QDAY PRN                Objective                        Vital Signs: Last Filed                 Vital Signs: 24 Hour Range   BP: 113/68 (09/26 0800)  Temp: 36.2 ?C (97.1 ?F) (09/26 0800)  Pulse: 74 (09/26 0800)  Respirations: 16 PER MINUTE (09/26 0800)  SpO2: 98 % (09/26 0800)  O2 Device: None (Room air) (09/26 0800)  O2 Liter Flow: 4 Lpm (09/25 1600) BP: (99-139)/(61-84)   Temp:  [36.2 ?C (97.1 ?F)-37.1 ?C (98.7 ?F)]   Pulse:  [69-90]   Respirations:  [11 PER MINUTE-24 PER MINUTE]   SpO2:  [93 %-100 %]   O2 Device: None (Room air)  O2 Liter Flow: 4 Lpm   Intensity Pain Scale (Self Report): 5 (04/26/24 0416) Vitals:    04/21/24 1846   Weight: 84.3 kg (185 lb 12.8 oz)       Intake/Output Summary:  (Last 24 hours)  Intake/Output Summary (Last 24 hours) at 04/26/2024 0817  Last data filed at 04/26/2024 0600  Gross per 24 hour   Intake 475 ml   Output 1285 ml   Net -810 ml     Stool Occurrence: 1 (reported per patient)        Physical Exam  GEN: Awake, alert, NAD, appears well today   CV: S1S2 RRR, no murmurs  RESP: No respiratory distress, clear to auscultation, no wheezes or rhonchi  ABD: Soft, nontender, non distended, good bowel sounds  EXT: No LE edema  GU: Foley in place, clear yellow urine    Lab Review  24-hour labs:    Results for orders placed or performed during the hospital encounter of 04/21/24 (from the past 24 hours)   POC GLUCOSE    Collection Time: 04/25/24 11:47 AM   Result Value Ref Range    Glucose, POC 176 (H) 70 - 100 mg/dL   POC GLUCOSE    Collection Time: 04/25/24 10:20 PM   Result Value Ref Range    Glucose, POC 383 (H) 70 - 100 mg/dL   CBC AND DIFF    Collection Time: 04/26/24  4:06 AM   Result Value Ref Range    White Blood Cells 5.50 4.50 - 11.00 10*3/uL    Red Blood Cells 3.83 (L) 4.00 - 5.00 10*6/uL    Hemoglobin 10.1 (L) 12.0 - 15.0 g/dL    Hematocrit 70.0 (L) 36.0 - 45.0 %    MCV 78.1 (L) 80.0 - 100.0 fL    MCH 26.5 26.0 - 34.0 pg    MCHC 33.9 32.0 - 36.0 g/dL    RDW 82.3 (H) 88.9 - 15.0 %    Platelet Count 267 150 - 400 10*3/uL    MPV 8.4 7.0 - 11.0 fL    Neutrophils 66.5 41.0 - 77.0 %    Lymphocytes 17.3 (L) 24.0 - 44.0 %    Monocytes 11.5 4.0 - 12.0 %    Eosinophils 3.9 0.0 - 5.0 %    Basophils 0.8 0.0 - 2.0 %    Absolute Neutrophil Count 3.60 1.80 - 7.00 10*3/uL    Absolute Lymph Count 0.90 (L) 1.00 - 4.80 10*3/uL    Absolute Monocyte Count 0.60 0.00 - 0.80 10*3/uL    Absolute Eosinophil Count 0.20 0.00 - 0.45 10*3/uL    Absolute Basophil Count 0.00 0.00 - 0.20 10*3/uL    MDW (Monocyte Distribution Width) 16.8 <=20.6   COMPREHENSIVE METABOLIC PANEL    Collection Time: 04/26/24  4:06 AM   Result Value Ref Range    Sodium 140 137 - 147 mmol/L    Potassium 3.3 (L) 3.5 - 5.1 mmol/L    Chloride 103 98 - 110 mmol/L    Glucose 296 (H) 70 - 100 mg/dL    Blood Urea Nitrogen 78 (H) 7 - 25 mg/dL    Creatinine 1.66 (H) 0.40 - 1.00 mg/dL    Calcium  8.7 8.5 - 10.6 mg/dL    Total Protein 7.2 6.0 - 8.0 g/dL    Total Bilirubin 0.3 0.2 - 1.3 mg/dL    Albumin 3.6 3.5 - 5.0 g/dL    Alk Phosphatase 75 25 - 110 U/L    AST 20 7 - 40 U/L    ALT 16 7 - 56 U/L    CO2 25 21 - 30 mmol/L    Anion Gap 12 3 - 12    Glomerular Filtration Rate (GFR) 5 (L) >60 mL/min   MAGNESIUM   Collection Time: 04/26/24  4:06 AM   Result Value Ref Range    Magnesium  2.2 1.6 - 2.6 mg/dL   PHOSPHORUS    Collection Time: 04/26/24  4:06 AM   Result Value Ref Range    Phosphorus 5.2 (H) 2.0 - 4.5 mg/dL   HEPATITIS B SURFACE AB    Collection Time: 04/26/24  4:06 AM Result Value Ref Range    Anti HBs Negative Negative   HEPATITIS B CORE AB TOT (IGG+IGM)    Collection Time: 04/26/24  4:06 AM   Result Value Ref Range    Anti HBc Total Non-reactive Non-reactive   HEPATITIS B SURFACE AG    Collection Time: 04/26/24  4:06 AM   Result Value Ref Range    HBsAg Non-reactive Non-reactive   POC GLUCOSE    Collection Time: 04/26/24  6:07 AM   Result Value Ref Range    Glucose, POC 200 (H) 70 - 100 mg/dL   POC GLUCOSE    Collection Time: 04/26/24  8:13 AM   Result Value Ref Range    Glucose, POC 190 (H) 70 - 100 mg/dL       Point of Care Testing  (Last 24 hours)  Glucose: (!) 296 (04/26/24 0406)  POC Glucose (Download): (!) 190 (04/26/24 0813)    Radiology and other Diagnostics Review:    Last Images past 24 hours:   Radiology  (Last 24 hours)                 04/25/24 1542  IR DIALYSIS CATHETER PLACEMENT Final result    Impression:  Impression: Placement of a right chest tunneled hemodialysis catheter as described above.       I, Dale Cave, M.D., the attending radiologist, was present for the procedure, personally reviewed the images, and formulated the interpretations and opinions expressed in this report.       @TT             Finalized by Dale Cave, M.D. on 04/25/2024 4:34 PM. Dictated by Dale Cave, M.D. on 04/25/2024 4:32 PM.       04/25/24 1202  CHEST SINGLE VIEW Final result    Impression:          No acute cardiopulmonary abnormality.            Finalized by Duwaine Eagles, M.D. on 04/25/2024 12:28 PM. Dictated by Duwaine Eagles, M.D. on 04/25/2024 12:28 PM.

## 2024-04-27 ENCOUNTER — Inpatient Hospital Stay: Admit: 2024-04-27 | Discharge: 2024-04-27 | Payer: PRIVATE HEALTH INSURANCE

## 2024-04-27 LAB — MANUAL DIFF
~~LOC~~ BKR BASOPHILS - RELATIVE: 1 % (ref 0–2)
~~LOC~~ BKR EOSINOPHILS - RELATIVE: 6 % — ABNORMAL HIGH (ref 0–5)
~~LOC~~ BKR METAMYELOCYTES - RELATIVE: 2 %
~~LOC~~ BKR MONOCYTES - RELATIVE: 6 % (ref 4–12)
~~LOC~~ BKR NEUT+BANDS - ABSOLUTE: 5.3 10*3/uL (ref 1.8–7.0)
~~LOC~~ BKR NEUTROPHILS - RELATIVE: 76 % (ref 41–77)

## 2024-04-27 LAB — POC GLUCOSE
~~LOC~~ BKR POC GLUCOSE: 230 mg/dL — ABNORMAL HIGH (ref 70–100)
~~LOC~~ BKR POC GLUCOSE: 174 mg/dL — ABNORMAL HIGH (ref 70–100)
~~LOC~~ BKR POC GLUCOSE: 191 mg/dL — ABNORMAL HIGH (ref 70–100)
~~LOC~~ BKR POC GLUCOSE: 214 mg/dL — ABNORMAL HIGH (ref 70–100)

## 2024-04-27 LAB — TACROLIMUS: ~~LOC~~ BKR TACROLIMUS: 5.1 ng/mL — ABNORMAL HIGH (ref 5.0–15.0)

## 2024-04-27 LAB — CBC AND DIFF: ~~LOC~~ BKR WBC COUNT: 7 10*3/uL — ABNORMAL HIGH (ref 4.50–11.00)

## 2024-04-27 MED ORDER — SODIUM CHLORIDE 0.9% IV BOLUS
100 mL | INTRAVENOUS | 0 refills | Status: DC | PRN
Start: 2024-04-27 — End: 2024-04-27

## 2024-04-27 MED ORDER — SODIUM CITRATE 4 % (3 ML) IK SYRG
1-6 mL | Freq: Once | 0 refills | Status: CP
Start: 2024-04-27 — End: ?

## 2024-04-27 MED ORDER — SODIUM CHLORIDE 0.9% IV BOLUS
300 mL | INTRAVENOUS | 0 refills | Status: DC | PRN
Start: 2024-04-27 — End: 2024-04-27
  Administered 2024-04-27: 18:00:00 300 mL via INTRAVENOUS

## 2024-04-27 MED ADMIN — SODIUM CITRATE 4 % (3 ML) IK SYRG [301113]: 3.6 mL | @ 18:00:00 | Stop: 2024-04-27 | NDC 70092169244

## 2024-04-27 NOTE — Progress Notes
 General Progress Note    Name: Jacqueline Shields        MRN: [id number]          DOB: 07/28/1966            Age: 58 y.o.  Admission Date: 04/21/2024       LOS: 6 days    Date of Service: 04/27/2024    Assessment/Plan:      Jacqueline Shields is a 58 y.o. 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 IR renal biopsy performed on 9/23. Renal transplant team recommending initiation of HD. IR HD line ordered on 9/25. HD to initiate over the weekend and be set up outpatient. Treated 5 days with IV CTX for UTI. Clinically improving. Likely home over weekend vs Monday once dialysis initiated/arranged and foley removed.     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  > Initiation HD on 9/26, she prefers HD outpatient on M/W/F at [facility] in [place]. Joe.    Urinary tract infection   Nausea/ Vomiting, resolved   Nonobstructive nephrolithiasis   Severe hydronephrosis of R transplant kidney, chronic   Hydroureter of R transplant kidney , chronic    - Recent renal ultrasound 03/15/2024 showed mild hydroureteronephrosis, decreased since September 2024, progressive decrease in size and cortical thickness of transplant kidney.   CT abdomen/ pelvis: 1.8 cm cavitary lesion in right middle lobe with slightly improved aeration. Interval development of mild left sided hydronephrosis. Tiny nonobstructive calculi within lower pole calices of right kidney. Redemonstration of right pelvic renal transplant with severe hydronephrosis with associated hydroureter. Moderately distended urinary bladder with wall thickening.  - UA: 2+ turbidity, 3+ glucose, 1+ ketones, 3+ protein, 2+ blood, 3+ leukocytes, packed WBC, packed RBC, packed bacteria  > s/p LR at 125 mL/ hour   >  IV Ceftriaxone  2 g daily, monitor for toxicities, plan for 5 days  > Foley in place, voiding trial prior to discharge today      Coronary artery disease  Dyslipidemia  - LHC 2021: 40% to 50% percent mid right coronary artery disease.Small branch vessel 80% disease with involving the superior branch of medium caliber diagonal vessel.  > Continue Rosuvastatin  10 mg daily   > HOLD PTA ASA 81 mg daily for renal biopsy and per Neph recs, likely resume at discharge     Chronic anemia of renal disease, stable  - Hemoglobin 10.2, MCV 76.2 on admission  -  Baseline 8-8.7      Insulin  Dependent Type II Diabetes  - A1C: 6.3  - PTA: Triseba 50 U QHS, Aspart 10 U with breakfast, 8 U with lunch, 10 U with dinner   - PTA: Mounjaro  7.5 q 7 days   > Restart PTA Glargine 50 U QHS, Aspart 12U with breakfast, 12U with lunch, 12U with dinner  > Hold PTA Mounjaro       Diet: Renal  VTE ppx: Heparin   CODE: Full   DISPO: Admit to medicine.      E    Voalte is the preferred method of communication for Internal Medicine. Please search voalte directory for Med Private First Call assigned team name to find the correct covering doctor or nurse practitioner 24/7. You can find the assigned team in the information bar to the left in the patient chart under service.  Personal Voaltes and pagers are not answered at all hours.      High medical decision making due to the following:  1 or more chronic illness with severe exacerbation  drug therapy requiring intensive monitoring for toxicityIV CTX      Assessment & Plan  Metabolic acidosis    Diabetes mellitus (CMS-HCC)    AKI (acute kidney injury)    Urinary tract infection    High anion gap metabolic acidosis    Severe malnutrition       Present on Admission:   Metabolic acidosis   AKI (acute kidney injury)   Diabetes mellitus (CMS-HCC)   Severe malnutrition     _______________________________________________________________________    Subjective     No acute events overnight.  Continues to improve.  She is feeling well and eating breakfast this morning and feels improved after dialysis yesterday.  We discussed the plan moving forward.  She is pleased that her lower extremity edema has improved      Malnutrition Details:  Malnutrition present  ICD-10 code E43: Acute illness/Severe malnutrition    Weight loss: Greater than 5% x 1 month, Energy intake: 50% or less of estimated energy requirement for 5 days or more                                     Malnutrition Interventions: Assessed nutritional status & provided recommendations     Active Wounds                            Wounds Puncture Right;Lower Abdomen (Active)   04/23/24 0920   Wound Type: Puncture   Orientation: Right;Lower   Location: Abdomen   Wound Location Comments:    Initial Wound Site Closure:    Initial Dressing Placed: Transparent (i.e. Tegaderm);Gauze   Initial Cycle:    Initial Suction Setting (mmHg):    Pressure Injury Stages:    Pressure Injury Present Within 24 Hours of Hospital Admission:    If This Pressure Injury Is Suspected to Be Device Related, Please Select the Device::    Is the Wound Open or Closed:    Wound Assessment Dressing not removed for assessment 04/27/24 0400   Peri-wound Assessment Dry;Intact 04/27/24 0400   Wound Drainage Amount None 04/27/24 0400   Wound Dressing Status Intact 04/27/24 0400   Number of days: 4       Wounds Puncture Anterior;Right Neck (Active)   04/25/24 1546   Wound Type: Puncture   Orientation: Anterior;Right  Location: Neck   Wound Location Comments: HD Line Tunnel Site   Initial Wound Site Closure:    Initial Dressing Placed:    Initial Cycle:    Initial Suction Setting (mmHg):    Pressure Injury Stages:    Pressure Injury Present Within 24 Hours of Hospital Admission:    If This Pressure Injury Is Suspected to Be Device Related, Please Select the Device::    Is the Wound Open or Closed:    Wound Assessment Dressing not removed for assessment 04/27/24 0400   Peri-wound Assessment Dry;Intact 04/27/24 0400   Wound Drainage Amount None 04/27/24 0400   Wound Dressing Status Intact 04/27/24 0400   Wound Care Dressing changed or new application 04/25/24 1600   Wound Dressing and/or Treatment Primapore 04/27/24 0400   Dressing Change Due 04/26/24 04/27/24 0400   Number of days: 2                    Medications  Scheduled Meds:heparin  (porcine) PF syringe 5,000 Units, 5,000 Units, Subcutaneous, Q8H  influenza (=>65 Yo) trivalent HIGH DOSE (FLUZONE) 180 mcg/0.5 mL 2025-26 PF syringe 0.5 mL, 0.5 mL, Intramuscular, ONCE  insulin  aspart (U-100) (NOVOLOG  FLEXPEN U-100 INSULIN ) injection PEN 0-6 Units, 0-6 Units, Subcutaneous, ACHS (22)  insulin  aspart (U-100) (NOVOLOG  FLEXPEN U-100 INSULIN ) injection PEN 12 Units, 12 Units, Subcutaneous, QDAY w/breakfast  insulin  aspart (U-100) (NOVOLOG  FLEXPEN U-100 INSULIN ) injection PEN 12 Units, 12 Units, Subcutaneous, QDAY w/dinner  insulin  aspart (U-100) (NOVOLOG  FLEXPEN U-100 INSULIN ) injection PEN 12 Units, 12 Units, Subcutaneous, QDAY w/lunch  insulin  glargine (LANTUS  SOLOSTAR U-100 INSULIN ) injection PEN 50 Units, 50 Units, Subcutaneous, QHS(22)  rosuvastatin  (CRESTOR ) tablet 10 mg, 10 mg, Oral, QDAY  tacrolimus  (PROGRAF ) capsule 0.5 mg, 0.5 mg, Oral, BID    Continuous Infusions:      PRN and Respiratory Meds:acetaminophen  Q4H PRN, calcium  carbonate Q4H PRN, dextrose  50% PRN, melatonin QHS PRN, ondansetron  Q6H PRN **OR** ondansetron  Q6H PRN, oxyBUTYnin  chloride BID PRN, polyethylene glycol 3350  QDAY PRN, sennosides-docusate sodium  QDAY PRN                Objective                        Vital Signs: Last Filed                 Vital Signs: 24 Hour Range   BP: 110/76 (09/27 0400)  Temp: 36.6 ?C (97.9 ?F) (09/27 0400)  Pulse: 72 (09/27 0400)  Respirations: 18 PER MINUTE (09/27 0400)  SpO2: 98 % (09/27 0400)  O2 Device: None (Room air) (09/27 0400) BP: (93-147)/(66-86)   Temp:  [36.3 ?C (97.3 ?F)-36.7 ?C (98.1 ?F)]   Pulse:  [71-85]   Respirations:  [9 PER MINUTE-19 PER MINUTE]   SpO2:  [98 %-100 %]   O2 Device: None (Room air)   Intensity Pain Scale (Self Report): 3 (04/26/24 2000) Vitals:    04/21/24 1846 04/26/24 1641 04/26/24 1900   Weight: 84.3 kg (185 lb 12.8 oz) 86 kg (189 lb 9.5 oz) 85 kg (187 lb 6.3 oz)       Intake/Output Summary:  (Last 24 hours)    Intake/Output Summary (Last 24 hours) at 04/27/2024 0806  Last data filed at 04/27/2024 0000  Gross per 24 hour   Intake 935 ml   Output 2910 ml   Net -1975 ml     Stool Occurrence: 1        Physical  Exam  GEN: Awake, alert, NAD, appears well today   CV: S1S2 RRR, no murmurs  RESP: No respiratory distress, clear to auscultation, no wheezes or rhonchi  ABD: Soft, nontender, non distended, good bowel sounds  EXT: No LE edema  GU: Foley in place, clear yellow urine    Lab Review  24-hour labs:    Results for orders placed or performed during the hospital encounter of 04/21/24 (from the past 24 hours)   POC GLUCOSE    Collection Time: 04/26/24  8:13 AM   Result Value Ref Range    Glucose, POC 190 (H) 70 - 100 mg/dL   POC GLUCOSE    Collection Time: 04/26/24 12:59 PM   Result Value Ref Range    Glucose, POC 271 (H) 70 - 100 mg/dL   POC GLUCOSE    Collection Time: 04/26/24  9:10 PM   Result Value Ref Range    Glucose, POC 230 (H) 70 - 100 mg/dL   CBC AND DIFF Collection Time: 04/27/24  4:33 AM   Result Value Ref Range    White Blood Cells 7.00 4.50 - 11.00 10*3/uL    Red Blood Cells 3.93 (L) 4.00 - 5.00 10*6/uL    Hemoglobin 10.4 (L) 12.0 - 15.0 g/dL    Hematocrit 69.4 (L) 36.0 - 45.0 %    MCV 77.6 (L) 80.0 - 100.0 fL    MCH 26.4 26.0 - 34.0 pg    MCHC 34.0 32.0 - 36.0 g/dL    RDW 82.5 (H) 88.9 - 15.0 %    Platelet Count 244 150 - 400 10*3/uL    MPV 8.0 7.0 - 11.0 fL    MDW (Monocyte Distribution Width) 16.9 <=20.6   COMPREHENSIVE METABOLIC PANEL    Collection Time: 04/27/24  4:33 AM   Result Value Ref Range    Sodium 138 137 - 147 mmol/L    Potassium 3.2 (L) 3.5 - 5.1 mmol/L    Chloride 101 98 - 110 mmol/L    Glucose 234 (H) 70 - 100 mg/dL    Blood Urea Nitrogen 41 (H) 7 - 25 mg/dL    Creatinine 4.27 (H) 0.40 - 1.00 mg/dL    Calcium  8.4 (L) 8.5 - 10.6 mg/dL    Total Protein 7.2 6.0 - 8.0 g/dL    Total Bilirubin 0.3 0.2 - 1.3 mg/dL    Albumin 3.5 3.5 - 5.0 g/dL    Alk Phosphatase 66 25 - 110 U/L    AST 18 7 - 40 U/L    ALT 15 7 - 56 U/L    CO2 25 21 - 30 mmol/L    Anion Gap 12 3 - 12    Glomerular Filtration Rate (GFR) 8 (L) >60 mL/min   MAGNESIUM     Collection Time: 04/27/24  4:33 AM   Result Value Ref Range    Magnesium  1.9 1.6 - 2.6 mg/dL   PHOSPHORUS    Collection Time: 04/27/24  4:33 AM   Result Value Ref Range    Phosphorus 3.7 2.0 - 4.5 mg/dL   POC GLUCOSE    Collection Time: 04/27/24  6:32 AM   Result Value Ref Range    Glucose, POC 191 (H) 70 - 100 mg/dL       Point of Care Testing  (Last 24 hours)  Glucose: (!) 234 (04/27/24 0433)  POC Glucose (Download): (!) 191 (04/27/24 9367)    Radiology and other Diagnostics Review:    Last Images past 24 hours:

## 2024-04-27 NOTE — Unmapped
 Patient/family declined:  Bed/chair alarm, W/in arms reach during toileting/showering, and Ambulation.    Patient/family educated on importance of intervention to their safety/quality of care. Patient/family continues to decline care.    Reason Why Patient/Family declined:    States she can do it herself.    Individualized safety and/or care plan implemented. If additional safety measures implemented, please list them.     Escalated to:  Unit coordinator/charge nurse.

## 2024-04-27 NOTE — Progress Notes
 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 On PD Jan 2019  DDRT  KPDI 92%, cPRA 0%  Physical Crossmatch B cell negative T cell negative  CMV D -/ R -  Induction: Thymo  Maintenance IS: Dual  IS   PD not removed with surgery   Post-op Complications:  - AKI on 04/13/2023, admitted with sCr of 5.4. volume depletion, hydronephrosis/hydroureter.  PCNU removed 09/22/2023, dilation of transplant ureteral stricture and placement of ureteral stent - Requiring frequent exchanges.   -- PET scan on 06/20/2023: Findings compatible with reactive lymph nodes with less consideration for pathological process or PTLD   - Kidney transplant biopsy 10/21/2020: no evidence of rejection, +tubular injury, mild IFTA (~15-20%) and some Ii-IFTA, no significant tubulitis or ptc, no glomerulitis, arteries a little on the thick side, mild to moderate arteriosclerosis in large vessels, arterioles ok, C4d negative, glomeruli look ok, IF panel negative;     Subjective:  Denies any dyspnea, nausea, vomit, abdominal pain, diarrhea.    REVIEW OF SYSTEMS: Comprehensive 14-point ROS reviewed  Positives noted in HPI otherwise negative.    Allergies:   Allergies[1]    Past Medical History:  Past Medical History:    Abnormal biopsy of kidney    Anemia    Chronic kidney disease    Coronary artery disease due to lipid rich plaque    DM (diabetes mellitus), type 2 (CMS-HCC)    ESRD (end stage renal disease) (CMS-HCC)    Gastroparesis    GERD (gastroesophageal reflux disease)    H/O kidney transplant    HLD (hyperlipidemia)    Hypertension    Kidney failure    Kidney stones    MGUS (monoclonal gammopathy of unknown significance)    Obesity    Proteinuria    PUD (peptic ulcer disease)    PVD (peripheral vascular disease)    Urinary tract infection       Social History:  Social History     Socioeconomic History    Marital status: Married     Spouse name: Roger    Number of children: 2   Tobacco Use    Smoking status: Never    Smokeless tobacco: Never   Vaping Use    Vaping status: Never Used   Substance and Sexual Activity    Alcohol use: Not Currently    Drug use: Never    Sexual activity: Not Currently     Partners: Male     Birth control/protection: None   Social History Narrative    [place] in [place]. Works at [employer], puts labels on bottles.       Surgical History:  Surgical History:   Procedure Laterality Date    ANGIOGRAPHY CORONARY ARTERY WITH LEFT HEART CATHETERIZATION N/A 07/03/2020    Performed by Gupta, Kamal, MD at [facility] CATH LAB    POSSIBLE PERCUTANEOUS CORONARY STENT PLACEMENT WITH ANGIOPLASTY N/A 07/03/2020    Performed by Gupta, Kamal, MD at [facility] CATH LAB    ALLOTRANSPLANTATION KIDNEY FROM NON LIVING DONOR WITHOUT RECIPIENT NEPHRECTOMY N/A 07/15/2020    Performed by Terrie Sieving, MD at [facility] OR    REMOVAL TUNNELED INTRAPERITONEAL CATHETER Bilateral 08/24/2020    Performed by Terrie Sieving, MD at [facility] OR    CYSTOURETHROSCOPY WITH REMOVAL  FOREIGN BODY/ CALCULUS/ STENT FROM URETHRA/ BLADDER - SIMPLE Right 08/24/2020    Performed by Carolynn Ruther ORN, MD at [facility] OR    ESOPHAGOGASTRODUODENOSCOPY WITH BIOPSY - FLEXIBLE N/A 04/17/2023    Performed by Chipper Neas, MD at [facility] ENDO    COLONOSCOPY WITH BIOPSY - FLEXIBLE N/A 04/17/2023    Performed by Chipper Neas, MD at [facility] ENDO    ESOPHAGOGASTRODUODENOSCOPY WITH SPECIMEN COLLECTION BY BRUSHING/ WASHING N/A 07/21/2023    Performed by Arlan Nancyann PARAS, MD at [facility] ENDO    ANTEGRADE UROGRAPHY Right 09/22/2023    Performed by Erskin Alm LABOR, MD at [facility] OR    RETROGRADE UROGRAPHY WITH/ WITHOUT KUB Right 09/22/2023    Performed by Erskin Alm LABOR, MD at [facility] OR    REMOVAL, NEPHROSTOMY TUBE, WITH FLUOROSCOPIC GUIDANCE Right 09/22/2023    Performed by Erskin Alm LABOR, MD at [facility] OR CYSTOURETHROSCOPY WITH URETEROSCOPY WITH TREATMENT URETERAL STRICTURE Right 09/22/2023    Performed by Erskin Alm LABOR, MD at [facility] OR    CYSTOURETHROSCOPY WITH URETEROSCOPY AND/ OR PYELOSCOPY - DIAGNOSTIC Right 11/17/2023    Performed by Erskin Alm LABOR, MD at [facility] OR    RETROGRADE UROGRAPHY WITH/ WITHOUT KUB Right 11/17/2023    Performed by Erskin Alm LABOR, MD at [facility] OR    CYSTOURETHROSCOPY WITH REMOVAL FOREIGN BODY/ CALCULUS/ STENT FROM URETHRA/ BLADDER - SIMPLE Right 11/17/2023    Performed by Erskin Alm LABOR, MD at [facility] OR    CATHETER IMPLANT/REVISION      PD cath    CESAREAN SECTION  1991    HX CESAREAN SECTION  1991    HX HYSTERECTOMY  2000    still has ovaries    HX LITHOTRIPSY      HX TUBAL LIGATION  1995         Family History:  Family History   Problem Relation Name Age of Onset    Diabetes Mother Mom     Hypertension Mother Mom     Diabetes Father Lukemia     Hypertension Father Lukemia     Cancer-Hematologic Father Lukemia     Cancer Father Lukemia     Diabetes Sister Macario     Migraines Sister Macario     Cancer Sister Macario         Endometrial       Current Medications:  Current Medications[2]    Physical Exam:  BP 114/75  - Pulse 73  - Temp 36.5 ?C (97.7 ?F)  - Ht 172.7 cm (5' 8)  - Wt 85 kg (187 lb 6.3 oz)  - LMP  (LMP Unknown)  - SpO2 100%  - BMI 28.49 kg/m?     Intake/Output Summary (Last 24 hours) at 04/27/2024 1538  Last data filed at 04/27/2024 1258  Gross per 24 hour   Intake 1155 ml   Output 2660 ml   Net -1505 ml     General: In NAD; A&Ox3  Skin: Warm, dry, no signs of rash   Neck: Supple   CV: Regular, regular rate, normal S1/S2, no murmurs  Lungs: CTA bilaterally in posterior fields  Abd: Grossly normal without rebound, guarding, masses, or bruits   Ext: no edema  Neuro: No focal deficits   Psych: Affect appropriate    Laboratory studies:   CMP:      Latest Ref Rng & Units 04/27/2024     4:33 AM 04/26/2024     4:06 AM 04/25/2024     3:58 AM 04/24/2024  4:56 AM 04/23/2024     5:56 AM   CMP   Sodium 137 - 147 mmol/L 138  140  141  139  142    Potassium 3.5 - 5.1 mmol/L 3.2  3.3  3.4  3.1  3.3    Chloride 98 - 110 mmol/L 101  103  107  104  105    CO2 21 - 30 mmol/L 25  25  21  21  17     Anion Gap 3 - 12 12  12  13  14  20     Blood Urea Nitrogen 7 - 25 mg/dL 41  78  76  84  78    Creatinine 0.40 - 1.00 mg/dL 4.27  1.66  0.95  0.08  10.43    Glucose 70 - 100 mg/dL 765  703  864  696  855    Calcium  8.5 - 10.6 mg/dL 8.4  8.7  8.2  8.7  9.1    Total Protein 6.0 - 8.0 g/dL 7.2  7.2  6.9  7.2  7.3    Albumin 3.5 - 5.0 g/dL 3.5  3.6  3.4  3.5  3.4    Alk Phosphatase 25 - 110 U/L 66  75  60  63  59    ALT (SGPT) 7 - 56 U/L 15  16  12  14  8     AST 7 - 40 U/L 18  20  18  28  11     Total Bilirubin 0.2 - 1.3 mg/dL 0.3  0.3  0.3  0.3  0.3    GFR >60 mL/min 8  5  5  4  4       Tacrolimus  LC-MS/MS (no units)   Date Value   08/03/2021 4.3 (L)   07/10/2021 7.3   06/26/2021 7.9   06/12/2021 9.3   05/29/2021 8.3     Tacrolimus  Immunoassay (ng/mL)   Date Value   04/27/2024 5.1   04/26/2024 6.6   04/25/2024 8.8   04/24/2024 9.3   04/23/2024 12.2   04/25/2023 7.3   04/21/2023 5.1   04/20/2023 6.1   04/19/2023 5.7   04/18/2023 4.9 (L)     Additional Chemistries / Infectious Labs:   Hemoglobin A1C (%)   Date Value   04/21/2024 6.3 (H)   01/19/2024 7.2 (H)   04/13/2023 11.1 (H)   10/30/2021 11.6 (H)   05/29/2021 10.8 (H)   ,   PTH Hormone   Date Value   01/19/2024 176.2 pg/mL (H)   10/03/2023 116.6 pg/mL (H)   07/24/2023 104.0 pg/mL (H)   04/25/2023 151.5 PG/ML (H)   10/30/2021 195.9 PG/ML (H)   10/14/2020 96.1 PG/ML (H)     Vitamin D (25-OH)Total (NG/ML)   Date Value   04/25/2023 35.7   10/30/2021 33.8   10/14/2020 28.4 (L)     Vitamin D  (25-OH) Total (ng/mL)   Date Value   01/19/2024 36   10/03/2023 40   , No results found for: LIPASE, AMY,   BK Virus Plasma Quant (no units)   Date Value   04/14/2023 BK Virus Not Detected   10/30/2021     NOT DETECTED  Reference range: NOT DETECTED  Unit: IU/mL  .  Assay Range: 33 IU/mL to 3.30E+08 IU/mL  .  One IU is equal to 0.33 copies of BKV.  .  The limit of quantitation (LOQ) is 33 IU/mL. BK virus DNA detected  below the LOQ  will be reported as Detected:<33  IU/mL.  .  This test was developed and its performance characteristics  determined by  [facility]. It has not been cleared or approved by the U.[place].  Food and Drug  Administration. Results should be used in conjunction with clinical  findings,  and should not form the sole basis for a diagnosis or treatment  decision.  ____________________________________________________________  Testing Performed At:  [facility]  [address]  [place], [place] [address]  Laboratory Director: Lucious Betters Ph.D., BCLD (ABB)  CLIA#: [id number]  Phone: [phone]     10/02/2021     NOT DETECTED  Reference range: NOT DETECTED  Unit: IU/mL  TESTING PERFORMED AT LOW VOLUME ON PLASMA SPECIMEN FOR BKV qPCR, MAY  AFFECT  RESULTS.  SABRA  Assay Range: 33 IU/mL to 3.30E+08 IU/mL  .  One IU is equal to 0.33 copies of BKV.  .  The limit of quantitation (LOQ) is 33 IU/mL. BK virus DNA detected  below the LOQ  will be reported as Detected:<33 IU/mL.  .  This test was developed and its performance characteristics  determined by  [facility]. It has not been cleared or approved by the U.S.  Food and Drug  Administration. Results should be used in conjunction with clinical  findings,  and should not form the sole basis for a diagnosis or treatment  decision.  ____________________________________________________________  Testing Performed At:  [facility]  [address]  [place], [place] [address]  Laboratory Director: Lucious Betters Ph.D., BCLD (ABB)  CLIA#: [id number]  Phone: [phone]       BK Virus Plasma (no units)   Date Value   04/19/2024 Not Detected   03/29/2024 Not Detected   03/08/2024 Not Detected   ,   CMV DNA Quant PCR ([IU]/mL)   Date Value   04/14/2023 CMV DNA NOT DETECTED   12/04/2021 CMV DNA NOT DETECTED   10/30/2021 CMV DNA NOT DETECTED   , and   EBV DNA, Quant. (no units)   Date Value   04/14/2023 EBV DNA Not Detected     Glomerulitis / Autoimmune Labs:   Lactate Dehydrogenase (U/L)   Date Value   04/20/2023 175   11/28/2017 157   ,   Complemnt C3 (MG/DL)   Date Value   90/86/7975 118.0     Complemnt C4 (MG/DL)   Date Value   90/86/7975 30.0   , and No results found for: ANCASCN, MYELOPERAB, MYELOPEROX  CBC with Diff:      Latest Ref Rng & Units 04/27/2024     4:33 AM 04/26/2024     4:06 AM   CBC with Diff   WBC 4.50 - 11.00 10*3/uL 7.00  5.50    RBC 4.00 - 5.00 10*6/uL 3.93  3.83    Hemoglobin 12.0 - 15.0 g/dL 89.5  89.8    Hematocrit 36.0 - 45.0 % 30.5  29.9    MCV 80.0 - 100.0 fL 77.6  78.1    MCH 26.0 - 34.0 pg 26.4  26.5    MCHC 32.0 - 36.0 g/dL 65.9  66.0    RDW 88.9 - 15.0 % 17.4  17.6    Platelet Count 150 - 400 10*3/uL 244  267    MPV 7.0 - 11.0 fL 8.0  8.4    Neurtrophils 41.0 - 77.0 %  66.5    Absolute Neutrophils 1.8 - 7.0 10*3/uL 5.3  3.60    Lymphocytes 24 - 44 % 9     Absolute Lymph Count  1.00 - 4.80 10*3/uL  0.90    Monocytes 4 - 12 % 6     Absolute Monocyte Count 0.00 - 0.80 10*3/uL  0.60    Eosinophils 0.0 - 5.0 %  3.9    Absolute Eosinophil Count 0.00 - 0.45 10*3/uL  0.20    Basophils 0.0 - 2.0 %  0.8      Hematology:   Lab Results   Component Value Date/Time    IRON  20 (L) 03/08/2024 10:33 AM    TIBC 201 (L) 03/08/2024 10:33 AM    PSAT 10 (L) 03/08/2024 10:33 AM    FERRITIN 398 (H) 03/08/2024 10:33 AM    FERRITIN 327 (H) 01/19/2024 12:17 PM     Urinalysis:  Lab Results   Component Value Date/Time    UCOLOR Red 04/21/2024 08:52 PM    TURBID 2+ (A) 04/21/2024 08:52 PM    USPGR 1.015 04/21/2024 08:52 PM    UPH 6.0 04/21/2024 08:52 PM    UPROTEIN 3+ (A) 04/21/2024 08:52 PM    UAGLU 3+ (A) 04/21/2024 08:52 PM    UKET 1+ (A) 04/21/2024 08:52 PM    UBILE Negative 04/21/2024 08:52 PM    UBLD 2+ (A) 04/21/2024 08:52 PM    UROB Normal 04/21/2024 08:52 PM     Protein/CR ratio (no units)   Date Value   04/19/2024 2.13 (H)   03/29/2024 0.72 (H) 03/08/2024 1.12 (H)   01/19/2024 0.76 (H)   12/22/2023 0.81 (H)   04/13/2023 1.7 (H)   01/08/2022 0.7 (H)   12/04/2021 0.7 (H)   10/30/2021 0.7 (H)   10/02/2021 0.5 (H)     Wt Readings from Last 10 Encounters:   04/27/24 85 kg (187 lb 6.3 oz)   03/18/24 93.5 kg (206 lb 3.2 oz)   02/13/24 94.3 kg (208 lb)   02/09/24 96.2 kg (212 lb)   11/17/23 95.9 kg (211 lb 6.7 oz)   11/01/23 98.1 kg (216 lb 3.2 oz)   10/13/23 96.2 kg (212 lb)   10/03/23 97.5 kg (215 lb)   09/22/23 96.2 kg (212 lb 1.3 oz)   09/21/23 97.1 kg (214 lb)     SURGICAL PATHOLOGY: [id number]  Order: [id number]   Collected 04/23/2024 09:11       Status: Final result    Test Result Released: Yes (not seen)    0 Result Notes      Component  Ref Range & Units (hover)    Clinical Information    ESRD s/p renal transplant in 2021 now with AKI   Final Diagnosis   A, B. Right Kidney (transplant kidney core biopsy):     SEVERE INTERSTITIAL FIBROSIS AND TUBULAR ATROPHY.  ACUTE TUBULAR INJURY.  SEE COMMENT AND DESCRIPTION.     COMMENT: There biopsy shows severe interstitial fibrosis with accompanying i-IFTA. The few non-atrophic tubules show flattening and focal rupture. The findings do not support a diagnosis of either T cell or antibody mediated rejection. There is no evidence of monoclonal immunoglobulin deposition. Preliminary results were communicated to Dr. Mabel Kays on 04/24/24.         Assessment and Plan:  Ms. Gulledge, 58 y.o. 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 cPRA 0%, KDPI 92%. Admitted on 04/21/24 for AKI.    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   #) Prior nephrolithiasis in native kidneys   #) Moderate uremic symptoms   - Baseline sCr: 3-4.00 since 2025  - Peak creatinine during this admission - 11.95  - I/O: 1652/1735 cc urine = -1383  - Wt: 84.3, 85 kg   - K acceptable  - CO2 acceptable  - Mg acceptable  - Volume status: euvolemic   - UA: 3+ protein, 2+ blood, packed WBC and RBC, 3+ leukocytes and packed bacteria with WBC clumps   - Renal transplant US  04/21/24: minimal hydro, echogenic areas in R collecting system with gas, infection vs calculi, vs recent catheterization.  - Biopsy 04/23/24 - marked fibrosis with severe IFTA and ATN  - After discussion with patient, we will begin HD due to severe graft dysfunction with marked IFTA  - HD session #2 today with no UF  - Will required OP dialysis clinic for chronic dialysis with monitoring due to AKI.    #) ID   #) UTI  - CMV D-/R-  - BK and CMV negative on 04/19/24  - On CTX for UTI   - Urine culture with strep agalactiae 04/21/24    #) BPs:   - SBP 110-110  - Not on therapy     #) Diabetes  - Per primary  - Last A1c 6.3 on 04/21/24    #) Anemia  - Hgb 10.4 at goal, WBC 7.00 and PLT 244    #) CKD-MBD  - PTH 176 on 01/19/24  - Vitamin D  36 on 01/19/24  - Ca acceptable  - PO4 acceptable      #) MGUS, IgG kappa  - Ongoing follow-up with hematology  - FLC ratio 1.95 - 01/19/24  - SPEP with spike in beta or gamma region 01/19/24     #) CAD s/p cath 07/03/20: superior branch LAD 80% disease, RCA 40-50% mid RCA stenosis at/around origin RV marginal branch; RV marginal branch also has ~80% stenosis; guide directed medical therapy, risk factor optimization  -On statin    Sharrell Bonner Fortune, MD  [facility]  Service Pager: [phone]         [1] No Known Allergies  [2]   Current Facility-Administered Medications:     acetaminophen  (TYLENOL ) tablet 650 mg, 650 mg, Oral, Q4H PRN, Funaro, Danielle, DO, 650 mg at 04/26/24 0416    calcium  carbonate (TUMS) chew tablet 500 mg, 500 mg, Oral, Q4H PRN, Wiemholt, Edward B, DO, 500 mg at 04/22/24 1018    dextrose  50% (D50) syringe 25-50 mL, 12.5-25 g, Intravenous, PRN, Funaro, Danielle, DO    heparin  (porcine) PF syringe 5,000 Units, 5,000 Units, Subcutaneous, Q8H, Wiemholt, Edward B, DO, 5,000 Units at 04/27/24 9367    influenza (=>65 Yo) trivalent HIGH DOSE (FLUZONE) 180 mcg/0.5 mL 2025-26 PF syringe 0.5 mL, 0.5 mL, Intramuscular, ONCE, Moudgal, Donzell SAILOR, MD    insulin  aspart (U-100) (NOVOLOG  FLEXPEN U-100 INSULIN ) injection PEN 0-6 Units, 0-6 Units, Subcutaneous, ACHS (22), Funaro, Danielle, DO, 1 Units at 04/27/24 1023    insulin  aspart (U-100) (NOVOLOG  FLEXPEN U-100 INSULIN ) injection PEN 12 Units, 12 Units, Subcutaneous, QDAY w/breakfast, Wiemholt, Edward B, DO, 12 Units  at 04/27/24 9178    insulin  aspart (U-100) (NOVOLOG  FLEXPEN U-100 INSULIN ) injection PEN 12 Units, 12 Units, Subcutaneous, QDAY w/dinner, Azalee Dallas NOVAK, DO, 12 Units at 04/24/24 1812    insulin  aspart (U-100) (NOVOLOG  FLEXPEN U-100 INSULIN ) injection PEN 12 Units, 12 Units, Subcutaneous, QDAY w/lunch, Wiemholt, Edward B, DO, 12 Units at 04/27/24 1145    insulin  glargine (LANTUS  SOLOSTAR U-100 INSULIN ) injection PEN 50 Units, 50 Units, Subcutaneous, QHS(22), Azalee Dallas NOVAK, DO, 50 Units at 04/26/24 2104    melatonin tablet 5 mg, 5 mg, Oral, QHS PRN, Funaro, Danielle, DO    ondansetron  (ZOFRAN  ODT) rapid dissolve tablet 4 mg, 4 mg, Oral, Q6H PRN **OR** ondansetron  HCL (PF) (ZOFRAN  (PF)) injection 4 mg, 4 mg, Intravenous, Q6H PRN, Funaro, Danielle, DO, 4 mg at 04/27/24 1020    oxyBUTYnin  chloride (DITROPAN ) tablet 5 mg, 5 mg, Oral, BID PRN, Funaro, Danielle, DO    polyethylene glycol 3350  (MIRALAX ) packet 17 g, 1 packet, Oral, QDAY PRN, Funaro, Danielle, DO    rosuvastatin  (CRESTOR ) tablet 10 mg, 10 mg, Oral, QDAY, Funaro, Danielle, DO, 10 mg at 04/27/24 0816 sennosides-docusate sodium  (SENOKOT-S) tablet 1 tablet, 1 tablet, Oral, QDAY PRN, Funaro, Danielle, DO, 1 tablet at 04/24/24 0854    sodium chloride  0.9% IV bolus 100 mL, 100 mL, Intravenous, PRN, Ramos Castaneda, Sharrell, MD    sodium chloride  0.9% IV bolus 100 mL, 100 mL, Intravenous, PRN, Ramos Castaneda, Sharrell, MD    sodium chloride  0.9% IV bolus 300 mL, 300 mL, Intravenous, PRN, Ramos Castaneda, Sharrell, MD, Last Rate: 947.4 mL/hr at 04/27/24 1310, 300 mL at 04/27/24 1310    tacrolimus  (PROGRAF ) capsule 0.5 mg, 0.5 mg, Oral, BID, Deidra Mabel RAMAN, MD, 0.5 mg at 04/27/24 [phone]

## 2024-04-28 LAB — POC GLUCOSE
~~LOC~~ BKR POC GLUCOSE: 241 mg/dL — ABNORMAL HIGH (ref 70–100)
~~LOC~~ BKR POC GLUCOSE: 171 mg/dL — ABNORMAL HIGH (ref 70–100)
~~LOC~~ BKR POC GLUCOSE: 209 mg/dL — ABNORMAL HIGH (ref 70–100)
~~LOC~~ BKR POC GLUCOSE: 258 mg/dL — ABNORMAL HIGH (ref 70–100)

## 2024-04-28 MED ORDER — LOPERAMIDE 2 MG PO CAP
2 mg | ORAL | 0 refills | Status: DC | PRN
Start: 2024-04-28 — End: 2024-04-30
  Administered 2024-04-28 – 2024-04-29 (×2): 2 mg via ORAL

## 2024-04-28 NOTE — Unmapped
 Patient/family declined:  Bed/chair alarm, W/in arms reach during toileting/showering, and Ambulation.    Patient/family educated on importance of intervention to their safety/quality of care. Patient/family continues to decline care.    Reason Why Patient/Family declined:    States not necesary.    Individualized safety and/or care plan implemented. If additional safety measures implemented, please list them.     Escalated to:  Unit coordinator/charge nurse.

## 2024-04-28 NOTE — Progress Notes
 [facility]7 END OF SHIFT/ JHFRAT NOTE    Admission Date: 04/21/2024    Acute events, interventions, provider communication:     Patient Interventions and Education  Fall Risk/JHFRAT Interventions and Education: (Charting when applicable)  Elimination Interventions : N/A  Medications : Educate patient on medication side effects  Patient Care Equipment: Does not need assistance with patient care equipment when ambulating, Ensure environment is free of clutter and walkways are clear from tripping hazards, and Assess need for patient equipment and remove if not in use  Mobility: N/A  Cognition: N/A  Risk for Moderate/Major Injury: N/A    2. Restraints:  No

## 2024-04-29 ENCOUNTER — Inpatient Hospital Stay: Admit: 2024-04-29 | Discharge: 2024-04-29 | Payer: PRIVATE HEALTH INSURANCE

## 2024-04-29 LAB — MANUAL DIFF
~~LOC~~ BKR EOSINOPHILS - RELATIVE: 3 % (ref 0–5)
~~LOC~~ BKR LYMPHOCYTES - RELATIVE: 7 % — ABNORMAL LOW (ref 24–44)
~~LOC~~ BKR MONOCYTES - RELATIVE: 6 % (ref 4–12)
~~LOC~~ BKR MYELOCYTES - RELATIVE: 3 %
~~LOC~~ BKR NEUT+BANDS - ABSOLUTE: 7.5 10*3/uL — ABNORMAL HIGH (ref 1.8–7.0)
~~LOC~~ BKR NEUTROPHILS - RELATIVE: 81 % — ABNORMAL HIGH (ref 41–77)

## 2024-04-29 LAB — POC GLUCOSE: ~~LOC~~ BKR POC GLUCOSE: 245 mg/dL — ABNORMAL HIGH (ref 70–100)

## 2024-04-29 MED ORDER — POTASSIUM CHLORIDE 20 MEQ PO TBTQ
20 meq | Freq: Once | ORAL | 0 refills | Status: CP
Start: 2024-04-29 — End: ?
  Administered 2024-04-29: 23:00:00 20 meq via ORAL

## 2024-04-29 MED ORDER — SODIUM CHLORIDE 0.9% IV BOLUS
300 mL | INTRAVENOUS | 0 refills | Status: DC | PRN
Start: 2024-04-29 — End: 2024-04-30
  Administered 2024-04-29: 14:00:00 300 mL via INTRAVENOUS

## 2024-04-29 MED ORDER — SODIUM CITRATE 4 % (3 ML) IK SYRG
1-6 mL | Freq: Once | 0 refills | Status: CP
Start: 2024-04-29 — End: ?
  Administered 2024-04-29: 14:00:00 3.2 mL

## 2024-04-29 MED ORDER — SODIUM CHLORIDE 0.9% IV BOLUS
100 mL | INTRAVENOUS | 0 refills | Status: DC | PRN
Start: 2024-04-29 — End: 2024-04-30

## 2024-04-29 NOTE — Care Plan
 Problem: Glucose Management  Goal: Absence of hyperglycemia  Outcome: Goal Ongoing  Goal: Absence of Hypoglycemia  Outcome: Goal Ongoing  Goal: Glucose level within specified parameters  Outcome: Goal Ongoing     Problem: Moderate Fall Risk  Goal: Moderate Fall Risk  Outcome: Goal Ongoing     Problem: High Fall Risk  Goal: High Fall Risk  Outcome: Goal Ongoing   Float Pool END OF SHIFT/ JHFRAT NOTE    Admission Date: 04/21/2024    Acute events, interventions, provider communication: BLADDER SCANNING     Patient Interventions and Education  Fall Risk/JHFRAT Interventions and Education: (Charting when applicable)  Elimination Interventions : N/A  Medications : N/A  Patient Care Equipment: Ensure environment is free of clutter and walkways are clear from tripping hazards and Assess need for patient equipment and remove if not in use  Mobility: N/A  Cognition: N/A  Risk for Moderate/Major Injury: Active Anticoagulation    2. Restraints:  No     Restraint Goal: Patient will be free from injury while physically restrained.  See Docflowsheet for restraint documentation, interventions, education, etc.    Intake and Output:       Intake/Output Summary (Last 24 hours) at 04/29/2024 1522  Last data filed at 04/29/2024 1228  Gross per 24 hour   Intake 1560 ml   Output 2050 ml   Net -490 ml              Last Bowel Movement Date: 04/29/24

## 2024-04-29 NOTE — Progress Notes
 Refused straight cath, would like to bladder train more.

## 2024-04-29 NOTE — Progress Notes
 Pt has not voided since they removed foley catheter yesterday at 0900, (per day nurse, she removed foley a little after 0900 yesterday), bladder scan at 2135 was 372 ml, pt requested more time, she attempted to void unsuccessful, she said she will not let us  straight cath yet, educated. At 0015, pt had not voided, she attempted again unsuccessful, pt requested more time, states she feels like if she gets more time she will void,  , she is also drinking water , states she hates being straight cath because it is too painful, educated again, and notified on call provider, Valiko, B.The provider states the pt can take time if she thinks she can void and to straight cath (threshold) is 350 ml.

## 2024-04-29 NOTE — Progress Notes
 General Progress Note    Name: Jacqueline Shields        MRN: [id number]          DOB: 1966/07/31            Age: 58 y.o.  Admission Date: 04/21/2024       LOS: 8 days    Date of Service: 04/29/2024    Assessment/Plan:      Jacqueline Shields is a 58 y.o. 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 Renal transplant team recommending initiation of HD. IR HD line ordered on 9/25. HD to initiate over the weekend and be set up outpatient. Treated with IV CTX for UTI. 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  > Initiation HD on 9/26, she prefers HD outpatient on M/W/F at [facility] in [place]. Joe.    Urinary tract infection   Nausea/ Vomiting, resolved   Nonobstructive nephrolithiasis   Severe hydronephrosis of R transplant kidney, chronic   Hydroureter of R transplant kidney , chronic    - Recent renal ultrasound 03/15/2024 showed mild hydroureteronephrosis, decreased since September 2024, progressive decrease in size and cortical thickness of transplant kidney.   CT abdomen/ pelvis: 1.8 cm cavitary lesion in right middle lobe with slightly improved aeration. Interval development of mild left sided hydronephrosis. Tiny nonobstructive calculi within lower pole calices of right kidney. Redemonstration of right pelvic renal transplant with severe hydronephrosis with associated hydroureter. Moderately distended urinary bladder with wall thickening.  - UA: 2+ turbidity, 3+ glucose, 1+ ketones, 3+ protein, 2+ blood, 3+ leukocytes, packed WBC, packed RBC, packed bacteria  - s/p LR at 125 mL/ hour   - completed course of  IV Ceftriaxone  for strep agalactiae   Plan  > removed 9/28 now voiding      Coronary artery disease  Dyslipidemia  - LHC 2021: 40% to 50% percent mid right coronary artery disease.Small branch vessel 80% disease with involving the superior branch of medium caliber diagonal vessel.  > Continue Rosuvastatin  10 mg daily   > HOLD PTA ASA 81 mg daily for renal biopsy and per Neph recs, likely resume at discharge     Chronic anemia of renal disease, stable  - Hemoglobin 10.2, MCV 76.2 on admission  - Baseline 8-8.7      Insulin  Dependent Type II Diabetes  - A1C: 6.3  - PTA: Triseba 50 U QHS, Aspart 10 U with breakfast, 8 U with lunch, 10 U with dinner   - PTA: Mounjaro  7.5 q 7 days   >  Glargine 50 U QHS, Aspart 12U with breakfast, 12U with lunch, 12U with dinner  > Hold PTA Mounjaro       Diet: Renal  VTE ppx: Heparin   CODE: Full   DISPO: Admit to medicine.          Voalte is the preferred method of communication for Internal Medicine. Please search voalte directory for Med Private First Call assigned team name to find the correct covering doctor or nurse practitioner 24/7. You can find the assigned team in the information bar to the left in the patient chart under service.  Personal Voaltes and pagers are not answered at all hours.      Moderate medical decision making due to the following:  1 or more chronic illnesses with exacerbation, progression, or side effects of treatment, 2 or more stable chronic illnesses, and 1 acute illness with systemic symptoms  Review of notes outside of my specialty, Review of each unique test, and Ordering of each unique test        Present on Admission:   Metabolic acidosis   AKI (acute kidney injury)   Diabetes mellitus (CMS-HCC)   Severe malnutrition     _______________________________________________________________________    Subjective     No acute events overnight.  Continues to improve.  She is feeling well and will go for dialysis tomorrow we discussed we plan moving forward and need to set up outpatient hemodialysis.  Was reporting some diarrhea this morning but denies any abdominal pain, fever, chills    Other ROS negative     Malnutrition Details:  Malnutrition present  ICD-10 code E43: Acute illness/Severe malnutrition    Weight loss: Greater than 5% x 1 month, Energy intake: 50% or less of estimated energy requirement for 5 days or more                                     Malnutrition Interventions: Assessed nutritional status & provided recommendations     Active Wounds                            Wounds Puncture Right;Lower Abdomen (Active)   04/23/24 0920   Wound Type: Puncture   Orientation: Right;Lower   Location: Abdomen   Wound Location Comments:    Initial Wound Site Closure:    Initial Dressing Placed: Transparent (i.e. Tegaderm);Gauze   Initial Cycle:    Initial Suction Setting (mmHg):    Pressure Injury Stages:    Pressure Injury Present Within 24 Hours of Hospital Admission:    If This Pressure Injury Is Suspected to Be Device Related, Please Select the Device::    Is the Wound Open or Closed:    Wound Assessment Dressing not removed for assessment 04/28/24 0900   Peri-wound Assessment Dry;Intact 04/28/24 0900   Wound Drainage Amount None 04/28/24 0900   Wound Dressing Status Intact 04/28/24 0900   Number of days: 6       Wounds Puncture Anterior;Right Neck (Active)   04/25/24 1546   Wound Type: Puncture   Orientation: Anterior;Right   Location: Neck   Wound Location Comments: HD Line Tunnel Site   Initial Wound Site Closure:    Initial Dressing Placed:    Initial Cycle:    Initial Suction Setting (mmHg):    Pressure Injury Stages:    Pressure Injury Present Within 24 Hours of Hospital Admission:  If This Pressure Injury Is Suspected to Be Device Related, Please Select the Device::    Is the Wound Open or Closed:    Wound Assessment Dressing not removed for assessment 04/28/24 0900   Peri-wound Assessment Dry;Intact 04/28/24 0900   Wound Drainage Amount None 04/28/24 0900   Wound Dressing Status Intact 04/28/24 0900   Wound Care Dressing changed or new application 04/25/24 1600   Wound Dressing and/or Treatment Primapore 04/28/24 0900   Dressing Change Due 04/26/24 04/27/24 0800   Number of days: 4                    Medications  Scheduled Meds:heparin  (porcine) PF syringe 5,000 Units, 5,000 Units, Subcutaneous, Q8H  influenza (=>65 Yo) trivalent HIGH DOSE (FLUZONE) 180 mcg/0.5 mL 2025-26 PF syringe 0.5 mL, 0.5 mL, Intramuscular, ONCE  insulin  aspart (U-100) (NOVOLOG  FLEXPEN U-100 INSULIN ) injection PEN 0-6 Units, 0-6 Units, Subcutaneous, ACHS (22)  insulin  aspart (U-100) (NOVOLOG  FLEXPEN U-100 INSULIN ) injection PEN 12 Units, 12 Units, Subcutaneous, QDAY w/breakfast  insulin  aspart (U-100) (NOVOLOG  FLEXPEN U-100 INSULIN ) injection PEN 12 Units, 12 Units, Subcutaneous, QDAY w/dinner  insulin  aspart (U-100) (NOVOLOG  FLEXPEN U-100 INSULIN ) injection PEN 12 Units, 12 Units, Subcutaneous, QDAY w/lunch  insulin  glargine (LANTUS  SOLOSTAR U-100 INSULIN ) injection PEN 50 Units, 50 Units, Subcutaneous, QHS(22)  rosuvastatin  (CRESTOR ) tablet 10 mg, 10 mg, Oral, QDAY  tacrolimus  (PROGRAF ) capsule 0.5 mg, 0.5 mg, Oral, BID    Continuous Infusions:      PRN and Respiratory Meds:acetaminophen  Q4H PRN, calcium  carbonate Q4H PRN, dextrose  50% PRN, loperamide  PRN, melatonin QHS PRN, ondansetron  Q6H PRN **OR** ondansetron  Q6H PRN, oxyBUTYnin  chloride BID PRN, polyethylene glycol 3350  QDAY PRN, sennosides-docusate sodium  QDAY PRN, sodium chloride  0.9% PRN, sodium chloride  0.9% PRN, sodium chloride  0.9% PRN                Objective                        Vital Signs: Last Filed                 Vital Signs: 24 Hour Range   BP: 108/78 (09/29 0845)  Temp: 36.4 ?C (97.5 ?F) (09/29 9189)  Pulse: 82 (09/29 0845)  Respirations: 18 PER MINUTE (09/29 0845)  SpO2: 98 % (09/29 0845)  O2 Device: None (Room air) (09/29 0845) BP: (105-129)/(71-90)   Temp:  [36.4 ?C (97.5 ?F)-36.7 ?C (98.1 ?F)]   Pulse:  [81-109]   Respirations:  [13 PER MINUTE-20 PER MINUTE]   SpO2:  [98 %-100 %]   O2 Device: None (Room air)     Vitals:    04/26/24 1900 04/27/24 1258 04/29/24 0810   Weight: 85 kg (187 lb 6.3 oz) 85 kg (187 lb 6.3 oz) 86.6 kg (190 lb 14.7 oz)       Intake/Output Summary:  (Last 24 hours)    Intake/Output Summary (Last 24 hours) at 04/29/2024 0902  Last data filed at 04/29/2024 0820  Gross per 24 hour   Intake 960 ml   Output 550 ml   Net 410 ml     Stool Occurrence: 0        Physical Exam  GEN: Awake, alert, NAD, appears well today   CV: S1S2 RRR, no murmurs  RESP: No respiratory distress, clear to auscultation, no wheezes or rhonchi  ABD: Soft, nontender, non distended, good bowel sounds  EXT: No LE edema  GU: Foley in place, clear  yellow urine    Lab Review  24-hour labs:    Results for orders placed or performed during the hospital encounter of 04/21/24 (from the past 24 hours)   POC GLUCOSE    Collection Time: 04/28/24 11:00 AM   Result Value Ref Range    Glucose, POC 209 (H) 70 - 100 mg/dL   POC GLUCOSE    Collection Time: 04/28/24  4:09 PM   Result Value Ref Range    Glucose, POC 258 (H) 70 - 100 mg/dL   POC GLUCOSE    Collection Time: 04/28/24  9:13 PM   Result Value Ref Range    Glucose, POC 245 (H) 70 - 100 mg/dL   COMPREHENSIVE METABOLIC PANEL    Collection Time: 04/29/24  5:27 AM   Result Value Ref Range    Sodium 134 (L) 137 - 147 mmol/L    Potassium 3.2 (L) 3.5 - 5.1 mmol/L    Chloride 101 98 - 110 mmol/L    Glucose 258 (H) 70 - 100 mg/dL    Blood Urea Nitrogen 35 (H) 7 - 25 mg/dL    Creatinine 4.92 (H) 0.40 - 1.00 mg/dL    Calcium  8.4 (L) 8.5 - 10.6 mg/dL    Total Protein 7.1 6.0 - 8.0 g/dL    Total Bilirubin 0.4 0.2 - 1.3 mg/dL    Albumin 3.7 3.5 - 5.0 g/dL    Alk Phosphatase 63 25 - 110 U/L    AST 29 7 - 40 U/L    ALT 24 7 - 56 U/L    CO2 21 21 - 30 mmol/L    Anion Gap 12 3 - 12    Glomerular Filtration Rate (GFR) 9 (L) >60 mL/min   MAGNESIUM     Collection Time: 04/29/24  5:27 AM   Result Value Ref Range    Magnesium  1.8 1.6 - 2.6 mg/dL   PHOSPHORUS    Collection Time: 04/29/24  5:27 AM   Result Value Ref Range    Phosphorus 2.8 2.0 - 4.5 mg/dL   CBC AND DIFF    Collection Time: 04/29/24  5:52 AM   Result Value Ref Range    White Blood Cells 9.30 4.50 - 11.00 10*3/uL    Red Blood Cells 3.78 (L) 4.00 - 5.00 10*6/uL    Hemoglobin 10.0 (L) 12.0 - 15.0 g/dL    Hematocrit 70.3 (L) 36.0 - 45.0 %    MCV 78.3 (L) 80.0 - 100.0 fL    MCH 26.4 26.0 - 34.0 pg    MCHC 33.7 32.0 - 36.0 g/dL    RDW 82.7 (H) 88.9 - 15.0 %    Platelet Count 185 150 - 400 10*3/uL    MPV 8.9 7.0 - 11.0 fL       Point of Care Testing  (Last 24 hours)  Glucose: (!) 258 (04/29/24 0527)  POC Glucose (Download): (!) 245 (04/28/24 2113)    Radiology and other Diagnostics Review:    Last Images past 24 hours:

## 2024-04-29 NOTE — Progress Notes
 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 On PD Jan 2019  DDRT  KPDI 92%, cPRA 0%  Physical Crossmatch B cell negative T cell negative  CMV D -/ R -  Induction: Thymo  Maintenance IS: Dual  IS   PD not removed with surgery   Post-op Complications:  - AKI on 04/13/2023, admitted with sCr of 5.4. volume depletion, hydronephrosis/hydroureter.  PCNU removed 09/22/2023, dilation of transplant ureteral stricture and placement of ureteral stent - Requiring frequent exchanges.   -- PET scan on 06/20/2023: Findings compatible with reactive lymph nodes with less consideration for pathological process or PTLD   - Kidney transplant biopsy 10/21/2020: no evidence of rejection, +tubular injury, mild IFTA (~15-20%) and some Ii-IFTA, no significant tubulitis or ptc, no glomerulitis, arteries a little on the thick side, mild to moderate arteriosclerosis in large vessels, arterioles ok, C4d negative, glomeruli look ok, IF panel negative;     Subjective:  Denies any cramping, nausea, abdominal pain, dizziness.     REVIEW OF SYSTEMS: Comprehensive 14-point ROS reviewed  Positives noted in HPI otherwise negative.    Allergies:   Allergies[1]    Past Medical History:  Past Medical History:    Abnormal biopsy of kidney    Anemia    Chronic kidney disease    Coronary artery disease due to lipid rich plaque    DM (diabetes mellitus), type 2 (CMS-HCC)    ESRD (end stage renal disease) (CMS-HCC)    Gastroparesis    GERD (gastroesophageal reflux disease)    H/O kidney transplant    HLD (hyperlipidemia)    Hypertension    Kidney failure    Kidney stones    MGUS (monoclonal gammopathy of unknown significance)    Obesity    Proteinuria    PUD (peptic ulcer disease)    PVD (peripheral vascular disease)    Urinary tract infection       Social History:  Social History     Socioeconomic History    Marital status: Married     Spouse name: Roger    Number of children: 2   Tobacco Use    Smoking status: Never    Smokeless tobacco: Never   Vaping Use    Vaping status: Never Used   Substance and Sexual Activity    Alcohol use: Not Currently    Drug use: Never    Sexual activity: Not Currently     Partners: Male     Birth control/protection: None   Social History Narrative    [place] in [place]. Works at [employer], puts labels on bottles.       Surgical History:  Surgical History:   Procedure Laterality Date    ANGIOGRAPHY CORONARY ARTERY WITH LEFT HEART CATHETERIZATION N/A 07/03/2020    Performed by Gupta, Kamal, MD at [facility] CATH LAB    POSSIBLE PERCUTANEOUS CORONARY STENT PLACEMENT WITH ANGIOPLASTY N/A 07/03/2020    Performed by Gupta, Kamal, MD at [facility] CATH LAB    ALLOTRANSPLANTATION KIDNEY FROM NON LIVING DONOR WITHOUT RECIPIENT NEPHRECTOMY N/A 07/15/2020    Performed by Terrie Sieving, MD at [facility] OR    REMOVAL TUNNELED INTRAPERITONEAL CATHETER Bilateral 08/24/2020    Performed by Terrie Sieving, MD at [facility] OR    CYSTOURETHROSCOPY WITH REMOVAL  FOREIGN BODY/ CALCULUS/ STENT FROM URETHRA/ BLADDER - SIMPLE Right 08/24/2020    Performed by Carolynn Ruther ORN, MD at [facility] OR    ESOPHAGOGASTRODUODENOSCOPY WITH BIOPSY - FLEXIBLE N/A 04/17/2023    Performed by Chipper Neas, MD at [facility] ENDO    COLONOSCOPY WITH BIOPSY - FLEXIBLE N/A 04/17/2023    Performed by Chipper Neas, MD at [facility] ENDO    ESOPHAGOGASTRODUODENOSCOPY WITH SPECIMEN COLLECTION BY BRUSHING/ WASHING N/A 07/21/2023    Performed by Arlan Nancyann PARAS, MD at [facility] ENDO    ANTEGRADE UROGRAPHY Right 09/22/2023    Performed by Erskin Alm LABOR, MD at [facility] OR    RETROGRADE UROGRAPHY WITH/ WITHOUT KUB Right 09/22/2023    Performed by Erskin Alm LABOR, MD at [facility] OR    REMOVAL, NEPHROSTOMY TUBE, WITH FLUOROSCOPIC GUIDANCE Right 09/22/2023    Performed by Erskin Alm LABOR, MD at [facility] OR CYSTOURETHROSCOPY WITH URETEROSCOPY WITH TREATMENT URETERAL STRICTURE Right 09/22/2023    Performed by Erskin Alm LABOR, MD at [facility] OR    CYSTOURETHROSCOPY WITH URETEROSCOPY AND/ OR PYELOSCOPY - DIAGNOSTIC Right 11/17/2023    Performed by Erskin Alm LABOR, MD at [facility] OR    RETROGRADE UROGRAPHY WITH/ WITHOUT KUB Right 11/17/2023    Performed by Erskin Alm LABOR, MD at [facility] OR    CYSTOURETHROSCOPY WITH REMOVAL FOREIGN BODY/ CALCULUS/ STENT FROM URETHRA/ BLADDER - SIMPLE Right 11/17/2023    Performed by Erskin Alm LABOR, MD at [facility] OR    CATHETER IMPLANT/REVISION      PD cath    CESAREAN SECTION  1991    HX CESAREAN SECTION  1991    HX HYSTERECTOMY  2000    still has ovaries    HX LITHOTRIPSY      HX TUBAL LIGATION  1995         Family History:  Family History   Problem Relation Name Age of Onset    Diabetes Mother Mom     Hypertension Mother Mom     Diabetes Father Lukemia     Hypertension Father Lukemia     Cancer-Hematologic Father Lukemia     Cancer Father Lukemia     Diabetes Sister Macario     Migraines Sister Macario     Cancer Sister Macario         Endometrial       Current Medications:  Current Medications[2]    Physical Exam:  BP 114/80 (BP Source: Arm, Left Upper)  - Pulse 89  - Temp 36.6 ?C (97.8 ?F)  - Ht 172.7 cm (5' 8)  - Wt 85.5 kg (188 lb 7.9 oz)  - LMP  (LMP Unknown)  - SpO2 100%  - BMI 28.66 kg/m?     Intake/Output Summary (Last 24 hours) at 04/29/2024 1546  Last data filed at 04/29/2024 1228  Gross per 24 hour   Intake 1560 ml   Output 2050 ml   Net -490 ml     General: In NAD; A&Ox3  Skin: Warm, dry, no signs of rash   Neck: Supple   CV: Regular, regular rate, normal S1/S2, no murmurs  Lungs: CTA bilaterally in posterior fields  Abd: Grossly normal without rebound, guarding, masses, or bruits   Ext: no edema  Neuro: No focal deficits   Psych: Affect appropriate    Laboratory studies:   CMP:      Latest Ref Rng & Units 04/29/2024     5:27 AM 04/28/2024     4:00 AM 04/27/2024  4:33 AM 04/26/2024     4:06 AM 04/25/2024 3:58 AM   CMP   Sodium 137 - 147 mmol/L 134  139  138  140  141    Potassium 3.5 - 5.1 mmol/L 3.2  3.1  3.2  3.3  3.4    Chloride 98 - 110 mmol/L 101  103  101  103  107    CO2 21 - 30 mmol/L 21  27  25  25  21     Anion Gap 3 - 12 12  9  12  12  13     Blood Urea Nitrogen 7 - 25 mg/dL 35  25  41  78  76    Creatinine 0.40 - 1.00 mg/dL 4.92  5.99  4.27  1.66  9.04    Glucose 70 - 100 mg/dL 741  821  765  703  864    Calcium  8.5 - 10.6 mg/dL 8.4  8.4  8.4  8.7  8.2    Total Protein 6.0 - 8.0 g/dL 7.1  7.1  7.2  7.2  6.9    Albumin 3.5 - 5.0 g/dL 3.7  3.6  3.5  3.6  3.4    Alk Phosphatase 25 - 110 U/L 63  64  66  75  60    ALT (SGPT) 7 - 56 U/L 24  17  15  16  12     AST 7 - 40 U/L 29  20  18  20  18     Total Bilirubin 0.2 - 1.3 mg/dL 0.4  0.3  0.3  0.3  0.3    GFR >60 mL/min 9  12  8  5  5       Tacrolimus  LC-MS/MS (no units)   Date Value   08/03/2021 4.3 (L)   07/10/2021 7.3   06/26/2021 7.9   06/12/2021 9.3   05/29/2021 8.3     Tacrolimus  Immunoassay (ng/mL)   Date Value   04/29/2024 4.0 (L)   04/28/2024 4.4 (L)   04/27/2024 5.1   04/26/2024 6.6   04/25/2024 8.8   04/25/2023 7.3   04/21/2023 5.1   04/20/2023 6.1   04/19/2023 5.7   04/18/2023 4.9 (L)     Additional Chemistries / Infectious Labs:   Hemoglobin A1C (%)   Date Value   04/21/2024 6.3 (H)   01/19/2024 7.2 (H)   04/13/2023 11.1 (H)   10/30/2021 11.6 (H)   05/29/2021 10.8 (H)   ,   PTH Hormone   Date Value   01/19/2024 176.2 pg/mL (H)   10/03/2023 116.6 pg/mL (H)   07/24/2023 104.0 pg/mL (H)   04/25/2023 151.5 PG/ML (H)   10/30/2021 195.9 PG/ML (H)   10/14/2020 96.1 PG/ML (H)     Vitamin D (25-OH)Total (NG/ML)   Date Value   04/25/2023 35.7   10/30/2021 33.8   10/14/2020 28.4 (L)     Vitamin D  (25-OH) Total (ng/mL)   Date Value   01/19/2024 36   10/03/2023 40   , No results found for: LIPASE, AMY,   BK Virus Plasma Quant (no units)   Date Value   04/14/2023 BK Virus Not Detected   10/30/2021     NOT DETECTED  Reference range: NOT DETECTED  Unit: IU/mL  .  Assay Range: 33 IU/mL to 3.30E+08 IU/mL  .  One IU is equal to 0.33 copies of BKV.  .  The limit of quantitation (LOQ) is 33 IU/mL. BK virus DNA detected  below the LOQ  will be reported as Detected:<33 IU/mL.  .  This test was developed and its performance characteristics  determined by  [facility]. It has not been cleared or approved by the U.[place].  Food and Drug  Administration. Results should be used in conjunction with clinical  findings,  and should not form the sole basis for a diagnosis or treatment  decision.  ____________________________________________________________  Testing Performed At:  [facility]  [address]  [place], [place] [address]  Laboratory Director: Lucious Betters Ph.D., BCLD (ABB)  CLIA#: [id number]  Phone: [phone]     10/02/2021     NOT DETECTED  Reference range: NOT DETECTED  Unit: IU/mL  TESTING PERFORMED AT LOW VOLUME ON PLASMA SPECIMEN FOR BKV qPCR, MAY  AFFECT  RESULTS.  SABRA  Assay Range: 33 IU/mL to 3.30E+08 IU/mL  .  One IU is equal to 0.33 copies of BKV.  .  The limit of quantitation (LOQ) is 33 IU/mL. BK virus DNA detected  below the LOQ  will be reported as Detected:<33 IU/mL.  .  This test was developed and its performance characteristics  determined by  [facility]. It has not been cleared or approved by the U.S.  Food and Drug  Administration. Results should be used in conjunction with clinical  findings,  and should not form the sole basis for a diagnosis or treatment  decision.  ____________________________________________________________  Testing Performed At:  [facility]  [address]  [place], [place] [address]  Laboratory Director: Lucious Betters Ph.D., BCLD (ABB)  CLIA#: [id number]  Phone: [phone]       BK Virus Plasma (no units)   Date Value   04/19/2024 Not Detected   03/29/2024 Not Detected   03/08/2024 Not Detected   ,   CMV DNA Quant PCR ([IU]/mL)   Date Value   04/14/2023 CMV DNA NOT DETECTED   12/04/2021 CMV DNA NOT DETECTED 10/30/2021 CMV DNA NOT DETECTED   , and   EBV DNA, Quant. (no units)   Date Value   04/14/2023 EBV DNA Not Detected     Glomerulitis / Autoimmune Labs:   Lactate Dehydrogenase (U/L)   Date Value   04/20/2023 175   11/28/2017 157   ,   Complemnt C3 (MG/DL)   Date Value   90/86/7975 118.0     Complemnt C4 (MG/DL)   Date Value   90/86/7975 30.0   , and No results found for: ANCASCN, MYELOPERAB, MYELOPEROX  CBC with Diff:      Latest Ref Rng & Units 04/29/2024     5:52 AM 04/28/2024     4:00 AM   CBC with Diff   WBC 4.50 - 11.00 10*3/uL 9.30  8.50    RBC 4.00 - 5.00 10*6/uL 3.78  3.74    Hemoglobin 12.0 - 15.0 g/dL 89.9  9.8    Hematocrit 36.0 - 45.0 % 29.6  29.4    MCV 80.0 - 100.0 fL 78.3  78.7    MCH 26.0 - 34.0 pg 26.4  26.3    MCHC 32.0 - 36.0 g/dL 66.2  66.5    RDW 88.9 - 15.0 % 17.2  17.1    Platelet Count 150 - 400 10*3/uL 185  204    MPV 7.0 - 11.0 fL 8.9  8.3    Neurtrophils 41.0 - 77.0 %  66.7    Absolute Neutrophils 1.8 - 7.0 10*3/uL 7.5  5.70    Lymphocytes 24 - 44 % 7  Absolute Lymph Count 1.00 - 4.80 10*3/uL  1.50    Monocytes 4 - 12 % 6     Absolute Monocyte Count 0.00 - 0.80 10*3/uL  0.90    Eosinophils 0.0 - 5.0 %  4.2    Absolute Eosinophil Count 0.00 - 0.45 10*3/uL  0.40    Basophils 0.0 - 2.0 %  0.8      Hematology:   Lab Results   Component Value Date/Time    IRON  20 (L) 03/08/2024 10:33 AM    TIBC 201 (L) 03/08/2024 10:33 AM    PSAT 10 (L) 03/08/2024 10:33 AM    FERRITIN 398 (H) 03/08/2024 10:33 AM    FERRITIN 327 (H) 01/19/2024 12:17 PM     Urinalysis:  Lab Results   Component Value Date/Time    UCOLOR Red 04/21/2024 08:52 PM    TURBID 2+ (A) 04/21/2024 08:52 PM    USPGR 1.015 04/21/2024 08:52 PM    UPH 6.0 04/21/2024 08:52 PM    UPROTEIN 3+ (A) 04/21/2024 08:52 PM    UAGLU 3+ (A) 04/21/2024 08:52 PM    UKET 1+ (A) 04/21/2024 08:52 PM    UBILE Negative 04/21/2024 08:52 PM    UBLD 2+ (A) 04/21/2024 08:52 PM    UROB Normal 04/21/2024 08:52 PM     Protein/CR ratio (no units)   Date Value 04/19/2024 2.13 (H)   03/29/2024 0.72 (H)   03/08/2024 1.12 (H)   01/19/2024 0.76 (H)   12/22/2023 0.81 (H)   04/13/2023 1.7 (H)   01/08/2022 0.7 (H)   12/04/2021 0.7 (H)   10/30/2021 0.7 (H)   10/02/2021 0.5 (H)     Wt Readings from Last 10 Encounters:   04/29/24 85.5 kg (188 lb 7.9 oz)   03/18/24 93.5 kg (206 lb 3.2 oz)   02/13/24 94.3 kg (208 lb)   02/09/24 96.2 kg (212 lb)   11/17/23 95.9 kg (211 lb 6.7 oz)   11/01/23 98.1 kg (216 lb 3.2 oz)   10/13/23 96.2 kg (212 lb)   10/03/23 97.5 kg (215 lb)   09/22/23 96.2 kg (212 lb 1.3 oz)   09/21/23 97.1 kg (214 lb)     SURGICAL PATHOLOGY: [id number]  Order: [id number]   Collected 04/23/2024 09:11       Status: Final result    Test Result Released: Yes (not seen)    0 Result Notes      Component  Ref Range & Units (hover)    Clinical Information    ESRD s/p renal transplant in 2021 now with AKI   Final Diagnosis   A, B. Right Kidney (transplant kidney core biopsy):     SEVERE INTERSTITIAL FIBROSIS AND TUBULAR ATROPHY.  ACUTE TUBULAR INJURY.  SEE COMMENT AND DESCRIPTION.     COMMENT: There biopsy shows severe interstitial fibrosis with accompanying i-IFTA. The few non-atrophic tubules show flattening and focal rupture. The findings do not support a diagnosis of either T cell or antibody mediated rejection. There is no evidence of monoclonal immunoglobulin deposition. Preliminary results were communicated to Dr. Mabel Kays on 04/24/24.         Assessment and Plan:  Jacqueline Shields, 58 y.o. 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 cPRA 0%, KDPI 92%. Admitted on 04/21/24 for AKI.    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   #) Prior nephrolithiasis in native kidneys   - After discussion with patient, we will begin HD due to severe graft dysfunction with marked IFTA  - Baseline sCr: 3-4 since 2025  - Peak creatinine during this admission - 11.95  - I/O: 810/550 cc = +260  - K 3.2 - continue with repletion   - CO2 acceptable  - Mg acceptable  - Volume status: euvolemic   - UA: 3+ protein, 2+ blood, packed WBC and RBC, 3+ leukocytes and packed bacteria with WBC clumps   - Renal transplant US  04/21/24: minimal hydro, echogenic areas in R collecting system with gas, infection vs calculi, vs recent catheterization.  - Biopsy 04/23/24 - marked fibrosis with severe IFTA and ATN  - HD session #3 today with no UF  - Will required OP dialysis clinic for chronic dialysis with monitoring due to AKI.    #) ID   - CMV D-/R-  - BK and CMV negative on 04/19/24  - Urine culture with strep agalactiae 04/21/24, received CTX for 4 days     #) BPs:   - SBP 114-150s  - Not on therapy     #) Diabetes  - Per primary  - Last A1c 6.3 on 04/21/24    #) Heme  - Hgb 10.0 at goal, WBC 9.30 and PLT 185    #) CKD-MBD  - PTH 176 on 01/19/24  - Vitamin D  36 on 01/19/24  - Ca acceptable  - PO4 acceptable      #) MGUS, IgG kappa  - Ongoing follow-up with hematology  - FLC ratio 1.95 - 01/19/24  - SPEP with spike in beta or gamma region 01/19/24     #) CAD s/p cath 07/03/20: superior branch LAD 80% disease, RCA 40-50% mid RCA stenosis at/around origin RV marginal branch; RV marginal branch also has ~80% stenosis; guide directed medical therapy, risk factor optimization  -On crestor     Sharrell Bonner Fortune, MD  [facility]  Service Pager: [phone]         [1] No Known Allergies  [2]   Current Facility-Administered Medications:     acetaminophen  (TYLENOL ) tablet 650 mg, 650 mg, Oral, Q4H PRN, Funaro, Danielle, DO, 650 mg at 04/26/24 0416    calcium  carbonate (TUMS) chew tablet 500 mg, 500 mg, Oral, Q4H PRN, Wiemholt, Edward B, DO, 500 mg at 04/22/24 1018    dextrose  50% (D50) syringe 25-50 mL, 12.5-25 g, Intravenous, PRN, Funaro, Danielle, DO    heparin  (porcine) PF syringe 5,000 Units, 5,000 Units, Subcutaneous, Q8H, Wiemholt, Edward B, DO, 5,000 Units at 04/29/24 1435    influenza (=>65 Yo) trivalent HIGH DOSE (FLUZONE) 180 mcg/0.5 mL 2025-26 PF syringe 0.5 mL, 0.5 mL, Intramuscular, ONCE, Moudgal, Donzell SAILOR, MD    insulin  aspart (U-100) (NOVOLOG  FLEXPEN U-100 INSULIN ) injection PEN 0-6 Units, 0-6 Units, Subcutaneous, ACHS (22), Funaro, Danielle, DO, 1 Units at 04/28/24 2116    insulin  aspart (U-100) (NOVOLOG  FLEXPEN U-100 INSULIN ) injection PEN 12 Units, 12 Units, Subcutaneous, QDAY w/breakfast, Azalee Dallas NOVAK, DO, 12 Units at 04/28/24 0903    insulin  aspart (U-100) (NOVOLOG   FLEXPEN U-100 INSULIN ) injection PEN 12 Units, 12 Units, Subcutaneous, QDAY w/dinner, Azalee Loving B, DO, 12 Units at 04/28/24 1726    insulin  aspart (U-100) (NOVOLOG  FLEXPEN U-100 INSULIN ) injection PEN 12 Units, 12 Units, Subcutaneous, QDAY w/lunch, Wiemholt, Edward B, DO, 12 Units at 04/29/24 1320    insulin  glargine (LANTUS  SOLOSTAR U-100 INSULIN ) injection PEN 50 Units, 50 Units, Subcutaneous, QHS(22), Azalee Loving NOVAK, DO, 50 Units at 04/28/24 2115    loperamide  (IMODIUM  A-D) capsule 2 mg, 2 mg, Oral, PRN, Greve, Lacey K, MD, 2 mg at 04/29/24 1224    melatonin tablet 5 mg, 5 mg, Oral, QHS PRN, Funaro, Danielle, DO    ondansetron  (ZOFRAN  ODT) rapid dissolve tablet 4 mg, 4 mg, Oral, Q6H PRN **OR** ondansetron  HCL (PF) (ZOFRAN  (PF)) injection 4 mg, 4 mg, Intravenous, Q6H PRN, Funaro, Danielle, DO, 4 mg at 04/27/24 1020    oxyBUTYnin  chloride (DITROPAN ) tablet 5 mg, 5 mg, Oral, BID PRN, Funaro, Danielle, DO, 5 mg at 04/28/24 1056    polyethylene glycol 3350  (MIRALAX ) packet 17 g, 1 packet, Oral, QDAY PRN, Funaro, Danielle, DO    rosuvastatin  (CRESTOR ) tablet 10 mg, 10 mg, Oral, QDAY, Funaro, Danielle, DO, 10 mg at 04/29/24 1223    sennosides-docusate sodium  (SENOKOT-S) tablet 1 tablet, 1 tablet, Oral, QDAY PRN, Funaro, Danielle, DO, 1 tablet at 04/24/24 0854    sodium chloride  0.9% IV bolus 100 mL, 100 mL, Intravenous, PRN, Fernande Reyes LABOR, MD    sodium chloride  0.9% IV bolus 100 mL, 100 mL, Intravenous, PRN, Fernande Reyes LABOR, MD    sodium chloride  0.9% IV bolus 300 mL, 300 mL, Intravenous, PRN, Fernande Reyes LABOR, MD, Last Rate: 947.4 mL/hr at 04/29/24 0838, 300 mL at 04/29/24 9161    tacrolimus  (PROGRAF ) capsule 0.5 mg, 0.5 mg, Oral, BID, Deidra Mabel RAMAN, MD, 0.5 mg at 04/29/24 [phone]

## 2024-04-29 NOTE — Procedures
 Name: Jacqueline Shields            MRN: [id number]                DOB: 1966/05/07          Age: 58 y.o.  Admission Date: 04/21/2024             LOS: 8 days    Hemodialysis Procedure Report    Report received from Primary RN, Betsy at [phone].  Hemodialysis treatment performed at [facility]  Patient arrived to Unit at 0810 with transport via Wheelchair.  Patient ID and consent verified: Yes   Lab and orders reviewed: Yes .  Patient attached to Cardiac, NIBP, SpO2, and Critline  Access:Tunneled HD Catheter on right side.  Catheter lumens accessed. aspirated, and flushed without difficulty.  Comment/Event: none    TREATMENT START TIME: 0820  Prescribed treatment parameters achieved at treatment start.  Patient's face uncovered and in view: Yes .  Lines and access secure, in view and intact: Yes .  Dr. Fernande notified at 0840 patient is on treatment and estimated time off.      Symptoms or events during treatment: none        TREATMENT END TIME: 1122  Net UF: 1L.  End weight: 85.5kgkg per standing.  Catheter lumens flushed and packed per MAR.  Red dead end caps applied.    Report given to primary care RN Delon at 1145.  Patient left unit via Wheelchair with Transporter.    No further questions or concerns at this time.    See Hemodialysis flowsheet and MAR for further details.

## 2024-04-30 ENCOUNTER — Encounter: Admit: 2024-04-30 | Discharge: 2024-04-30 | Payer: PRIVATE HEALTH INSURANCE

## 2024-04-30 DIAGNOSIS — Z8711 Personal history of peptic ulcer disease: Secondary | ICD-10-CM

## 2024-04-30 DIAGNOSIS — Z833 Family history of diabetes mellitus: Secondary | ICD-10-CM

## 2024-04-30 DIAGNOSIS — R112 Nausea with vomiting, unspecified: Secondary | ICD-10-CM

## 2024-04-30 DIAGNOSIS — T8619 Other complication of kidney transplant: Secondary | ICD-10-CM

## 2024-04-30 DIAGNOSIS — E43 Unspecified severe protein-calorie malnutrition: Secondary | ICD-10-CM

## 2024-04-30 DIAGNOSIS — D631 Anemia in chronic kidney disease: Secondary | ICD-10-CM

## 2024-04-30 DIAGNOSIS — E872 Acidosis, unspecified: Secondary | ICD-10-CM

## 2024-04-30 DIAGNOSIS — Z87442 Personal history of urinary calculi: Secondary | ICD-10-CM

## 2024-04-30 DIAGNOSIS — Z794 Long term (current) use of insulin: Secondary | ICD-10-CM

## 2024-04-30 DIAGNOSIS — T8611 Kidney transplant rejection: Secondary | ICD-10-CM

## 2024-04-30 DIAGNOSIS — E1122 Type 2 diabetes mellitus with diabetic chronic kidney disease: Secondary | ICD-10-CM

## 2024-04-30 DIAGNOSIS — Z79899 Other long term (current) drug therapy: Secondary | ICD-10-CM

## 2024-04-30 DIAGNOSIS — N200 Calculus of kidney: Secondary | ICD-10-CM

## 2024-04-30 DIAGNOSIS — E1143 Type 2 diabetes mellitus with diabetic autonomic (poly)neuropathy: Secondary | ICD-10-CM

## 2024-04-30 DIAGNOSIS — E785 Hyperlipidemia, unspecified: Secondary | ICD-10-CM

## 2024-04-30 DIAGNOSIS — D84821 Immunodeficiency due to drugs: Secondary | ICD-10-CM

## 2024-04-30 DIAGNOSIS — Z807 Family history of other malignant neoplasms of lymphoid, hematopoietic and related tissues: Secondary | ICD-10-CM

## 2024-04-30 DIAGNOSIS — N170 Acute kidney failure with tubular necrosis: Secondary | ICD-10-CM

## 2024-04-30 DIAGNOSIS — Z90710 Acquired absence of both cervix and uterus: Secondary | ICD-10-CM

## 2024-04-30 DIAGNOSIS — Z7982 Long term (current) use of aspirin: Secondary | ICD-10-CM

## 2024-04-30 DIAGNOSIS — E1151 Type 2 diabetes mellitus with diabetic peripheral angiopathy without gangrene: Secondary | ICD-10-CM

## 2024-04-30 DIAGNOSIS — Z8249 Family history of ischemic heart disease and other diseases of the circulatory system: Secondary | ICD-10-CM

## 2024-04-30 DIAGNOSIS — D472 Monoclonal gammopathy: Secondary | ICD-10-CM

## 2024-04-30 DIAGNOSIS — Z820 Family history of epilepsy and other diseases of the nervous system: Secondary | ICD-10-CM

## 2024-04-30 DIAGNOSIS — N186 End stage renal disease: Secondary | ICD-10-CM

## 2024-04-30 DIAGNOSIS — Z809 Family history of malignant neoplasm, unspecified: Secondary | ICD-10-CM

## 2024-04-30 DIAGNOSIS — I2510 Atherosclerotic heart disease of native coronary artery without angina pectoris: Secondary | ICD-10-CM

## 2024-04-30 DIAGNOSIS — Z7985 Long-term (current) use of injectable non-insulin antidiabetic drugs: Secondary | ICD-10-CM

## 2024-04-30 DIAGNOSIS — I2583 Coronary atherosclerosis due to lipid rich plaque: Secondary | ICD-10-CM

## 2024-04-30 DIAGNOSIS — Z6828 Body mass index (BMI) 28.0-28.9, adult: Secondary | ICD-10-CM

## 2024-04-30 LAB — MANUAL DIFF
~~LOC~~ BKR BANDS - RELATIVE: 1 % — ABNORMAL HIGH (ref 0–10)
~~LOC~~ BKR LYMPHOCYTES - RELATIVE: 13 % — ABNORMAL LOW (ref 24–44)
~~LOC~~ BKR METAMYELOCYTES - RELATIVE: 3 % (ref 0.2–1.3)
~~LOC~~ BKR MONOCYTES - RELATIVE: 8 % — ABNORMAL LOW (ref 4–12)
~~LOC~~ BKR MYELOCYTES - RELATIVE: 3 % — ABNORMAL LOW (ref 3.5–5.0)
~~LOC~~ BKR NEUT+BANDS - ABSOLUTE: 7.2 10*3/uL — ABNORMAL HIGH (ref 1.8–7.0)
~~LOC~~ BKR NEUTROPHILS - RELATIVE: 72 % (ref 41–77)

## 2024-04-30 LAB — POC GLUCOSE
~~LOC~~ BKR POC GLUCOSE: 340 mg/dL — ABNORMAL HIGH (ref 70–100)
~~LOC~~ BKR POC GLUCOSE: 152 mg/dL — ABNORMAL HIGH (ref 70–100)
~~LOC~~ BKR POC GLUCOSE: 181 mg/dL — ABNORMAL HIGH (ref 70–100)
~~LOC~~ BKR POC GLUCOSE: 260 mg/dL — ABNORMAL HIGH (ref 70–100)

## 2024-04-30 MED ORDER — TACROLIMUS 0.5 MG PO CAP
.5 mg | ORAL_CAPSULE | Freq: Two times a day (BID) | ORAL | 0 refills | 30.00000 days | Status: DC
Start: 2024-04-30 — End: 2024-05-08
  Filled 2024-04-30: qty 60, 30d supply, fill #0

## 2024-04-30 MED ORDER — LOPERAMIDE 2 MG PO CAP
2 mg | ORAL_CAPSULE | Freq: Three times a day (TID) | ORAL | 0 refills | 23.00000 days | Status: AC | PRN
Start: 2024-04-30 — End: ?
  Filled 2024-04-30: qty 30, 10d supply, fill #0

## 2024-04-30 NOTE — Discharge Instructions - Pharmacy
 Discharge Summary      Name: Jacqueline Shields  Medical Record Number: [id number]        Account Number:  [id number]  Date Of Birth:  1965/09/27                         Age:  58 y.o.  Admit date:  04/21/2024                     Discharge date: 04/30/2024      Discharge Attending:  Armida MARLA Repress, MD    Discharge Summary Completed By: Armida MARLA Repress, MD    Service: Med Private S [phone]    Reason for hospitalization:  Metabolic acidosis [E87.20]    Primary Discharge Diagnosis:   Metabolic acidosis    Hospital Diagnoses:  Hospital Problems        Active Problems    Diabetes mellitus (CMS-HCC)    AKI (acute kidney injury)       Resolved Problems    * (Principal) RESOLVED: Metabolic acidosis    RESOLVED: Severe malnutrition    RESOLVED: Urinary tract infection    RESOLVED: High anion gap metabolic acidosis     Present on Admission:   (Resolved) Metabolic acidosis   AKI (acute kidney injury)   Diabetes mellitus (CMS-HCC)   (Resolved) Severe malnutrition        Significant Past Medical History        Abnormal biopsy of kidney  Anemia  Chronic kidney disease  Coronary artery disease due to lipid rich plaque  DM (diabetes mellitus), type 2 (CMS-HCC)  ESRD (end stage renal disease) (CMS-HCC)  Gastroparesis  GERD (gastroesophageal reflux disease)  H/O kidney transplant  HLD (hyperlipidemia)  Hypertension  Kidney failure  Kidney stones  MGUS (monoclonal gammopathy of unknown significance)  Obesity  Proteinuria  PUD (peptic ulcer disease)  PVD (peripheral vascular disease)  Urinary tract infection    Allergies   Patient has no known allergies.    Brief Hospital Course   The patient was admitted and the following issues were addressed during this hospitalization: (with pertinent details including admission exam/imaging/labs).    Initially admitted on 04/21/2024 for acute kidney injury and UTI.  Presented with nausea/vomiting.  Initial CT abdomen pelvis showed interval development of mild left-sided hydronephrosis as well as tiny nonobstructive calculi within the lower pole calyces of the right kidney.  Also noted redemonstration of right pelvic renal transplant with severe hydronephrosis and hydroureter and a month slightly distended urinary bladder with wall thickening.  UA was remarkable for infectious appearing urine culture grew strep agalactiae.  Underwent a course of IV ceftriaxone  for UTI initially requiring volume resuscitation with IV fluids creatinine at outside hospital was 12 initially.  Baseline is around 5.  Had a elevated anion gap and lactic acidosis prior to arrival.  Prior to arrival immunosuppressive regimen with everolimus  1 mg twice daily and tacrolimus  1 mg twice daily.  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  Eventually required renal replacement therapy and placement of a tunneled HD catheter.  Was still making urine through admission.  Immunosuppression was ulcerated to Prograf  0.5 mg twice daily everolimus  was discontinued.  Not currently on steroids will discharge home on steroids.  Was able to tolerate 3 sessions of dialysis during admission and had outpatient dialysis set up with plan for continued outpatient following in  the setting of AKI and renal transplant failure.  On day of discharge was feeling well and had received hemodialysis the day prior.  She will go up for repeat dialysis on Wednesday.  We discussed her immunosuppression regimen.  Had Foley catheter removed 1 day prior to discharge and was able to void on her own although she was making less urine than usual.  No concerns of active urinary retention on day of discharge.    Day of discharge exam notable for:   General:  Alert, awake, oriented x 3  Head:  Normocephalic, without obvious abnormality, atraumatic  Eyes:  Conjunctivae/corneas clear   Nose: Nares normal. Mucosa normal.    Lungs:  Clear to auscultation bilaterally  Heart:   Regular rate and rhythm, S1, S2 normal, no murmur  Abdomen:  Soft, non-tender mildly distended but soft.  Bowel sounds normal.  No masses.  No organomegaly.  Extremities: Extremities normal, atraumatic, no cyanosis or edema  Skin: Skin color, texture, turgor normal.    Neurologic: Non focal grossly      Items Needing Follow Up   Pending items or areas that need to be addressed at follow up:   - Follow-up with nephrology/transplant nephrology for continued dialysis and lab monitoring.  -Follow-up with PCP and monitor blood glucose trend and A1c on diabetic regimen with attempt for tight glucose control.  Held prior to admission Mounjaro  can resume with follow-up pending blood glucose  Pending Labs and Follow Up Radiology    Pending labs and/or radiology review at this time of discharge are listed below: Please note- any labs with collected status will not have a result; if this area is blank, there are no items for review.   Pending Labs       Order Current Status    MISCELLANEOUS LAB TEST Collected (04/22/24 1446)    TACROLIMUS  In process          US  KIDNEY TRANSPLANT    Medications      Medication List      PAUSE taking these medications     MOUNJARO  7.5 mg/0.5 mL injector PEN; Wait to take this until your doctor   or other care provider tells you to start again.; Generic drug:   tirzepatide ; Dose: 7.5 mg; Inject 0.5 mL under the skin every 7 days.   Indications: type 2 diabetes mellitus; For: type 2 diabetes mellitus;   Quantity: 6 mL; Refills: 3     START taking these medications     LANTUS  SOLOSTAR U-100 INSULIN  100 unit/mL (3 mL) subcutaneous PEN;   Generic drug: insulin  glargine; Dose: 50 Units; Doctor's comments: The   pharmacist may select Lantus  Solorstar or Basaglar  Kary based on what   is cheaper for the patient.; Inject fifty Units under the skin at bedtime   daily. Indications: type 2 diabetes mellitus; For: type 2 diabetes   mellitus; Quantity: 60 mL; Refills: 3   loperamide  2 mg capsule; Commonly known as: IMODIUM  A-D; Dose: 2 mg;   Take one capsule by mouth three times daily as needed for Diarrhea.   Indications: diarrhea; For: diarrhea; Quantity: 30 capsule; Refills: 0     CHANGE how you take these medications     ferrous sulfate  325 mg (65 mg iron ) tablet; Commonly known as: FEOSOL;   Dose: 325 mg; Take one tablet by mouth at bedtime daily. Take on an empty   stomach at least 1 hour before or 2 hours after food.; Quantity: 30   tablet; Refills:  3; What changed: when to take this   tacrolimus  0.5 mg capsule; Commonly known as: PROGRAF ; Dose: 0.5 mg;   Take one capsule by mouth twice daily. Indications: prevent kidney   transplant rejection; For: prevent kidney transplant rejection; Quantity:   60 capsule; Refills: 0; What changed: medication strength, how much to   take     CONTINUE taking these medications     aspirin  EC 81 mg tablet; Commonly known as: ASPIR-LOW; Dose: 81 mg; Take   one tablet by mouth daily. Take with food.; Quantity: 90 tablet; Refills:   3   CALCIUM  PO; Dose: 1 tablet; Refills: 0   CHOLEcalciferoL  (vitamin D3) 50 mcg (2,000 unit) capsule; TAKE 1 CAPSULE   BY MOUTH EVERY DAY; Quantity: 90 capsule; Refills: 3   DEXCOM G7 SENSOR sensor device; Generic drug: blood-glucose sensor;   Dose: 1 each; Use one each as directed every 10 days. Indications: type 2   diabetes mellitus; For: type 2 diabetes mellitus; Quantity: 9 each;   Refills: 3   glucose 4 gram chewable tablet; Commonly known as: DEX4 GLUCOSE; Dose:   16 g; Chew four tablets by mouth as Needed. Indications: low blood sugar;   For: low blood sugar; Quantity: 50 tablet; Refills: 0   insulin  aspart (U-100) 100 unit/mL (3 mL) PEN; Commonly known as:   NOVOLOG  FLEXPEN U-100 INSULIN ; Dose: 45 Units; Inject 10 units   subcutaneously with breakfast, 8 units with lunch, and 10 units with   evening meal PLUS sliding scale If blood sugar 151-200: Take extra 2 unit.   If blood sugar 201-250: Take extra 4 units. If blood sugar 251-300: Take   extra 6 units. If blood sugar is 301-350: Take extra 8 units. If blood   sugars > 350: Take extra 10 units. Max daily dose 45 units.  Indications:   type 2 diabetes mellitus; For: type 2 diabetes mellitus; Quantity: 45 mL;   Refills: 1   ONETOUCH DELICA PLUS LANCET 33 gauge; Generic drug: lancets 33 gauge;   Dose: 1 each; Use one each as directed four times daily. ICD-10: Type II   Diabetes with unspecified complications; with long term insulin  use E11.8,   Z79.4; Quantity: 300 each; Refills: 3   ONETOUCH VERIO TEST STRIPS test strip; Generic drug: blood sugar   diagnostic; Dose: 1 strip; Use one strip as directed before meals and at   bedtime. ICD-10: Type II Diabetes with unspecified complications; with   long term insulin  use E11.8, Z79.4  Indications: type 2 diabetes mellitus;   For: type 2 diabetes mellitus; Quantity: 300 strip; Refills: 3   oxyBUTYnin  chloride 5 mg tablet; Commonly known as: DITROPAN ; Dose: 5   mg; Take one tablet by mouth twice daily as needed. Indications: a   condition where the urge to urinate results in urine leakage; For: a   condition where the urge to urinate results in urine leakage; Quantity: 60   tablet; Refills: 0   rosuvastatin  10 mg tablet; Commonly known as: CRESTOR ; Dose: 10 mg; Take   one tablet by mouth daily. Indications: excessive fat in the blood; For:   excessive fat in the blood; Quantity: 60 tablet; Refills: 0   ULTICARE PEN NEEDLE 32 gauge x 5/32 pen needle; Generic drug: pen   needle, diabetic; Dose: 1 each; Use one pen needle as directed as Needed.   Use with insulin  injections.; Quantity: 300 each; Refills: 3     STOP taking these medications     everolimus  (immunosuppressive)  1 mg tablet; Commonly known as: ZORTRESS    insulin  degludec 100 unit/mL (3 mL) subcutaneous PEN; Commonly known as:   TRESIBA FLEXTOUCH U-100       Return Appointments and Scheduled Appointments     Scheduled appointments:      May 08, 2024 9:45 AM  Lab Visit with CFT TRANSPLANT PT LAB  Laboratory: [facility] (--) [address]  [address], [address]  [place]  [place] [address]     May 08, 2024 10:40 AM  Follow-up visit with Aletha Bring, MD  Transplant: [facility] ([facility] [place]) [address]  [address]  [place]  [place] [address]  [phone]     May 10, 2024 9:30 AM  Telehealth visit with Ulanda JONELLE Melia, APRN-NP  Endocrinology: [facility] (Internal Medicine) [address]  [address], [address]  [place] [address]  [phone]     Jul 11, 2024 2:30 PM  Office visit with Alm DELENA Dec, MD  Urology: [facility] (Urology) [address]  [address], [address]  [place]  [place] [address]  [phone]     Jul 23, 2024 3:40 PM  (Arrive by 3:25 PM)  Telehealth visit with Al-Ola DELENA Lace, MD  Oncology: [facility] [place] [facility] Exam) [address] [facility].  Level [address]2  [place] [address]  [phone]     Aug 27, 2024 8:45 AM  Lab Visit with CFT TRANSPLANT PT LAB  Laboratory: [facility] (--) [address]  [address]  [place]  [place] [address]     Aug 27, 2024 9:40 AM  Office visit with Aletha Bring, MD  Transplant: [facility] ([facility] [place]) [address]  [address]  [place]  [place] [address]  [phone]          Contact information for after-discharge care                [facility]       [facility]    Phone: [phone]    Fax: [phone]    Where: [facility] [place]  [place] [address]    Service: In-Center Dialysis                  Consults, Procedures, Diagnostics, Micro, Pathology   Consults: Transplant  Surgical Procedures & Dates: as per hospital course  Significant Diagnostic Studies, Micro and Procedures: noted in brief hospital course  Significant Pathology: noted in brief hospital course     Wound:      Wounds Puncture Right;Lower Abdomen (Active)   04/23/24 0920   Wound Type: Puncture   Orientation: Right;Lower   Location: Abdomen   Wound Location Comments:    Initial Wound Site Closure:    Initial Dressing Placed: Transparent (i.e. Tegaderm);Gauze   Initial Cycle:    Initial Suction Setting (mmHg):    Pressure Injury Stages:    Pressure Injury Present Within 24 Hours of Hospital Admission:    If This Pressure Injury Is Suspected to Be Device Related, Please Select the Device::    Is the Wound Open or Closed:    Wound Assessment Pale 04/29/24 2149   Peri-wound Assessment Dry;Intact;Pink 04/29/24 2149   Wound Drainage Amount None 04/29/24 2149   Wound Dressing Status Intact 04/29/24 2149   Number of days: 7       Wounds Puncture Anterior;Right Neck (Active)   04/25/24 1546   Wound Type: Puncture   Orientation: Anterior;Right  Location: Neck   Wound Location Comments: HD Line Tunnel Site   Initial Wound Site Closure:    Initial Dressing Placed:    Initial Cycle:    Initial Suction Setting (mmHg):    Pressure Injury Stages:    Pressure Injury Present Within 24 Hours of Hospital Admission:    If This Pressure Injury Is Suspected to Be Device Related, Please Select the Device::    Is the Wound Open or Closed:    Wound Assessment Pale 04/29/24 2149   Peri-wound Assessment Dry;Intact;Pink 04/29/24 2149   Wound Drainage Amount None 04/29/24 2149   Wound Dressing Status Intact 04/29/24 2149   Wound Care Dressing changed or new application 04/25/24 1600   Wound Dressing and/or Treatment Primapore 04/28/24 0900   Dressing Change Due 04/26/24 04/27/24 0800   Number of days: 5                        Discharge Disposition, Condition   Patient Disposition: Home or Self Care [01]  Condition at Discharge: Stable    Code Status   Full Code    Patient Instructions     Activity       Activity as Tolerated   As directed      It is important to keep increasing your activity level after you leave the hospital.  Moving around can help prevent blood clots, lung infection (pneumonia) and other problems.  Gradually increasing the number of times you are up moving around will help you return to your normal activity level more quickly.  Continue to increase the number of times you are up to the chair and walking daily to return to your normal activity level. Begin to work toward your normal activity level at discharge          Diet       Regular Diet   As directed      You have no dietary restriction. Please continue with a healthy balanced diet.   Additional instructions:    Please don't hesitate to contact a registered dietitian if needed:   Contact information:  Julien Moulder, MS, RD, LD, CNSC  Phone: [phone]  [email or web address]              Discharge education provided to patient.    Additional Orders: Case Management, Supplies, Home Health     Home Health/DME       None            Discharge Attending Time: I, the attending, spent greater than 30 minutes in counseling pt, coordinating discharge care, placing discharge orders and complete discharge summary.    Signed:  Armida MARLA Repress, MD  04/30/2024      cc:  Primary Care Physician:  Francis Lew   Verified    Referring physicians:  Unknown, Unknown, MD   Additional provider(s):        Did we miss something? If additional records are needed, please fax a request on office letterhead to [phone]. Please include the patient's name, date of birth, fax number and type of information needed. Additional request can be made by email at [email or web address]. For general questions of information about electronic records sharing, call [phone].

## 2024-04-30 NOTE — Discharge Planning (AHS/AVS)
 Hemodialysis    Clinic name: [facility]  Phone: [phone]  Address: [address] [place], [place] [address]  Chair time: Mondays, Wednesdays, and Fridays @ 2:45pm  Start date: Wednesday October 1st, 2025    Your chair time and clinic information are listed above. Your first outpatient dialysis treatment will be on Wednesday October 1st at 2:45 pm. For your first appointment please arrive 15-30 minutes early to allow for intake paperwork.

## 2024-04-30 NOTE — Case Management (ED)
 Case Management Progress Note    NAME:Jacqueline Shields                          MRN: [id number]              DOB:1965/10/27          AGE: 58 y.o.  ADMISSION DATE: 04/21/2024             DAYS ADMITTED: LOS: 9 days      Today's Date: 04/30/2024    PLAN: Patient discharged to home today. Family will provide transportation.     Expected Discharge Date: 04/30/2024   Is Patient Medically Stable: Yes   Are there Barriers to Discharge? no    INTERVENTION/DISPOSITION:  Discharge Planning              Discharge Planning: OP HD or PD (Confirming chair time [facility]nd shift, [place])  NCM confirmed today's discharge with [facility] rep, Matt, start of care 10/01 2:45 MWF.   NCM sent all pertinent documents to [facility].  No additional NCM needs identified at this time.  Transportation              Does the Patient Need Case Management to Arrange Discharge Transport? (ex: facility, ambulance, wheelchair/stretcher, Medicaid, cab, other): No  Will the Patient Use Family Transport?: Yes  Transportation Name, Phone and Availability #1: Husband- Roger  Support              Support: Pt/Family Updates re:POC or DC Plan  NCM reviewed chart.   NCM attended and participated in huddle Med Private S.  Info or Referral              Information or Referral to Community Resources: No Needs Identified  Positive SDOH Domains and Potential Barriers      Medication Needs                           Financial                 Legal                 Other                 Discharge Disposition                                                                                                                                                      Selected Continued Care - Admitted Since 04/21/2024       [facility] Coordination complete.      Service Provider Services Address Phone Fax Patient Preferred    [facility]  In-Center Dialysis [facility] [place]  [place] [address] [phone] --  [facility]  In-Center Dialysis [address] [place], [place] [address] [phone] --                          Randall Her BSN, RN  Integrated Nurse Case Manager  [phone]  [place]

## 2024-05-01 ENCOUNTER — Encounter: Admit: 2024-05-01 | Discharge: 2024-05-01 | Payer: PRIVATE HEALTH INSURANCE

## 2024-05-01 NOTE — Telephone Encounter
 New Referral Intake    Re-referred? Have they met all criteria to re-refer? 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 Had transplant at [facility] in 2021    Is English your Native/First language? yes   If no, do you or your support person need an interpreter?      (HARD STOPS)  *Do you have a support person who will be with you before, during, and after a transplant?yes        Name, Relationship and contact number (or e-mail) of Support Person: husband, Sharolyn, sister, Macario, both are able to drive  (over 22 yoa, can/will drive to [place](city) & lives locally to you?)  *Would you accept a lifesaving blood transfusion? yes (IF NO-stop!)  *Open wounds or sores? no  (IF YES- have they been diagnosed with Diabetes?, IF YES-stop!)  *Any active infections? no  , On Oral or IV antibiotics? no  *Do you live in a Nursing Facility? no (If YES, at what level? Independent Living, Assisted Living or Skilled/Rehab?)      *Primary Insurance Company: [facility]     Policy #: [id number]     Group ID: [id number]    Secondary Insurance Company:      Policy #:      Group ID:     Who is your (Nephrologist) kidney doctor?  Dr CHRISTELLA. Charlanne  Who is your (PCP) primary care provider (MD/NP/PA-C)?  Dr Francis    Have you had a hospitalization in the past 6 months?  yes If YES, ask what hospital, what date and why? At [place], just got out yesterday, was in for a week. Kidney Failure    Past Medical History:  (if answer is yes, please specify)    How tall are you (in inches)?  5'8  How much do you weigh (in pounds)? 190 lbs  Calculated BMI:  29.9    What is the cause of your kidney disease?  Not sure this time (Examples: DM, HTN, IgA, PKD, etc)  Have you ever had a kidney biopsy? yes  If yes, where/when? At [facility] within the last week    Have you ever been diagnosed with Diabetes?  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 Use to see Randine Mage, NP and don't know the name of the new onel  (IF Type 1, BMI is 30 or below, diagnosed under 15 yoa and current age is under 60--> ask if interested in [facility])     Are you currently on dialysis?  yes (If No, skip the next 4 questions)   Type/ Start date (chronic)/ Schedule/ Time:  hemo, MWF, 04/26/2024  IF started more than 6 months ago ask, Why the delay being referred to Transplant?     What center: [facility]                 Do you receive [employer] ([employer]) premium assistance? no  If yes, how much per month?                 Have you been denied or listed by another transplant center? no IF denied, why?   Are you currently working with any other transplant centers to become listed? no  Have you  had a previous organ transplant? yes  If YES, when and where? Kidney txp at [facility] 2021    Are you up-to-date on your age-appropriate Vaccinations? yes (Influenza, Pneumonia, Tetanus, Shingles, Hepatitis B & Covid-19)  HIGHLY RECOMMENDED  Are you up-to-date on your age appropriate Cancer Screenings? No  [Women age 46- Pap smear- good for 5 years if NML (unless Hysterectomy); Women age 3- annual Mammogram;   Men age 49- annual PSA; All age 48- Colonoscopy (Cologuard is not acceptable)]    Nutrition: Recent weight loss without trying? yes, If yes, how many pounds in what timeframe?   20 lbs  in 2 months                  Eating poorly due to decreased appetite? no                  Are you on a GLP1 medication? no  (Dulaglutide  (Trulicity ?), Exenatide (Byetta?), Exenatide extended-release [Bydureon?), Liraglutide (Victoza?), Lixisenatide (Adlyxin?), Semaglutide injection (Ozempic?), Semaglutide tablets (Rybelsus?) or  tirzepatide  (Zepbound or Mounjaro ?)]    History of:  Heart disease?  no If yes, do you have a heart doctor?      History of heart stents?  no  Other stents (legs)?: no     Have you had any cardiac testing within the last year? EKG at doctor in [place] (ECG, Stress, Echo, Cath)       Are you on medication to keep your blood thin (ex. Coumadin, Plavix, Brilinta- do not include Heparin  for dialysis)? aspirin      Are you on medication to help raise your blood pressure (Midodrine/Florinef)? no     Lung disease? no, If yes, do you see a Pulmonologist?      Are you on oxygen? no  If Yes, how many liters?  with dialysis or all the time?      Do you wear CPAP (breathing machine at night to help you sleep)? no                    Do you use tobacco or nicotine products or have you in the past? no  (this includes vaping and patches)   Packs/cans per day?  for years?   When did you quit?    (TO PATIENT: If you currently use tobacco or nicotine products, you need to quit to be considered a transplant candidate.)                 Do you use any marijuana products ? no  (IF smoking will need to change to edibles)    Do you use illegal substances or have you in the past? no     Do you take narcotics or controlled substances for pain control? no    Do you drink alcohol? no (IF yes, how many drinks per day?  )                 Liver disease?  no                            If YES, what kind, when diagnosed?   History of HIV or Hepatitis? no    If yes, who treated and where?   Cancer? no     If yes, what kind, when diagnosed, where treated?     Psychiatric illness (Depression/Anxiety/Bipolar Disorder)?  no    Do you see a psychiatrist or counselor for any  reason?  no                 Do you use a cane/walker/wheelchair to get around? no (IF yes, are you receiving therapy services ?)    Past Surgical History:  Have you had any amputations?  no   Have you had any abdominal surgeries? Transplant, PD Cath, c-section, tubal and hysterectomy  Have you had a CAT scan of your abdomen/pelvis recently? yes  IF so, where/when? [facility], in the 2 months  Have you had any surgeries on blood vessels (not including a fistula)? no     Demographic and Social History:  (Please do not turn these into a leading questions, ask them as they are written)  What gender were you assigned at birth (female or female)?  female  [For Surgical Verification]  What race do you identify with (1- Hispanic/Non-Hispanic, 2- Native American/Native [place], Black, Native [place] or other [place] Islander, Asian or Teresa- includes Hispanic)? white  [For Tissue-typing purposes]      Has anyone shown interest in being your living donor?  (Not applicable with SHK/SLK) no   If so, they are welcome to come to evaluation with you (up to two persons)  If no, refer to BIG ASK/BIG GIVE as a resource at [email or web address]    If patient has possible living donor - have them call:  Darris - [phone] #2  Lucie, RN - [phone] #2   Alpha: KRYSTAL Leader, RN - [phone] #2 Alpha: N-Z    If not already in [email or web address] may need you to sign a Release of Information Authorization Form. Do you have access to a fax machine or an e-mail address we can send it to (you will not receive junk mail)? My Chart     *IF ANY OF THIS INFORMATION CHANGES BEFORE WE POTENTIALLY BRING YOU IN FOR EVALUATION, PLEASE LET US  KNOW ASAP! (SUPPORT, WOUNDS, CHANGE IN FUNCTIONAL STATUS, PENDING SURGERIES, INSURANCE CHANGES, RECENT HOSPITALIZATIONS, ETC)     **After reviewing all available records, our Referral Nurse will send the Providers a list of items that will need to be addressed. They DO NOT need to be completed before being evaluated but will be required before being considered for Listing.

## 2024-05-01 NOTE — Telephone Encounter
 Patient admitted 04/21/24 and discharged 04/30/24. 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 Eventually required renal replacement therapy and placement of a tunneled HD catheter. Was able to tolerate 3 sessions of dialysis during admission and had outpatient dialysis set up with plan for continued outpatient following in the setting of AKI and renal transplant failure.     Discharged to OP Dialysis at [facility] in [place], chair time 2:45 MWF, start 05/01/24.    Discharge IS:  Prograf  0.5 mg bid  Everolimus  dc'd    Patient has been referred for transplant re-evaluation.

## 2024-05-02 ENCOUNTER — Encounter: Admit: 2024-05-02 | Discharge: 2024-05-02 | Payer: PRIVATE HEALTH INSURANCE

## 2024-05-03 ENCOUNTER — Encounter: Admit: 2024-05-03 | Discharge: 2024-05-03 | Payer: PRIVATE HEALTH INSURANCE

## 2024-05-06 ENCOUNTER — Encounter: Admit: 2024-05-06 | Discharge: 2024-05-06 | Payer: PRIVATE HEALTH INSURANCE

## 2024-05-06 NOTE — Progress Notes
 Hospitalized 04/21/24-04/30/24 for metabolic acidosis, AKI, UTI. 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 Eventually required renal replacement therapy and placement of a tunneled HD catheter. Was still making urine through admission. Immunosuppression was ulcerated to Prograf  0.5 mg twice daily everolimus  was discontinued.     Plan/Visit:  MWF dialysis at [facility]  Completed kidney transplant re-referral intake on 10/1    OK to stay on prograf  0.5 mg bid for IS.    DC from post clinic

## 2024-05-06 NOTE — Progress Notes
 Contacted Mickel LITTIE Gay to refill their medication(s) ZORTRESS  1 MG PO TAB.    Patient declined a refill because      [employer]  [phone]

## 2024-05-07 ENCOUNTER — Encounter: Admit: 2024-05-07 | Discharge: 2024-05-07 | Payer: PRIVATE HEALTH INSURANCE

## 2024-05-07 NOTE — Progress Notes
 Reviewed records in O2/CE. Moved to Financial. Prelim Needs Letter e-faxed to providers.

## 2024-05-08 ENCOUNTER — Ambulatory Visit: Admit: 2024-05-08 | Discharge: 2024-05-08 | Payer: PRIVATE HEALTH INSURANCE

## 2024-05-08 ENCOUNTER — Encounter: Admit: 2024-05-08 | Discharge: 2024-05-08 | Payer: PRIVATE HEALTH INSURANCE

## 2024-05-08 DIAGNOSIS — D849 Immunodeficiency, unspecified: Principal | ICD-10-CM

## 2024-05-08 MED ORDER — TACROLIMUS 0.5 MG PO CAP
.5 mg | ORAL_CAPSULE | Freq: Two times a day (BID) | ORAL | 3 refills | Status: CN
Start: 2024-05-08 — End: ?

## 2024-05-08 NOTE — Progress Notes
 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 On PD Jan 2019  DDRT  KPDI 92%, cPRA 0%  Physical Crossmatch B cell negative T cell negative  CMV D -/ R -  Induction: Thymo  Maintenance IS: Dual  IS   PD not removed with surgery   Post-op Complications:  - None    Referring Nephrologist:  Marinda Eaton  [address]  [place]   [place] [address]  Phone: [phone]  Fax: [phone]     Dear Dr. Marinda Eaton,    Jacqueline Shields is a 58 year old female with a history of kidney transplant who presents for medication management related to dialysis care. She is accompanied by her sister. She is currently undergoing dialysis on a schedule of Monday, W, F. Her blood sugar levels have been high, around 400 mg/dL, even without eating much. She was previously on Mounjaro , which helped control her blood sugar levels.    She is currently taking tacrolimus , one pill twice a day at 0.5 mg, and aspirin . She is not on prednisone , and there was a discussion about whether she was discharged on steroids, but she confirms she was not. She recalls being on everolimus , which was stopped during her last hospital stay.    She has completed her pre-evaluation interview for a transplant and is awaiting further scheduling. She is interested in transitioning to peritoneal dialysis and is in the process of having her paperwork reviewed for this change. She is currently using a dialysis access and is waiting for clearance from infectious disease specialists before proceeding with a peritoneal dialysis catheter placement.    No itching and reports minimal swelling. She is not aware of her current kidney doctor but knows the nurse practitioner, Niels, involved in her care.    REVIEW OF SYSTEMS: Comprehensive 14-point ROS reviewed  Positives noted in HPI otherwise negative.    Past History:  Past Medical History:    Abnormal biopsy of kidney    Anemia    Chronic kidney disease    Coronary artery disease due to lipid rich plaque    DM (diabetes mellitus), type 2 (CMS-HCC)    ESRD (end stage renal disease) (CMS-HCC)    Gastroparesis    GERD (gastroesophageal reflux disease)    H/O kidney transplant    HLD (hyperlipidemia)    Hypertension    Kidney failure    Kidney stones    MGUS (monoclonal gammopathy of unknown significance)    Obesity    Proteinuria    PUD (peptic ulcer disease)    PVD (peripheral vascular disease)    Urinary tract infection       Surgical History:   Procedure Laterality Date    ANGIOGRAPHY CORONARY ARTERY WITH LEFT HEART CATHETERIZATION N/A 07/03/2020    Performed by Theodosia Bahena, Kamal, MD at [facility] CATH LAB    POSSIBLE PERCUTANEOUS CORONARY STENT PLACEMENT WITH ANGIOPLASTY N/A 07/03/2020    Performed by Charlanne Francois, MD at [facility] CATH LAB    ALLOTRANSPLANTATION KIDNEY FROM NON LIVING DONOR WITHOUT RECIPIENT NEPHRECTOMY N/A 07/15/2020    Performed by Terrie Sieving, MD at [facility] OR    REMOVAL TUNNELED INTRAPERITONEAL CATHETER Bilateral 08/24/2020    Performed by Terrie Sieving, MD at [facility] OR  CYSTOURETHROSCOPY WITH REMOVAL FOREIGN BODY/ CALCULUS/ STENT FROM URETHRA/ BLADDER - SIMPLE Right 08/24/2020    Performed by Carolynn Ruther ORN, MD at [facility] OR    ESOPHAGOGASTRODUODENOSCOPY WITH BIOPSY - FLEXIBLE N/A 04/17/2023    Performed by Chipper Neas, MD at [facility] ENDO    COLONOSCOPY WITH BIOPSY - FLEXIBLE N/A 04/17/2023    Performed by Chipper Neas, MD at [facility] ENDO    ESOPHAGOGASTRODUODENOSCOPY WITH SPECIMEN COLLECTION BY BRUSHING/ WASHING N/A 07/21/2023    Performed by Arlan Nancyann PARAS, MD at [facility] ENDO    ANTEGRADE UROGRAPHY Right 09/22/2023    Performed by Erskin Alm LABOR, MD at [facility] OR    RETROGRADE UROGRAPHY WITH/ WITHOUT KUB Right 09/22/2023    Performed by Erskin Alm LABOR, MD at [facility] OR    REMOVAL, NEPHROSTOMY TUBE, WITH FLUOROSCOPIC GUIDANCE Right 09/22/2023    Performed by Erskin Alm LABOR, MD at [facility] OR    CYSTOURETHROSCOPY WITH URETEROSCOPY WITH TREATMENT URETERAL STRICTURE Right 09/22/2023    Performed by Erskin Alm LABOR, MD at [facility] OR    CYSTOURETHROSCOPY WITH URETEROSCOPY AND/ OR PYELOSCOPY - DIAGNOSTIC Right 11/17/2023    Performed by Erskin Alm LABOR, MD at [facility] OR    RETROGRADE UROGRAPHY WITH/ WITHOUT KUB Right 11/17/2023    Performed by Erskin Alm LABOR, MD at [facility] OR    CYSTOURETHROSCOPY WITH REMOVAL FOREIGN BODY/ CALCULUS/ STENT FROM URETHRA/ BLADDER - SIMPLE Right 11/17/2023    Performed by Erskin Alm LABOR, MD at [facility] OR    CATHETER IMPLANT/REVISION      PD cath    CESAREAN SECTION  1991    HX CESAREAN SECTION  1991    HX HYSTERECTOMY  2000    still has ovaries    HX LITHOTRIPSY      HX TUBAL LIGATION  1995       Social History     Socioeconomic History    Marital status: Married     Spouse name: Roger    Number of children: 2   Tobacco Use    Smoking status: Never    Smokeless tobacco: Never   Vaping Use    Vaping status: Never Used   Substance and Sexual Activity    Alcohol use: Not Currently    Drug use: Never    Sexual activity: Not Currently     Partners: Male     Birth control/protection: None   Social History Narrative    Lives in [place]. Works at [employer], puts labels on bottles.       Family History   Problem Relation Name Age of Onset    Diabetes Mother Mom     Hypertension Mother Mom     Diabetes Father Lukemia     Hypertension Father Lukemia     Cancer-Hematologic Father Lukemia     Cancer Father Lukemia     Diabetes Sister Macario     Migraines Sister Macario     Cancer Sister Macario         Endometrial       No Known Allergies    Current Medications:    Current Outpatient Medications:     aspirin  EC (ASPIR-LOW) 81 mg tablet, Take one tablet by mouth daily. Take with food., Disp: 90 tablet, Rfl: 3    blood sugar diagnostic (ONETOUCH VERIO TEST STRIPS) test strip, Use one strip as directed before meals and at bedtime. ICD-10: Type II Diabetes with unspecified complications; with long term insulin  use E11.8, Z79.4  Indications:  type 2 diabetes mellitus, Disp: 300 strip, Rfl: 3    CALCIUM  PO, Take 1 tablet by mouth daily., Disp: , Rfl:     CHOLEcalciferoL  (vitamin D3) 50 mcg (2,000 unit) capsule, TAKE 1 CAPSULE BY MOUTH EVERY DAY, Disp: 90 capsule, Rfl: 3    DEXCOM G7 SENSOR sensor device, Use one each as directed every 10 days. Indications: type 2 diabetes mellitus, Disp: 9 each, Rfl: 3    ferrous sulfate  (FEOSOL) 325 mg (65 mg iron ) tablet, Take one tablet by mouth at bedtime daily. Take on an empty stomach at least 1 hour before or 2 hours after food. (Patient taking differently: Take one tablet by mouth daily before breakfast. Take on an empty stomach at least 1 hour before or 2 hours after food.), Disp: 30 tablet, Rfl: 3    glucose (DEX4 GLUCOSE) 4 gram chewable tablet, Chew four tablets by mouth as Needed. Indications: low blood sugar, Disp: 50 tablet, Rfl: 0    insulin  aspart (U-100) (NOVOLOG  FLEXPEN U-100 INSULIN ) 100 unit/mL (3 mL) PEN, Inject 10 units subcutaneously with breakfast, 8 units with lunch, and 10 units with evening meal PLUS sliding scale If blood sugar 151-200: Take extra 2 unit. If blood sugar 201-250: Take extra 4 units. If blood sugar 251-300: Take extra 6 units. If blood sugar is 301-350: Take extra 8 units. If blood sugars > 350: Take extra 10 units. Max daily dose 45 units.  Indications: type 2 diabetes mellitus, Disp: 45 mL, Rfl: 1    insulin  glargine (LANTUS  SOLOSTAR U-100 INSULIN ) 100 unit/mL (3 mL) subcutaneous PEN, Inject fifty Units under the skin at bedtime daily. Indications: type 2 diabetes mellitus, Disp: 60 mL, Rfl: 3    lancets 33 gauge (ONETOUCH DELICA 33 GUAGE) 33 gauge, Use one each as directed four times daily. ICD-10: Type II Diabetes with unspecified complications; with long term insulin  use E11.8, Z79.4, Disp: 300 each, Rfl: 3    loperamide  (IMODIUM  A-D) 2 mg capsule, Take one capsule by mouth three times daily as needed for Diarrhea. Indications: diarrhea, Disp: 30 capsule, Rfl: 0    oxyBUTYnin  chloride (DITROPAN ) 5 mg tablet, Take one tablet by mouth twice daily as needed. Indications: a condition where the urge to urinate results in urine leakage, Disp: 60 tablet, Rfl: 0    pen needle, diabetic (BD ULTRA-FINE NANO PEN NEEDLE) 32 gauge x 5/32 pen needle, Use one pen needle as directed as Needed. Use with insulin  injections., Disp: 300 each, Rfl: 3    rosuvastatin  (CRESTOR ) 10 mg tablet, Take one tablet by mouth daily. Indications: excessive fat in the blood, Disp: 60 tablet, Rfl: 0    tacrolimus  (PROGRAF ) 0.5 mg capsule, Take one capsule by mouth twice daily. Indications: prevent kidney transplant rejection, Z94.0, Disp: 180 capsule, Rfl: 3    [Paused] tirzepatide  (MOUNJARO ) 7.5 mg/0.5 mL injector PEN, Inject 0.5 mL under the skin every 7 days. Indications: type 2 diabetes mellitus, Disp: 6 mL, Rfl: 3    Current Facility-Administered Medications:     darbepoetin alfa  (ARANESP ) (*) injection 100 mcg, 100 mcg, Subcutaneous, Q7 Days, Miranda Frese, MD, 100 mcg at 04/19/24 1101    Physical Exam:  Vitals:    05/08/24 1108 05/08/24 1110   BP: 114/69 103/58   BP Source: Arm, Right Upper Arm, Right Upper   Pulse: 77 88   Temp:  36.7 ?C (98 ?F)   SpO2: 100%    TempSrc:  Oral   PainSc: Zero    Weight: 90 kg (  198 lb 6.4 oz)    Height: 172.7 cm (5' 8)      Body mass index is 30.17 kg/m?SABRA   General: In NAD; A&Ox3  Skin: Warm, dry, no signs of rash   Neck: Supple   CV: Regular, regular rate, normal S1/S2, no murmurs  Lungs: CTA bilaterally in posterior fields  Abd: Grossly normal without rebound, guarding, masses, or bruits   Ext: no edema  Neuro: No focal deficits   Psych: Affect appropriate  Right chest HD cath     Laboratory studies:     CMP:      Latest Ref Rng & Units 05/08/2024     9:58 AM 04/30/2024     5:48 AM 04/29/2024     5:27 AM 04/28/2024     4:00 AM 04/27/2024     4:33 AM   CMP Sodium 137 - 147 mmol/L 143  139  134  139  138    Potassium 3.5 - 5.1 mmol/L 4.2  3.6  3.2  3.1  3.2    Chloride 98 - 110 mmol/L 105  102  101  103  101    CO2 21 - 30 mmol/L 30  28  21  27  25     Anion Gap 3 - 12 8  9  12  9  12     Blood Urea Nitrogen 7 - 25 mg/dL 19  26  35  25  41    Creatinine 0.40 - 1.00 mg/dL 4.71  5.88  4.92  5.99  5.72    Glucose 70 - 100 mg/dL 782  835  741  821  765    Calcium  8.5 - 10.6 mg/dL 8.4  8.3  8.4  8.4  8.4    Total Protein 6.0 - 8.0 g/dL 7.3  6.4  7.1  7.1  7.2    Albumin 3.5 - 5.0 g/dL 3.5  3.3  3.7  3.6  3.5    Alk Phosphatase 25 - 110 U/L 71  54  63  64  66    ALT (SGPT) 7 - 56 U/L 29  21  24  17  15     AST 7 - 40 U/L 25  22  29  20  18     Total Bilirubin 0.2 - 1.3 mg/dL 0.4  0.4  0.4  0.3  0.3    GFR >60 mL/min 9  12  9  12  8       Hemoglobin A1C (%)   Date Value   04/21/2024 6.3 (H)   01/19/2024 7.2 (H)   04/13/2023 11.1 (H)   10/30/2021 11.6 (H)   05/29/2021 10.8 (H)     PTH Hormone   Date Value   01/19/2024 176.2 pg/mL (H)   10/03/2023 116.6 pg/mL (H)   04/25/2023 151.5 PG/ML (H)   10/30/2021 195.9 PG/ML (H)     No results found for: LIPASE  No results found for: AMY  BK Virus Plasma Quant (no units)   Date Value   04/14/2023 BK Virus Not Detected   10/30/2021     NOT DETECTED  Reference range: NOT DETECTED  Unit: IU/mL  .  Assay Range: 33 IU/mL to 3.30E+08 IU/mL  .  One IU is equal to 0.33 copies of BKV.  .  The limit of quantitation (LOQ) is 33 IU/mL. BK virus DNA detected  below the LOQ  will be reported as Detected:<33 IU/mL.  .  This test was developed and its performance characteristics  determined by  [facility]. It has not been cleared or approved by the U.S.  Food and Drug  Administration. Results should be used in conjunction with clinical  findings,  and should not form the sole basis for a diagnosis or treatment  decision.  ____________________________________________________________  Testing Performed At:  [facility]  [address]  [place], [place] [address]  Laboratory Director: Lucious Betters Ph.D., BCLD (ABB)  CLIA#: [id number]  Phone: [phone]     10/02/2021     NOT DETECTED  Reference range: NOT DETECTED  Unit: IU/mL  TESTING PERFORMED AT LOW VOLUME ON PLASMA SPECIMEN FOR BKV qPCR, MAY  AFFECT  RESULTS.  SABRA  Assay Range: 33 IU/mL to 3.30E+08 IU/mL  .  One IU is equal to 0.33 copies of BKV.  .  The limit of quantitation (LOQ) is 33 IU/mL. BK virus DNA detected  below the LOQ  will be reported as Detected:<33 IU/mL.  .  This test was developed and its performance characteristics  determined by  [facility]. It has not been cleared or approved by the U.S.  Food and Drug  Administration. Results should be used in conjunction with clinical  findings,  and should not form the sole basis for a diagnosis or treatment  decision.  ____________________________________________________________  Testing Performed At:  [facility]  [address]  [place], [place] [address]  Laboratory Director: Lucious Betters Ph.D., BCLD (ABB)  CLIA#: [id number]  Phone: [phone]     09/04/2021     NOT DETECTED  Reference range: NOT DETECTED  Unit: IU/mL  .  Assay Range: 33 IU/mL to 3.30E+08 IU/mL  .  One IU is equal to 0.33 copies of BKV.  .  The limit of quantitation (LOQ) is 33 IU/mL. BK virus DNA detected  below the LOQ  will be reported as Detected:<33 IU/mL.  .  This test was developed and its performance characteristics  determined by  [facility]. It has not been cleared or approved by the U.S.  Food and Drug  Administration. Results should be used in conjunction with clinical  findings,  and should not form the sole basis for a diagnosis or treatment  decision.  ____________________________________________________________  Testing Performed At:  [facility]  [address]  [place], [place] [address]  Laboratory Director: Lucious Betters Ph.D., BCLD (ABB)  CLIA#: [id number]  Phone: [phone]     08/03/2021     NOT DETECTED  Reference range: NOT DETECTED  Unit: IU/mL  .  Assay Range: 33 IU/mL to 3.30E+08 IU/mL  .  One IU is equal to 0.33 copies of BKV.  .  The limit of quantitation (LOQ) is 33 IU/mL. BK virus DNA detected  below the LOQ  will be reported as Detected:<33 IU/mL.  .  This test was developed and its performance characteristics  determined by  [facility]. It has not been cleared or approved by the U.S.  Food and Drug  Administration. Results should be used in conjunction with clinical  findings,  and should not form the sole basis for a diagnosis or treatment  decision.  ____________________________________________________________  Testing Performed At:  [facility]  [address]  [place], [place] [address]  Laboratory Director: Lucious Betters Ph.D., BCLD (ABB)  CLIA#: [id number]  Phone: [phone]       BK Virus Plasma (no units)   Date Value   05/08/2024 Not Detected   04/19/2024 Not Detected   03/29/2024 Not Detected   03/08/2024 Not  Detected   01/19/2024 Not Detected     CMV DNA Quant PCR ([IU]/mL)   Date Value   04/14/2023 CMV DNA NOT DETECTED   12/04/2021 CMV DNA NOT DETECTED   10/30/2021 CMV DNA NOT DETECTED   10/02/2021 CMV DNA NOT DETECTED   09/04/2021 CMV DNA NOT DETECTED     IU/mL CMV Blood (no units)   Date Value   12/04/2021     <50 IU/mL  The test method detects and quantitates CMV DNA using the Abbott RealTime assay,   and is approved by the FDA for monitoring hematopoietic stem cell transplant   patients who are undergoing anti-CMV therapy.  Please correlate results with the   clinical status of the patient.     10/30/2021     <50 IU/mL  The test method detects and quantitates CMV DNA using the Abbott RealTime assay,   and is approved by the FDA for monitoring hematopoietic stem cell transplant   patients who are undergoing anti-CMV therapy.  Please correlate results with the   clinical status of the patient.     10/02/2021     <50 IU/mL  The test method detects and quantitates CMV DNA using the Abbott RealTime assay, and is approved by the FDA for monitoring hematopoietic stem cell transplant   patients who are undergoing anti-CMV therapy.  Please correlate results with the   clinical status of the patient.     09/04/2021     <50 IU/mL  The test method detects and quantitates CMV DNA using the Abbott RealTime assay,   and is approved by the FDA for monitoring hematopoietic stem cell transplant   patients who are undergoing anti-CMV therapy.  Please correlate results with the   clinical status of the patient.     08/03/2021     <50 IU/mL  The test method detects and quantitates CMV DNA using the Abbott RealTime assay,   and is approved by the FDA for monitoring hematopoietic stem cell transplant   patients who are undergoing anti-CMV therapy.  Please correlate results with the   clinical status of the patient.       No results found for: COPIES  EBV DNA, Quant. (no units)   Date Value   04/14/2023 EBV DNA Not Detected       TACROLIMUS  LEVEL:  Tacrolimus  Immunoassay (ng/mL)   Date Value   05/08/2024 2.0 (LL)   04/30/2024 2.7 (LL)   04/29/2024 4.0 (L)   04/28/2024 4.4 (L)   04/27/2024 5.1   04/26/2024 6.6   04/25/2024 8.8   04/24/2024 9.3   04/25/2023 7.3   04/21/2023 5.1   04/20/2023 6.1   04/19/2023 5.7   04/18/2023 4.9 (L)   04/17/2023 5.1   04/16/2023 10.0   04/15/2023 8.7       CBC with Diff:      Latest Ref Rng & Units 05/08/2024     9:58 AM 04/30/2024     5:48 AM   CBC with Diff   WBC 4.50 - 11.00 10*3/uL 7.30  9.80    RBC 4.00 - 5.00 10*6/uL 3.35  3.52    Hemoglobin 12.0 - 15.0 g/dL 9.1  9.2    Hematocrit 36.0 - 45.0 % 27.4  27.7    MCV 80.0 - 100.0 fL 81.7  78.6    MCH 26.0 - 34.0 pg 27.1  26.2    MCHC 32.0 - 36.0 g/dL 66.7  66.5    RDW 88.9 - 15.0 % 19.2  17.7    Platelet Count 150 - 400 10*3/uL 220  176    MPV 7.0 - 11.0 fL 9.0  9.0    Neurtrophils 41.0 - 77.0 % 76.2     Absolute Neutrophils 1.80 - 7.00 10*3/uL 5.50  7.2    Lymphocytes 24 - 44 %  13    Absolute Lymph Count 1.00 - 4.80 10*3/uL 0.90     Monocytes 4 - 12 % 8    Absolute Monocyte Count 0.00 - 0.80 10*3/uL 0.60     Eosinophils 0.0 - 5.0 % 2.2     Absolute Eosinophil Count 0.00 - 0.45 10*3/uL 0.20     Basophils 0.0 - 2.0 % 0.8       Lab Results   Component Value Date/Time    IRON  20 (L) 03/08/2024 10:33 AM    TIBC 201 (L) 03/08/2024 10:33 AM    PSAT 10 (L) 03/08/2024 10:33 AM    FERRITIN 398 (H) 03/08/2024 10:33 AM    FERRITIN 327 (H) 01/19/2024 12:17 PM       Urinalysis:  Lab Results   Component Value Date/Time    UCOLOR Yellow 05/08/2024 10:08 AM    TURBID 2+ (A) 05/08/2024 10:08 AM    USPGR 1.008 05/08/2024 10:08 AM    UPH 8.0 05/08/2024 10:08 AM    UPROTEIN 2+ (A) 05/08/2024 10:08 AM    UAGLU 3+ (A) 05/08/2024 10:08 AM    UKET Negative 05/08/2024 10:08 AM    UBILE Negative 05/08/2024 10:08 AM    UBLD 1+ (A) 05/08/2024 10:08 AM    UROB Normal 05/08/2024 10:08 AM     Protein/CR ratio (no units)   Date Value   05/08/2024 1.58 (H)   04/19/2024 2.13 (H)   03/29/2024 0.72 (H)   03/08/2024 1.12 (H)   01/19/2024 0.76 (H)   04/13/2023 1.7 (H)   01/08/2022 0.7 (H)   12/04/2021 0.7 (H)   10/30/2021 0.7 (H)   10/02/2021 0.5 (H)       Imaging:  Results for orders placed during the hospital encounter of 04/13/23    CT ABD/PELV WO CONTRAST    Impression  1. Right lower quadrant renal transplant. Persistent hydronephrosis. Perinephric and periureteral stranding is nonspecific and may be from infection or inflammation.    2. Decompressed urinary bladder about a Foley catheter. Small gas in the bladder lumen, likely from the presence of the catheter. Diffuse bladder wall thickening and perivesicular stranding, nonspecific but may be from cystitis.    3. Small to upper limits normal size retroperitoneal and pelvic lymph nodes, indeterminate but may be reactive. Early lymphoproliferative process such as post transplant lymphoproliferative disorder could potentially have this appearance. Short interval follow-up CT abdomen pelvis in 3 months recommended to evaluate for stability.    4. Mild native renal atrophy. Subcentimeter nonobstructing right renal calculi.      Finalized by SHAUN BEST, M.D. on 04/18/2023 3:18 PM. Dictated by EVELEEN PATERSON, M.D. on 04/18/2023 3:09 PM.    Results for orders placed during the hospital encounter of 04/21/24    CHEST SINGLE VIEW    Impression  No acute cardiopulmonary abnormality.      Finalized by Duwaine Eagles, M.D. on 04/25/2024 12:28 PM. Dictated by Duwaine Eagles, M.D. on 04/25/2024 12:28 PM.    Assessment and Plan:    Ms. 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  Now failed  and on HD     # Failed allograft.   - on HD MWF      # Immunosuppression:  - Now with failed transplant- continue prograf  0.5mg  BID, off steroid and everolimus .   - No indication to check level.      #. HTN:   Not on antihypertensive medications    # MGUS, IgG kappa  Ongoing follow-up with hematology    # CAD s/p cath 07/03/20    Discharge from post CFT clinic.     Aletha Bring, MD     Cc: Marinda Eaton  Cc: Adrien Eck    Please contact the [facility] at [phone] for any transplant related questions or concerns that may arise.

## 2024-05-08 NOTE — Telephone Encounter
 Critical Lab, call transferred to [facility].

## 2024-05-09 ENCOUNTER — Encounter: Admit: 2024-05-09 | Discharge: 2024-05-09 | Payer: PRIVATE HEALTH INSURANCE

## 2024-05-09 DIAGNOSIS — E1165 Type 2 diabetes mellitus with hyperglycemia: Principal | ICD-10-CM

## 2024-05-10 ENCOUNTER — Ambulatory Visit: Admit: 2024-05-10 | Discharge: 2024-05-11 | Payer: PRIVATE HEALTH INSURANCE

## 2024-05-10 DIAGNOSIS — E1165 Type 2 diabetes mellitus with hyperglycemia: Principal | ICD-10-CM

## 2024-05-10 DIAGNOSIS — N186 End stage renal disease: Secondary | ICD-10-CM

## 2024-05-10 MED ORDER — INSULIN ASPART 100 UNIT/ML SC FLEXPEN
3 refills | 30.00000 days | Status: AC
Start: 2024-05-10 — End: ?

## 2024-05-10 MED ORDER — MOUNJARO 7.5 MG/0.5 ML SC PNIJ
7.5 mg | SUBCUTANEOUS | 3 refills | 28.00000 days | Status: AC
Start: 2024-05-10 — End: ?

## 2024-05-10 MED ORDER — LANTUS SOLOSTAR U-100 INSULIN 100 UNIT/ML (3 ML) SC INPN
50 [IU] | Freq: Every evening | SUBCUTANEOUS | 3 refills | 68.00000 days | Status: AC
Start: 2024-05-10 — End: ?

## 2024-05-10 MED ORDER — DEXCOM G7 SENSOR MISC DEVI
1 | 3 refills | Status: AC
Start: 2024-05-10 — End: ?

## 2024-05-10 NOTE — Progress Notes
 Date of Service: 05/10/2024    Subjective:             Jacqueline Shields is a 58 y.o. female.    History of Present Illness  Jacqueline Shields presents to [facility] at [facility] for management of diabetes mellitus.     Last visit with Randine Mage, APRN in July 2025. This is pt's first visit with me.     Type 2 Diabetes mellitus  Dx: more than 20 years.     A1c was 6.3% 7.2% 01/19/2024.  Previous A1c was 7.5% in 10/13/2023  POC glucose is 228    Interval history:   Jacqueline Shields is a 58 y.o. old female with history of diabetic glomerulosclerosis s/p renal transplant in 2021, type 2 diabetes mellitus on insulin , hypertension, CAD, and MGUS among others.     Mounjaro  has worked well for her, bringing her A1c down.  She is tolerating this without side effects. Dose increased last visit to 7.5 mg weekly.     Was hospitalized 04/21/24-04/30/24 for metabolic acidosis.  Stopped Mounjaro  during that time but plans to restart this weekend.     She reports blood sugars have been running higher since discharge.     Current DM regimen:   Novolog  10 units plus sliding scale with meals - only with evening meal during the week, Tresiba/Lantus  80 units daily  Mounjaro  7.5 mg weekly      Past treatment:   Metformin - stopped years ago - due to CKD    Adherence to medications: some difficulty getting medications.  FSBG frequency: before breakfast and before dinner.  Hyperglycemia: yes  Hypoglycemia: yes - sometimes overnight - down to low 60s, gets sweaty  Hypoglycemia unawareness?: no  Meals per day / Carb intake: 1-2 meals per day, tries to limit her carbohydrates, doesn't count them  Exercise: on her feet at work.  Dyspnea/Chest pain with exertion: no  Last DM education / nutritionist visit: years     Complications of DM:  CAD: yes  CVA: No  PVD: No  Amputations: No  Retinopathy: No/maybe  -- Last Dilated Eye exam 10/27/2023  Gastropathy: No  Nephropathy: yes  Neuropathy: yes  Depression: No  Chronic wounds / delayed healing: No  DM related hospitalizations: No     Works at [employer] in [place] - she has been there 39 years,       Past Medical History:    Abnormal biopsy of kidney    Anemia    Chronic kidney disease    Coronary artery disease due to lipid rich plaque    DM (diabetes mellitus), type 2 (CMS-HCC)    ESRD (end stage renal disease) (CMS-HCC)    Gastroparesis    GERD (gastroesophageal reflux disease)    H/O kidney transplant    HLD (hyperlipidemia)    Hypertension    Kidney failure    Kidney stones    MGUS (monoclonal gammopathy of unknown significance)    Obesity    Proteinuria    PUD (peptic ulcer disease)    PVD (peripheral vascular disease)    Urinary tract infection      Surgical History:   Procedure Laterality Date    ANGIOGRAPHY CORONARY ARTERY WITH LEFT HEART CATHETERIZATION N/A 07/03/2020    Performed by Gupta, Kamal, MD at [facility] CATH LAB    POSSIBLE PERCUTANEOUS CORONARY STENT PLACEMENT WITH ANGIOPLASTY N/A 07/03/2020    Performed by Charlanne Francois, MD at [facility] CATH LAB    ALLOTRANSPLANTATION  KIDNEY FROM NON LIVING DONOR WITHOUT RECIPIENT NEPHRECTOMY N/A 07/15/2020    Performed by Terrie Sieving, MD at [facility] OR    REMOVAL TUNNELED INTRAPERITONEAL CATHETER Bilateral 08/24/2020    Performed by Terrie Sieving, MD at [facility] OR    CYSTOURETHROSCOPY WITH REMOVAL FOREIGN BODY/ CALCULUS/ STENT FROM URETHRA/ BLADDER - SIMPLE Right 08/24/2020    Performed by Carolynn Ruther ORN, MD at [facility] OR    ESOPHAGOGASTRODUODENOSCOPY WITH BIOPSY - FLEXIBLE N/A 04/17/2023    Performed by Chipper Neas, MD at [facility] ENDO    COLONOSCOPY WITH BIOPSY - FLEXIBLE N/A 04/17/2023    Performed by Chipper Neas, MD at [facility] ENDO    ESOPHAGOGASTRODUODENOSCOPY WITH SPECIMEN COLLECTION BY BRUSHING/ WASHING N/A 07/21/2023    Performed by Arlan Nancyann PARAS, MD at [facility] ENDO    ANTEGRADE UROGRAPHY Right 09/22/2023    Performed by Erskin Alm LABOR, MD at [facility] OR    RETROGRADE UROGRAPHY WITH/ WITHOUT KUB Right 09/22/2023    Performed by Erskin Alm LABOR, MD at [facility] OR    REMOVAL, NEPHROSTOMY TUBE, WITH FLUOROSCOPIC GUIDANCE Right 09/22/2023    Performed by Erskin Alm LABOR, MD at [facility] OR    CYSTOURETHROSCOPY WITH URETEROSCOPY WITH TREATMENT URETERAL STRICTURE Right 09/22/2023    Performed by Erskin Alm LABOR, MD at [facility] OR    CYSTOURETHROSCOPY WITH URETEROSCOPY AND/ OR PYELOSCOPY - DIAGNOSTIC Right 11/17/2023    Performed by Erskin Alm LABOR, MD at [facility] OR    RETROGRADE UROGRAPHY WITH/ WITHOUT KUB Right 11/17/2023    Performed by Erskin Alm LABOR, MD at [facility] OR    CYSTOURETHROSCOPY WITH REMOVAL FOREIGN BODY/ CALCULUS/ STENT FROM URETHRA/ BLADDER - SIMPLE Right 11/17/2023    Performed by Erskin Alm LABOR, MD at [facility] OR    CATHETER IMPLANT/REVISION      PD cath    CESAREAN SECTION  1991    HX CESAREAN SECTION  1991    HX HYSTERECTOMY  2000    still has ovaries    HX LITHOTRIPSY      HX TUBAL LIGATION  1995      Review of Systems   Constitutional:  Negative for fatigue and unexpected weight change.   Respiratory:  Negative for shortness of breath.    Cardiovascular:  Negative for palpitations.   Psychiatric/Behavioral:  Positive for sleep disturbance.          Objective:          aspirin  EC (ASPIR-LOW) 81 mg tablet Take one tablet by mouth daily. Take with food.    blood sugar diagnostic (ONETOUCH VERIO TEST STRIPS) test strip Use one strip as directed before meals and at bedtime. ICD-10: Type II Diabetes with unspecified complications; with long term insulin  use E11.8, Z79.4  Indications: type 2 diabetes mellitus    CALCIUM  PO Take 1 tablet by mouth daily.    CHOLEcalciferoL  (vitamin D3) 50 mcg (2,000 unit) capsule TAKE 1 CAPSULE BY MOUTH EVERY DAY    DEXCOM G7 SENSOR sensor device Use one each as directed every 10 days. Indications: type 2 diabetes mellitus    ferrous sulfate  (FEOSOL) 325 mg (65 mg iron ) tablet Take one tablet by mouth at bedtime daily. Take on an empty stomach at least 1 hour before or 2 hours after food. (Patient taking differently: Take one tablet by mouth daily before breakfast. Take on an empty stomach at least 1 hour before or 2 hours after food.)    glucose (DEX4 GLUCOSE) 4 gram chewable tablet Chew four tablets by  mouth as Needed. Indications: low blood sugar    insulin  aspart (U-100) (NOVOLOG  FLEXPEN U-100 INSULIN ) 100 unit/mL (3 mL) PEN Inject 10 units subcutaneously with breakfast, 8 units with lunch, and 10 units with evening meal PLUS sliding scale If blood sugar 151-200: Take extra 2 unit. If blood sugar 201-250: Take extra 4 units. If blood sugar 251-300: Take extra 6 units. If blood sugar is 301-350: Take extra 8 units. If blood sugars > 350: Take extra 10 units. Max daily dose 45 units.  Indications: type 2 diabetes mellitus    insulin  glargine (LANTUS  SOLOSTAR U-100 INSULIN ) 100 unit/mL (3 mL) subcutaneous PEN Inject fifty Units under the skin at bedtime daily. Indications: type 2 diabetes mellitus    lancets 33 gauge (ONETOUCH DELICA 33 GUAGE) 33 gauge Use one each as directed four times daily. ICD-10: Type II Diabetes with unspecified complications; with long term insulin  use E11.8, Z79.4    loperamide  (IMODIUM  A-D) 2 mg capsule Take one capsule by mouth three times daily as needed for Diarrhea. Indications: diarrhea    oxyBUTYnin  chloride (DITROPAN ) 5 mg tablet Take one tablet by mouth twice daily as needed. Indications: a condition where the urge to urinate results in urine leakage    pen needle, diabetic (BD ULTRA-FINE NANO PEN NEEDLE) 32 gauge x 5/32 pen needle Use one pen needle as directed as Needed. Use with insulin  injections.    rosuvastatin  (CRESTOR ) 10 mg tablet Take one tablet by mouth daily. Indications: excessive fat in the blood    tacrolimus  (PROGRAF ) 0.5 mg capsule Take one capsule by mouth twice daily. Indications: prevent kidney transplant rejection, Z94.0    [Paused] tirzepatide  (MOUNJARO ) 7.5 mg/0.5 mL injector PEN Inject 0.5 mL under the skin every 7 days. Indications: type 2 diabetes mellitus     There were no vitals filed for this visit.       There is no height or weight on file to calculate BMI.   BP Readings from Last 4 Encounters:   05/08/24 103/58   04/30/24 92/63   03/20/24 124/66   02/13/24 113/71      Wt Readings from Last 4 Encounters:   05/08/24 90 kg (198 lb 6.4 oz)   04/29/24 85.5 kg (188 lb 7.9 oz)   03/18/24 93.5 kg (206 lb 3.2 oz)   02/13/24 94.3 kg (208 lb)      Comprehensive Metabolic Profile    Lab Results   Component Value Date/Time    NA 143 05/08/2024 09:58 AM    K 4.2 05/08/2024 09:58 AM    CL 105 05/08/2024 09:58 AM    CO2 30 05/08/2024 09:58 AM    GAP 8 05/08/2024 09:58 AM    BUN 19 05/08/2024 09:58 AM    CR 5.28 (H) 05/08/2024 09:58 AM    GLU 217 (H) 05/08/2024 09:58 AM    Lab Results   Component Value Date/Time    CA 8.4 (L) 05/08/2024 09:58 AM    PO4 4.8 (H) 05/08/2024 09:58 AM    ALBUMIN 3.5 05/08/2024 09:58 AM    TOTPROT 7.3 05/08/2024 09:58 AM    ALKPHOS 71 05/08/2024 09:58 AM    AST 25 05/08/2024 09:58 AM    ALT 29 05/08/2024 09:58 AM    TOTBILI 0.4 05/08/2024 09:58 AM    GFR 9 (L) 05/08/2024 09:58 AM    GFRAA 23 (L) 07/03/2020 09:24 AM        No results found for: [facility]ALB24   No results found for: [facility]  No results found for: [facility]    Lab Results   Component Value Date    CHOL 85 01/19/2024    TRIG 148 01/19/2024    HDL 32 (L) 01/19/2024    LDL 23 01/19/2024    VLDL 70.3 01/19/2024    NONHDLCHOL 53 01/19/2024    CHOLHDLC 5 06/29/2020        TSH   Date Value Ref Range Status   06/29/2020 2.98  Final       Hemoglobin A1C   Date Value Ref Range Status   04/21/2024 6.3 (H) 4.0 - 5.7 % Final     Comment:     The ADA recommends that most patients with type 1 and type 2 diabetes maintain an A1c level <7%.   01/19/2024 7.2 (H) 4.0 - 5.7 % Final     Comment:     The ADA recommends that most patients with type 1 and type 2 diabetes maintain an A1c level <7%.   04/13/2023 11.1 (H) 4.0 - 5.7 % Final     Comment:     The ADA recommends that most patients with type 1 and type 2 diabetes maintain   an A1c level <7%.       Poc Hemoglobin A1C   Date Value Ref Range Status   10/13/2023 7.5 (A) 4 - 6 % Final         Lab Results   Component Value Date    PLTCT 220 05/08/2024      Physical Exam  Constitutional:       Appearance: Normal appearance.   Pulmonary:      Effort: Pulmonary effort is normal.   Neurological:      Mental Status: She is alert.   Psychiatric:         Behavior: Behavior normal.          Assessment and Plan:  Diabetes mellitus type 2,  now controlled   A1c 6.3% Sept 2025; 7.2% 01/19/2024 - Now At Goal   Target A1c <7% without significant or frequent hypoglycemia  Currently on Novolog  10 units plus sliding scale with meals (2 units per 50 pts greater than 150) - Tresiba/Lantus  60-80 units daily, Mounjaro  7.5 mg weekly  (off due to hospitalization)   DM Complications:CAD, neuropathy, nephropathy  Assessment of glycemic control: above goal- improving    Plan:  Restart Mounjaro  to 7.5 mg once weekly.  Continue Novolog  10 units plus sliding scale with meals (2 units per 50 pts greater than 150) - only with evening meal during the week, may take 5 units plus sliding school with small meals  Decrease Tresiba/Lantus  50 units daily  when she starts Mounjaro  - would decrease further with low blood sugars.  Discussed rule of 15 and how to tx hypoglycemia  Reminded pt to rotate injection sites  Reminded pt to have medical alert bracelet    Diabetic Co-morbidities - prevention and management:  - Annual labs (electrolytes and renal function): Sept 2025   -- Retinopathy - Annual eye exam: planned for  March 2025-- obtain copies.   - Hypertension: no . Goal BP <130/80.  Not assessed today on TH.   - Dyslipidemia - yes, taking Rosuvastatin  20 mg daily. Last lipid profile: 01/19/2024. Continue.   - Nephropathy: yes. Annual Urine microalbumin/Cr: due now.  Defer due to HD.  ACE/ARB?: no  - Neuropathy: yes .  Monofilament exam (annual): 07/14/2023. Discussed foot care. Need DM shoes: no   - Diabetic Educator and/or nutritionist  visit (annually): declined    BMI 30  Discussed patient's BMI with her.  The body mass index is unknown because there is no height or weight on file. and falls within the category of Obesity 1 (30  to <35); BMI plan is in progress.    CKD on dialysis   Hx of renal transplant   HD TIW.   Diabetes medications limited due to CKD   Okay to resume GLP1/GIP.   Insulin  safe.   May need steroids-- discussed with pt to notify us  if she does as this will impact her DM regimen         Plan of care discussed with patient and patient is agreeable.      Total Time Today was 30 minutes in the following activities: Preparing to see the patient, Performing a medically appropriate examination and/or evaluation, Counseling and educating the patient/family/caregiver, and Documenting clinical information in the electronic or other health record    RTC 3 months                       No orders of the defined types were placed in this encounter.     There are no Patient Instructions on file for this visit.

## 2024-05-13 ENCOUNTER — Ambulatory Visit: Admit: 2024-05-13 | Discharge: 2024-05-13 | Payer: PRIVATE HEALTH INSURANCE

## 2024-05-13 ENCOUNTER — Encounter: Admit: 2024-05-13 | Discharge: 2024-05-13 | Payer: PRIVATE HEALTH INSURANCE

## 2024-05-13 DIAGNOSIS — E1165 Type 2 diabetes mellitus with hyperglycemia: Principal | ICD-10-CM

## 2024-05-13 NOTE — Telephone Encounter
 Called [facility] requested most recent eye exam be sent to this office

## 2024-05-13 NOTE — Telephone Encounter
 Continuous Glucose Monitoring Analysis and Interpretation    Indication for Device Placement: Type 2 Insulin  Dependent Diabetic, and frequent hypoglycemia (50mg ) episodes    Name/Type of Device Placed: Dexcom G6/G7    Continuous glucose monitor downloaded and personally reviewed by me on 05/13/24.    Analysis of Data    Date Range: 14 days    Sensor usage time (%): 92    Average glucose (mg/dL): 764    Coefficient of Variation (%): 35.9    Standard Deviation (mg/dL): 84     Time in range (%): 33    Time above range (%): 31   Time above 250mg /dL (%): 36    Time below range (%): 0   Time below 54mg /dL (%): 0    Assessment  Trends/Patterns observed  Hyperglycemia: global-- worse after meals   Hypoglycemia: none

## 2024-05-13 NOTE — Telephone Encounter
-----   Message from Ulanda JONELLE Melia, APRN-NP sent at 05/10/2024  9:37 AM CDT -----  Please obtain eye exam from [facility] in [place]. Thank you! Around March 2025.

## 2024-05-13 NOTE — Telephone Encounter
 Patient seen on telehealth on 05/10/2024  Encounter created for CGM interpretation

## 2024-05-17 ENCOUNTER — Encounter: Admit: 2024-05-17 | Discharge: 2024-05-17 | Payer: PRIVATE HEALTH INSURANCE

## 2024-05-21 ENCOUNTER — Encounter: Admit: 2024-05-21 | Discharge: 2024-05-21 | Payer: PRIVATE HEALTH INSURANCE

## 2024-05-23 ENCOUNTER — Encounter: Admit: 2024-05-23 | Discharge: 2024-05-23 | Payer: PRIVATE HEALTH INSURANCE

## 2024-05-24 ENCOUNTER — Encounter: Admit: 2024-05-24 | Discharge: 2024-05-24 | Payer: PRIVATE HEALTH INSURANCE

## 2024-05-24 NOTE — Progress Notes [1]
 Contacted Jacqueline Shields to refill their medication(s) TACROLIMUS  0.5 MG PO CAP.    We were unable to reach the patient after three attempts. At this time, we will no longer attempt to contact the patient for the refill. The patient may be re-enrolled in the pharmacy refill management program at any time by contacting the pharmacy or refilling their medication. Ambulatory pharmacist notified.      [facility]  [phone]

## 2024-05-27 ENCOUNTER — Encounter: Admit: 2024-05-27 | Discharge: 2024-05-27 | Payer: PRIVATE HEALTH INSURANCE

## 2024-05-27 MED FILL — TACROLIMUS 0.5 MG PO CAP: 0.5 mg | ORAL | 90 days supply | Qty: 180 | Fill #0 | Status: AC

## 2024-05-30 ENCOUNTER — Encounter: Admit: 2024-05-30 | Discharge: 2024-05-30 | Payer: PRIVATE HEALTH INSURANCE

## 2024-05-31 ENCOUNTER — Encounter: Admit: 2024-05-31 | Discharge: 2024-05-31 | Payer: PRIVATE HEALTH INSURANCE

## 2024-05-31 DIAGNOSIS — Z01818 Encounter for other preprocedural examination: Principal | ICD-10-CM

## 2024-06-21 ENCOUNTER — Encounter: Admit: 2024-06-21 | Discharge: 2024-06-21 | Payer: PRIVATE HEALTH INSURANCE

## 2024-06-24 ENCOUNTER — Encounter: Admit: 2024-06-24 | Discharge: 2024-06-24 | Payer: PRIVATE HEALTH INSURANCE

## 2024-06-24 NOTE — Progress Notes [1]
 Per SW call to [place], PD RN, patient is adherent and just came off PD training last week.  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  She is currently on cipro  for a UTI and is having issues with her PD drain.      Almarie Blackwater, LMSW, [place]-C

## 2024-06-25 ENCOUNTER — Encounter: Admit: 2024-06-25 | Discharge: 2024-06-25 | Payer: PRIVATE HEALTH INSURANCE

## 2024-06-30 ENCOUNTER — Encounter: Admit: 2024-06-30 | Discharge: 2024-06-30 | Payer: PRIVATE HEALTH INSURANCE

## 2024-07-01 ENCOUNTER — Ambulatory Visit: Admit: 2024-07-01 | Discharge: 2024-07-01 | Payer: PRIVATE HEALTH INSURANCE

## 2024-07-01 ENCOUNTER — Encounter: Admit: 2024-07-01 | Discharge: 2024-07-01 | Payer: PRIVATE HEALTH INSURANCE

## 2024-07-01 ENCOUNTER — Ambulatory Visit: Admit: 2024-07-01 | Discharge: 2024-07-02 | Payer: PRIVATE HEALTH INSURANCE

## 2024-07-01 NOTE — Progress Notes [1]
 Confirmed New  K Eval through [facility]

## 2024-07-01 NOTE — Progress Notes [1]
 SOCIAL WORK PSYCHOSOCIAL ASSESSMENT        Name:  Joel Mericle #: [id number]     Date: 07/01/2024     Referral Source:  Renal Transplant Team  Referral Reason: Psychosocial evaluation  Date Referred:  07/01/2024  Source of Information: Patient                               Patient's Address:  [address] [place] [address]                                    Date(s) Interviewed:  07/01/2024    Telephone #:  [phone] (home)                                                                                1.  PRESENT SITUATION:                Sex: female          Age: 58 y.o.          Birthdate: 13-Jan-1966  Primary Language:  Isadora   Ethnic Background:  Non Hispanic   Religion:  (Optional)    Diagnosis:   ESRD  Statement of Presenting Problem(s):  Patient is a 58 y.o. year old who presents for evaluation for kidney transplant. She is accompanied by her spouse, Kenyana Husak. The SW eval was initially completed via TH (due to inclement weather) and then via phone with audio went out. 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 She returned to dialysis 04/2024 and is currently completing PD through [facility]. She is using a nightly cycler with a 9 hour runtime. She denies missing or shortening her treatments. She drives and usually attends her medical appointments with her sister Macario. She has a past medical history of GERD, CAD, gastroparesis, MGUS, PVD, and UTIs. She had a PD cath placement on 10/24. She has a Dexcom CGM to monitor her DM. Her hemoglobin A1c was 6.3 on 04/21/24. She denies any falls related to low sugars and takes Novolog , Lantus  and Mounjaro . She continues to follow with Dr. Aletha Bring for post transplant follow up care.     06/24/24: SW E. Haire confirmed dialysis compliance: Per SW call to [place], PD RN, patient is adherent and just came off PD training last week.  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  She is currently on cipro  for a UTI and is having issues with her PD drain.        2.  PATIENT'S LIVING SITUATION PRIOR TO ADMISSION    Citizenship: [place]  Citizen   Household: Spouse (Roger), 1 dog    Comments:  Patient resides in [place], [place] with her spouse. They own their one story home. She can complete stairs as needed however states she them slowly.  She has never received any home health care services or had a stay in a SNF, rehab or LTACH.  She is independent with ambulation and with ADL's. She was born and raised in the [place] area.     3.  FINANCIAL RESOURCES    Income: Full time employment, currently on FMLA with STD plan  Primary Insurance: [employer] employer plan  Secondary Insurance:  NA  Eligible for Medicare based on disability: Eligible based on PD    Comments:    Patient is employed full time at [employer] [place] She has been employed there for over 40 years. She is currently on FMLA due to dialysis start. She thinks her time will end soon. She has also applied for SSDI last month in case she cannot return to work. Discussed that patient will need to plan for 6-12 weeks off from work, post-transplant.  Her spouse also works at [employer].     Patient has [employer] employer insurance coverage through her spouse. Premiums are funded via paycheck.   Patient verbalized understanding of the limitations of Medicare (36 months post transplant, barring on-going SSDI eligibility) and of SSDI (limited to as little as one year post-transplant barring on-going disability) after a successful transplant.  She previously had Medicare prior to first transplant. Patient uses the [facility] [place] pharmacy. She denies any financial barriers related to medication access or to going without medications.  She is not receiving any sources of financial assistance with her daily living expenses and denies any financial barriers to meeting his health or daily living needs. SW defers to TFA to ensure adequate insurance coverage and financial plan post-transplant.    4.  SUPPORTIVE RELATIONSHIPS/ COMMUNITY RESOURCES    Caregivers:  Spouse Cynthea Zachman [phone]  Sister Macario - need to obtain number     Living Donors:  Not asked     Comments: Pt has been married to spouse, Sharolyn, for over 40 years. They have 2 daughters: [place]  and [place] . She has 4 grandchildren with another on the way. She has a brother in [place] and a sister in [place]. Her spouse will be her main support. He works at [employer] with time off options and drives. Her sister, Macario, will be additional support. She lives in [place], is on SSDI, drives, and already accompanies her to medical appointments as needed. Her spouse will accompany her to the hospital at the time of surgery.       Patient and their support have been provided education regarding the transplant process and expectations during the RN education class this date. SW discussed that patient may be required to stay locally for a time post-transplant along with their support and provided patient with the area lodging list and with the [employer] fundraising brochure for her reference.  Patient and her caregiver voiced understanding that they are expected to return to clinic once a week for at least the first four to six weeks following discharge from transplant. The frequency of these visits may vary at the discretion of the transplant team. Patients will have labs drawn two times per week for the first three months unless otherwise notified by the transplantteam. SW explained that a support person should be present during the transplant surgery, to provide emotional support during the transplant hospitalization, for hospital discharge/medication teaching, as well as to transport and accompany patient to her follow-up visits.  Additionally, discussed that patient may require assistance with home/chore needs, errands, as well as medication management (maintenance of pill box and dose changes).  SW is not able to assess the ability of the pt or support  to manage medications. SW will defer to the clinical team. Patient and her caregiver voiced understanding that all transportation/lodging and follow-up expenses are the patient's financial responsibility.  Patient also acknowledged that she will have driving restrictions during her post-transplant recovery.     5. COGNITIVE ABILITIES    Highest Level of Education: 12th grade    Comments: She denies having any history of medication non-adherence. She uses a pillbox to manage her medications which she completes on her own. She denies missing any doses. She continues to take tacrolimus  from her previous kidney transplant.  She voiced understanding of the need to take immunosuppressive medications for the life of the transplant. SW will defer to clinical team on the ability of patient/support managing pt's medications post.  She denies having any history of tobacco use, substance use or of ETOH abuse.  She admits to feel down at times due to her health condition. She denies it interfering with her ADLs. SW dicussed MH resources and Furniture Conservator/restorer. She decline both and denies SI and HI.  She denies any pending legal issues.  She does have a DPOA-HC or Advanced Directive in place at this time, and on file. She named her spouse Sharolyn and daughter Bernardino as her research officer, political party.     Patient enjoys coloring on her phone and sitting on the porch when the weather is nice.     6.  SOCIAL WORK EVALUATION    Comments:  SW met with patient and her spouse Allina Riches as part of her evaluation for kidney transplant.  She has health insurance coverage through [facility] SW defers to TFA to ensure adequate insurance coverage and financial plan post-transplant.  She receives support from her spouse who will serve as her primary caregiver post transplant.  She will have backup support through sister Macario.  She has reasonable expectations for the transplant process.  She has no reported history of tobacco use, substance use or of ETOH abuse.  She endorses feeling down at times. She denies SI, HI, or the need for resources. She was forward thinking and motivated during the evaluation.  Patient and caregiver deny any barriers to completing the post-transplant follow-up as required and they voiced understanding of the life-long post transplant immunosuppressant medication regimen.    Social Work Impression/Recommendation:  SW identified the following psychosocial recommendations for kidney transplant listing: Monitor mental health.     Patient's candidacy for kidney transplant will be reviewed by the selection committee for final determination.      7.  PLAN    Plan Discussed with Patient / Family:  {PLAN DISCUSSED, AGREED UPON  Plan Agreed Upon with Patient / Family:  {PLAN DISCUSSED, AGREED UPON      Patient was provided with the Renal Transplant Patient Responsibilities to be reviewed, signed and returned to the transplant team.    Larraine Lobstein, LMSW, NSW-C

## 2024-07-03 ENCOUNTER — Encounter: Admit: 2024-07-03 | Discharge: 2024-07-03 | Payer: PRIVATE HEALTH INSURANCE

## 2024-07-03 NOTE — Committee Review [43]
 Committee Review Note     Evaluation Date: 07/01/2024  Committee Review Date: 07/03/2024    Organ being evaluated for: Kidney    Transplant Phase: Evaluation   Transplant Status:  Active     Transplant Coordinator: Lauraine Like  Transplant Surgeon:        Referring Physician: Aletha Bring    Primary Diagnosis: Diabetes Mellitus - Type II  Secondary Diagnosis:     Committee Review Members:  Dietitian, Registered Jon Radar, RD   Financial Counseling and Assistance Services [facility] [place] [facility]   Nephrology Ian Bring, MD, Loa CHRISTELLA Dubonnet, MD, Reyes DELENA Sage, MD, Derinda Alas, MD, Aletha Bring, MD, Vaughan GORMAN Ned, APRN-NP   Pharmacist Rexhian Brisku, [place]   Psychiatry Alm Clubs, MD   Social Worker, Clinical [facility]   Transplant Services Maurilio Plenty, RN, Rolland Pizza, RN, Clarita Gins, RN, Joesph Herrlich, RN, Mylinda Burnet, RN, Lauraine Like, RN   Transplant Surgery Juliene JAYSON Nick, MD, Toribio Ok, MD       Transplant Eligibility: Adults age 59 or older.  A minor may be considered on an individual basis, End Stage Renal Disease as defined by CrCl or GFR of less than or equal to 20 mL/min; or currently on dialysis    Committee Review Decision: Deferred    Relative Contraindications: Echo, stress, cardiac risk stratification, low threshold for LHC, BP control, update mammogram    Absolute Contraindications:     Committee Discussion Details: Deferred    Action Items: Echo, stress, cardiac risk stratification, low threshold for LHC, BP control, update mammogram,     Transplant coordinator to request: Reach out to quality to see if mate kidney is working    Patient accepting KDPI > 85%:  Yes.    cPRA: pending. 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    Is this patient identified as High Risk Cardiac: No    Does patient need to return to committee?: Yes Reason: Upon completion of action items/make active     Discussion:   Nephrology: ESRD d/t DM. Started HD in 2019. Received kidney tx here in 07/2020. A1c was 9.5% when transplanted. DM not well controlled. Kidney function was not good. Graft failed, re-started dialysis 04/2024. No rejection noted on biopsy. No living donors. Needs cards risk stratification, echo, stress, low threshold for LHC. PAP smear negative 2021. Colonoscopy UTD. May need close monitoring post tx.   Surgery: Reasonable candidate.   Social Work: Monitor depression. Manages depression on her own.   Pharmacist: Started on Midodrine. Pt does not feel like it is helping. Potentially needs to increase. Consider rapid pred taper d/t DM. Initially start MPA and then switch to Everolimus .   Dietician: Poor health literacy. Hx of kidney stones.   TFA: No FAP needed, written auth needed, no financial concerns

## 2024-07-05 ENCOUNTER — Encounter: Admit: 2024-07-05 | Discharge: 2024-07-05 | Payer: PRIVATE HEALTH INSURANCE

## 2024-07-05 DIAGNOSIS — Z01818 Encounter for other preprocedural examination: Secondary | ICD-10-CM

## 2024-07-05 DIAGNOSIS — N186 End stage renal disease: Principal | ICD-10-CM

## 2024-07-05 NOTE — Telephone Encounter [36]
 Pt left voicemail requesting call back.     Pt would like echo and stress to be done closer to home at [facility]. Will fax orders to [facility]. Pt okay with seeing [facility] [place] cardiology. Pt will reach out to PCP to talk about mammogram and has upcoming appt to discuss BP and Midodrine. Pt had not further questions.

## 2024-07-08 ENCOUNTER — Encounter: Admit: 2024-07-08 | Discharge: 2024-07-08 | Payer: PRIVATE HEALTH INSURANCE

## 2024-07-10 ENCOUNTER — Encounter: Admit: 2024-07-10 | Discharge: 2024-07-10 | Payer: PRIVATE HEALTH INSURANCE

## 2024-07-11 ENCOUNTER — Ambulatory Visit: Admit: 2024-07-11 | Discharge: 2024-07-12 | Payer: PRIVATE HEALTH INSURANCE

## 2024-07-11 ENCOUNTER — Encounter: Admit: 2024-07-11 | Discharge: 2024-07-11 | Payer: PRIVATE HEALTH INSURANCE

## 2024-07-16 ENCOUNTER — Encounter: Admit: 2024-07-16 | Discharge: 2024-07-16 | Payer: PRIVATE HEALTH INSURANCE

## 2024-07-18 ENCOUNTER — Encounter: Admit: 2024-07-18 | Discharge: 2024-07-18 | Payer: PRIVATE HEALTH INSURANCE

## 2024-07-18 ENCOUNTER — Ambulatory Visit: Admit: 2024-07-18 | Discharge: 2024-07-18 | Payer: PRIVATE HEALTH INSURANCE

## 2024-07-19 ENCOUNTER — Encounter: Admit: 2024-07-19 | Discharge: 2024-07-19 | Payer: PRIVATE HEALTH INSURANCE

## 2024-07-22 ENCOUNTER — Encounter: Admit: 2024-07-22 | Discharge: 2024-07-22 | Payer: PRIVATE HEALTH INSURANCE

## 2024-07-22 DIAGNOSIS — C9000 Multiple myeloma not having achieved remission: Principal | ICD-10-CM

## 2024-07-22 DIAGNOSIS — D472 Monoclonal gammopathy: Secondary | ICD-10-CM

## 2024-07-23 ENCOUNTER — Encounter: Admit: 2024-07-23 | Discharge: 2024-07-23 | Payer: PRIVATE HEALTH INSURANCE

## 2024-07-23 ENCOUNTER — Ambulatory Visit: Admit: 2024-07-23 | Discharge: 2024-07-24 | Payer: PRIVATE HEALTH INSURANCE

## 2024-07-23 NOTE — Telephone Encounter [36]
 12/23 - Per WQ Task, No cardiac records to request.    Thanks Kindly!  Luke BECKER  HIM Specialist - Cardiovascular Medicine  [facility]  [address],  [place], [place]  [address]  [phone]

## 2024-08-04 ENCOUNTER — Encounter: Admit: 2024-08-04 | Discharge: 2024-08-04 | Payer: PRIVATE HEALTH INSURANCE

## 2024-08-06 ENCOUNTER — Encounter: Admit: 2024-08-06 | Discharge: 2024-08-06 | Payer: PRIVATE HEALTH INSURANCE

## 2024-08-07 ENCOUNTER — Encounter: Admit: 2024-08-07 | Discharge: 2024-08-07 | Payer: PRIVATE HEALTH INSURANCE

## 2024-08-07 NOTE — Telephone Encounter [36]
 Chart review complete. Pt completed echo and stress. Pt seen by urology, will need urodynamic studies and was taught to self cath d/t post void residual. 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 Urodynamic study scheduled for 01/15, oncology scheduled for 01/20, and cardiology scheduled for 02/24. Reminders set to f/u. [facility] sent to patient mammogram scheduling.

## 2024-08-09 ENCOUNTER — Encounter: Admit: 2024-08-09 | Discharge: 2024-08-09 | Payer: PRIVATE HEALTH INSURANCE

## 2024-08-14 ENCOUNTER — Encounter: Admit: 2024-08-14 | Discharge: 2024-08-14 | Payer: PRIVATE HEALTH INSURANCE

## 2024-08-15 ENCOUNTER — Encounter: Admit: 2024-08-15 | Discharge: 2024-08-15 | Payer: PRIVATE HEALTH INSURANCE

## 2024-08-18 ENCOUNTER — Encounter: Admit: 2024-08-18 | Discharge: 2024-08-18 | Payer: PRIVATE HEALTH INSURANCE

## 2024-08-19 ENCOUNTER — Encounter: Admit: 2024-08-19 | Discharge: 2024-08-19 | Payer: PRIVATE HEALTH INSURANCE

## 2024-08-20 ENCOUNTER — Encounter: Admit: 2024-08-20 | Discharge: 2024-08-20 | Payer: PRIVATE HEALTH INSURANCE

## 2024-08-20 NOTE — Progress Notes [1]
 Opened in error

## 2024-08-21 ENCOUNTER — Encounter: Admit: 2024-08-21 | Discharge: 2024-08-21 | Payer: PRIVATE HEALTH INSURANCE

## 2024-08-24 ENCOUNTER — Encounter: Admit: 2024-08-24 | Discharge: 2024-08-24 | Payer: PRIVATE HEALTH INSURANCE

## 2024-08-26 ENCOUNTER — Encounter: Admit: 2024-08-26 | Discharge: 2024-08-26 | Payer: PRIVATE HEALTH INSURANCE

## 2024-08-27 ENCOUNTER — Encounter: Admit: 2024-08-27 | Discharge: 2024-08-27 | Payer: PRIVATE HEALTH INSURANCE

## 2024-08-28 ENCOUNTER — Encounter: Admit: 2024-08-28 | Discharge: 2024-08-28 | Payer: PRIVATE HEALTH INSURANCE

## 2024-08-29 ENCOUNTER — Encounter: Admit: 2024-08-29 | Discharge: 2024-08-29 | Payer: PRIVATE HEALTH INSURANCE

## 2024-08-30 ENCOUNTER — Encounter: Admit: 2024-08-30 | Discharge: 2024-08-30 | Payer: PRIVATE HEALTH INSURANCE

## 2024-08-30 MED FILL — TACROLIMUS 0.5 MG PO CAP: 0.5 mg | ORAL | 90 days supply | Qty: 180 | Fill #1 | Status: AC

## 2024-09-04 ENCOUNTER — Encounter: Admit: 2024-09-04 | Discharge: 2024-09-04 | Payer: PRIVATE HEALTH INSURANCE
